# Patient Record
Sex: Female | Born: 1935 | Race: White | Hispanic: No | Marital: Single | Smoking: Never smoker
Health system: Southern US, Community
[De-identification: ages and names within clinical notes are randomized; demographics above are authoritative.]

## PROBLEM LIST (undated history)

## (undated) DIAGNOSIS — Z8489 Family history of other specified conditions: Secondary | ICD-10-CM

## (undated) DIAGNOSIS — F0390 Unspecified dementia without behavioral disturbance: Secondary | ICD-10-CM

## (undated) DIAGNOSIS — M1990 Unspecified osteoarthritis, unspecified site: Secondary | ICD-10-CM

## (undated) DIAGNOSIS — I82409 Acute embolism and thrombosis of unspecified deep veins of unspecified lower extremity: Secondary | ICD-10-CM

## (undated) DIAGNOSIS — I340 Nonrheumatic mitral (valve) insufficiency: Secondary | ICD-10-CM

## (undated) DIAGNOSIS — R112 Nausea with vomiting, unspecified: Secondary | ICD-10-CM

## (undated) DIAGNOSIS — C801 Malignant (primary) neoplasm, unspecified: Secondary | ICD-10-CM

## (undated) DIAGNOSIS — S32010A Wedge compression fracture of first lumbar vertebra, initial encounter for closed fracture: Secondary | ICD-10-CM

## (undated) DIAGNOSIS — K219 Gastro-esophageal reflux disease without esophagitis: Secondary | ICD-10-CM

## (undated) DIAGNOSIS — H3530 Unspecified macular degeneration: Secondary | ICD-10-CM

## (undated) DIAGNOSIS — I350 Nonrheumatic aortic (valve) stenosis: Secondary | ICD-10-CM

## (undated) DIAGNOSIS — S42409A Unspecified fracture of lower end of unspecified humerus, initial encounter for closed fracture: Secondary | ICD-10-CM

## (undated) DIAGNOSIS — Z9889 Other specified postprocedural states: Secondary | ICD-10-CM

## (undated) HISTORY — PX: OTHER SURGICAL HISTORY: SHX169

## (undated) HISTORY — PX: FRACTURE SURGERY: SHX138

## (undated) HISTORY — PX: HIP SURGERY: SHX245

## (undated) HISTORY — PX: COLONOSCOPY W/ BIOPSIES AND POLYPECTOMY: SHX1376

---

## 2001-06-03 ENCOUNTER — Encounter: Payer: Self-pay | Admitting: Family Medicine

## 2001-06-03 ENCOUNTER — Encounter: Admission: RE | Admit: 2001-06-03 | Discharge: 2001-06-03 | Payer: Self-pay | Admitting: Family Medicine

## 2001-06-29 ENCOUNTER — Other Ambulatory Visit: Admission: RE | Admit: 2001-06-29 | Discharge: 2001-06-29 | Payer: Self-pay | Admitting: Family Medicine

## 2007-12-28 ENCOUNTER — Encounter: Admission: RE | Admit: 2007-12-28 | Discharge: 2007-12-28 | Payer: Self-pay | Admitting: Family Medicine

## 2007-12-29 ENCOUNTER — Encounter: Admission: RE | Admit: 2007-12-29 | Discharge: 2007-12-29 | Payer: Self-pay | Admitting: Family Medicine

## 2008-09-14 ENCOUNTER — Emergency Department (HOSPITAL_COMMUNITY): Admission: EM | Admit: 2008-09-14 | Discharge: 2008-09-14 | Payer: Self-pay | Admitting: Emergency Medicine

## 2010-06-01 ENCOUNTER — Emergency Department (HOSPITAL_COMMUNITY)
Admission: EM | Admit: 2010-06-01 | Discharge: 2010-06-01 | Payer: Self-pay | Source: Home / Self Care | Admitting: Family Medicine

## 2010-08-21 LAB — POCT I-STAT, CHEM 8
Creatinine, Ser: 0.9 mg/dL (ref 0.4–1.2)
HCT: 41 % (ref 36.0–46.0)
Hemoglobin: 13.9 g/dL (ref 12.0–15.0)
Potassium: 3.5 mEq/L (ref 3.5–5.1)
Sodium: 139 mEq/L (ref 135–145)
TCO2: 23 mmol/L (ref 0–100)

## 2010-08-21 LAB — DIFFERENTIAL
Eosinophils Relative: 0 % (ref 0–5)
Lymphocytes Relative: 9 % — ABNORMAL LOW (ref 12–46)
Lymphs Abs: 0.9 10*3/uL (ref 0.7–4.0)
Monocytes Absolute: 0.5 10*3/uL (ref 0.1–1.0)

## 2010-08-21 LAB — CBC
MCHC: 34.5 g/dL (ref 30.0–36.0)
MCV: 90.8 fL (ref 78.0–100.0)
MCV: 90.9 fL (ref 78.0–100.0)
Platelets: 224 10*3/uL (ref 150–400)
Platelets: 246 10*3/uL (ref 150–400)
RDW: 12.5 % (ref 11.5–15.5)
WBC: 9.5 10*3/uL (ref 4.0–10.5)

## 2010-08-21 LAB — COMPREHENSIVE METABOLIC PANEL
ALT: 110 U/L — ABNORMAL HIGH (ref 0–35)
AST: 77 U/L — ABNORMAL HIGH (ref 0–37)
Albumin: 4 g/dL (ref 3.5–5.2)
Calcium: 8.8 mg/dL (ref 8.4–10.5)
Chloride: 103 mEq/L (ref 96–112)
Creatinine, Ser: 0.72 mg/dL (ref 0.4–1.2)
GFR calc Af Amer: >60 mL/min (ref >60)
Sodium: 138 mEq/L (ref 135–145)
Total Bilirubin: 0.6 mg/dL (ref 0.3–1.2)

## 2010-08-21 LAB — URINALYSIS, ROUTINE W REFLEX MICROSCOPIC
Hgb urine dipstick: NEGATIVE
Nitrite: NEGATIVE
Protein, ur: NEGATIVE mg/dL
Specific Gravity, Urine: 1.019 (ref 1.005–1.030)
Urobilinogen, UA: 0.2 mg/dL (ref 0.0–1.0)

## 2010-08-21 LAB — POCT CARDIAC MARKERS: Troponin i, poc: <0.05 ng/mL (ref 0.00–0.09)

## 2011-02-20 ENCOUNTER — Ambulatory Visit (INDEPENDENT_AMBULATORY_CARE_PROVIDER_SITE_OTHER): Payer: Self-pay | Admitting: Ophthalmology

## 2011-09-30 ENCOUNTER — Encounter (INDEPENDENT_AMBULATORY_CARE_PROVIDER_SITE_OTHER): Payer: Medicare Other | Admitting: Ophthalmology

## 2011-09-30 DIAGNOSIS — H33309 Unspecified retinal break, unspecified eye: Secondary | ICD-10-CM | POA: Diagnosis not present

## 2011-09-30 DIAGNOSIS — H2510 Age-related nuclear cataract, unspecified eye: Secondary | ICD-10-CM | POA: Diagnosis not present

## 2011-09-30 DIAGNOSIS — H43819 Vitreous degeneration, unspecified eye: Secondary | ICD-10-CM | POA: Diagnosis not present

## 2011-09-30 DIAGNOSIS — H40019 Open angle with borderline findings, low risk, unspecified eye: Secondary | ICD-10-CM

## 2012-01-22 DIAGNOSIS — T148XXA Other injury of unspecified body region, initial encounter: Secondary | ICD-10-CM | POA: Diagnosis not present

## 2012-03-06 DIAGNOSIS — Z1331 Encounter for screening for depression: Secondary | ICD-10-CM | POA: Diagnosis not present

## 2012-03-06 DIAGNOSIS — M940 Chondrocostal junction syndrome [Tietze]: Secondary | ICD-10-CM | POA: Diagnosis not present

## 2012-03-06 DIAGNOSIS — Z23 Encounter for immunization: Secondary | ICD-10-CM | POA: Diagnosis not present

## 2012-04-01 ENCOUNTER — Ambulatory Visit (INDEPENDENT_AMBULATORY_CARE_PROVIDER_SITE_OTHER): Payer: Medicare Other | Admitting: Ophthalmology

## 2012-04-01 DIAGNOSIS — H3530 Unspecified macular degeneration: Secondary | ICD-10-CM

## 2012-04-01 DIAGNOSIS — H33309 Unspecified retinal break, unspecified eye: Secondary | ICD-10-CM

## 2012-04-01 DIAGNOSIS — H40019 Open angle with borderline findings, low risk, unspecified eye: Secondary | ICD-10-CM

## 2012-04-02 DIAGNOSIS — H40019 Open angle with borderline findings, low risk, unspecified eye: Secondary | ICD-10-CM | POA: Diagnosis not present

## 2012-04-02 DIAGNOSIS — H3530 Unspecified macular degeneration: Secondary | ICD-10-CM | POA: Diagnosis not present

## 2012-04-02 DIAGNOSIS — H2510 Age-related nuclear cataract, unspecified eye: Secondary | ICD-10-CM | POA: Diagnosis not present

## 2012-04-02 DIAGNOSIS — H04129 Dry eye syndrome of unspecified lacrimal gland: Secondary | ICD-10-CM | POA: Diagnosis not present

## 2012-04-13 DIAGNOSIS — R071 Chest pain on breathing: Secondary | ICD-10-CM | POA: Diagnosis not present

## 2012-07-02 DIAGNOSIS — H3530 Unspecified macular degeneration: Secondary | ICD-10-CM | POA: Diagnosis not present

## 2012-07-02 DIAGNOSIS — H2510 Age-related nuclear cataract, unspecified eye: Secondary | ICD-10-CM | POA: Diagnosis not present

## 2012-07-02 DIAGNOSIS — H04129 Dry eye syndrome of unspecified lacrimal gland: Secondary | ICD-10-CM | POA: Diagnosis not present

## 2012-07-02 DIAGNOSIS — H40019 Open angle with borderline findings, low risk, unspecified eye: Secondary | ICD-10-CM | POA: Diagnosis not present

## 2012-12-18 ENCOUNTER — Other Ambulatory Visit: Payer: Self-pay | Admitting: Family Medicine

## 2012-12-18 ENCOUNTER — Ambulatory Visit
Admission: RE | Admit: 2012-12-18 | Discharge: 2012-12-18 | Disposition: A | Discharge status: Home / Self Care | Payer: Medicare Other | Source: Ambulatory Visit | Attending: Family Medicine | Admitting: Family Medicine

## 2012-12-18 DIAGNOSIS — D239 Other benign neoplasm of skin, unspecified: Secondary | ICD-10-CM | POA: Diagnosis not present

## 2012-12-18 DIAGNOSIS — R634 Abnormal weight loss: Secondary | ICD-10-CM

## 2012-12-18 DIAGNOSIS — R630 Anorexia: Secondary | ICD-10-CM

## 2012-12-18 DIAGNOSIS — Z136 Encounter for screening for cardiovascular disorders: Secondary | ICD-10-CM | POA: Diagnosis not present

## 2012-12-18 DIAGNOSIS — Z23 Encounter for immunization: Secondary | ICD-10-CM | POA: Diagnosis not present

## 2012-12-18 DIAGNOSIS — J449 Chronic obstructive pulmonary disease, unspecified: Secondary | ICD-10-CM | POA: Diagnosis not present

## 2012-12-29 DIAGNOSIS — H40019 Open angle with borderline findings, low risk, unspecified eye: Secondary | ICD-10-CM | POA: Diagnosis not present

## 2012-12-29 DIAGNOSIS — H353190 Nonexudative age-related macular degeneration, unspecified eye, stage unspecified: Secondary | ICD-10-CM | POA: Diagnosis not present

## 2012-12-29 DIAGNOSIS — H2589 Other age-related cataract: Secondary | ICD-10-CM | POA: Diagnosis not present

## 2013-01-04 DIAGNOSIS — R634 Abnormal weight loss: Secondary | ICD-10-CM | POA: Diagnosis not present

## 2013-02-08 DIAGNOSIS — H269 Unspecified cataract: Secondary | ICD-10-CM | POA: Diagnosis not present

## 2013-02-08 DIAGNOSIS — H2589 Other age-related cataract: Secondary | ICD-10-CM | POA: Diagnosis not present

## 2013-02-08 DIAGNOSIS — H3530 Unspecified macular degeneration: Secondary | ICD-10-CM | POA: Diagnosis not present

## 2013-05-31 DIAGNOSIS — L570 Actinic keratosis: Secondary | ICD-10-CM | POA: Diagnosis not present

## 2013-05-31 DIAGNOSIS — B079 Viral wart, unspecified: Secondary | ICD-10-CM | POA: Diagnosis not present

## 2013-08-06 DIAGNOSIS — M549 Dorsalgia, unspecified: Secondary | ICD-10-CM | POA: Diagnosis not present

## 2013-08-24 DIAGNOSIS — R030 Elevated blood-pressure reading, without diagnosis of hypertension: Secondary | ICD-10-CM | POA: Diagnosis not present

## 2013-08-24 DIAGNOSIS — Z0000 Encounter for general adult medical examination without abnormal findings: Secondary | ICD-10-CM | POA: Diagnosis not present

## 2013-08-24 DIAGNOSIS — Z23 Encounter for immunization: Secondary | ICD-10-CM | POA: Diagnosis not present

## 2013-12-04 ENCOUNTER — Emergency Department (HOSPITAL_COMMUNITY): Payer: Medicare Other

## 2013-12-04 ENCOUNTER — Inpatient Hospital Stay (HOSPITAL_COMMUNITY)
Admission: EM | Admit: 2013-12-04 | Discharge: 2013-12-09 | DRG: 500 | Disposition: A | Discharge status: Rehabilitation Facility | Payer: Medicare Other | Attending: Internal Medicine | Admitting: Internal Medicine

## 2013-12-04 ENCOUNTER — Encounter (HOSPITAL_COMMUNITY): Payer: Self-pay | Admitting: Emergency Medicine

## 2013-12-04 DIAGNOSIS — M25559 Pain in unspecified hip: Secondary | ICD-10-CM | POA: Diagnosis not present

## 2013-12-04 DIAGNOSIS — S72033A Displaced midcervical fracture of unspecified femur, initial encounter for closed fracture: Secondary | ICD-10-CM | POA: Diagnosis not present

## 2013-12-04 DIAGNOSIS — Z79899 Other long term (current) drug therapy: Secondary | ICD-10-CM | POA: Diagnosis not present

## 2013-12-04 DIAGNOSIS — Z7982 Long term (current) use of aspirin: Secondary | ICD-10-CM | POA: Diagnosis not present

## 2013-12-04 DIAGNOSIS — S52201A Unspecified fracture of shaft of right ulna, initial encounter for closed fracture: Secondary | ICD-10-CM

## 2013-12-04 DIAGNOSIS — S52021A Displaced fracture of olecranon process without intraarticular extension of right ulna, initial encounter for closed fracture: Secondary | ICD-10-CM

## 2013-12-04 DIAGNOSIS — M545 Low back pain, unspecified: Secondary | ICD-10-CM | POA: Diagnosis not present

## 2013-12-04 DIAGNOSIS — Z8249 Family history of ischemic heart disease and other diseases of the circulatory system: Secondary | ICD-10-CM | POA: Diagnosis not present

## 2013-12-04 DIAGNOSIS — T07XXXA Unspecified multiple injuries, initial encounter: Secondary | ICD-10-CM

## 2013-12-04 DIAGNOSIS — S0003XA Contusion of scalp, initial encounter: Secondary | ICD-10-CM | POA: Diagnosis not present

## 2013-12-04 DIAGNOSIS — M79609 Pain in unspecified limb: Secondary | ICD-10-CM | POA: Diagnosis not present

## 2013-12-04 DIAGNOSIS — T1490XA Injury, unspecified, initial encounter: Secondary | ICD-10-CM | POA: Diagnosis not present

## 2013-12-04 DIAGNOSIS — R9431 Abnormal electrocardiogram [ECG] [EKG]: Secondary | ICD-10-CM | POA: Diagnosis not present

## 2013-12-04 DIAGNOSIS — S79919A Unspecified injury of unspecified hip, initial encounter: Secondary | ICD-10-CM | POA: Diagnosis not present

## 2013-12-04 DIAGNOSIS — S1093XA Contusion of unspecified part of neck, initial encounter: Secondary | ICD-10-CM | POA: Diagnosis present

## 2013-12-04 DIAGNOSIS — Z95818 Presence of other cardiac implants and grafts: Secondary | ICD-10-CM | POA: Diagnosis not present

## 2013-12-04 DIAGNOSIS — W19XXXA Unspecified fall, initial encounter: Secondary | ICD-10-CM | POA: Diagnosis not present

## 2013-12-04 DIAGNOSIS — R4181 Age-related cognitive decline: Secondary | ICD-10-CM | POA: Diagnosis present

## 2013-12-04 DIAGNOSIS — D649 Anemia, unspecified: Secondary | ICD-10-CM | POA: Diagnosis not present

## 2013-12-04 DIAGNOSIS — S72001A Fracture of unspecified part of neck of right femur, initial encounter for closed fracture: Secondary | ICD-10-CM

## 2013-12-04 DIAGNOSIS — S72109A Unspecified trochanteric fracture of unspecified femur, initial encounter for closed fracture: Secondary | ICD-10-CM | POA: Diagnosis not present

## 2013-12-04 DIAGNOSIS — S0083XA Contusion of other part of head, initial encounter: Secondary | ICD-10-CM | POA: Diagnosis present

## 2013-12-04 DIAGNOSIS — R51 Headache: Secondary | ICD-10-CM | POA: Diagnosis not present

## 2013-12-04 DIAGNOSIS — Z881 Allergy status to other antibiotic agents status: Secondary | ICD-10-CM | POA: Diagnosis not present

## 2013-12-04 DIAGNOSIS — S52209A Unspecified fracture of shaft of unspecified ulna, initial encounter for closed fracture: Secondary | ICD-10-CM | POA: Diagnosis not present

## 2013-12-04 DIAGNOSIS — S52009A Unspecified fracture of upper end of unspecified ulna, initial encounter for closed fracture: Secondary | ICD-10-CM | POA: Diagnosis not present

## 2013-12-04 DIAGNOSIS — R110 Nausea: Secondary | ICD-10-CM | POA: Diagnosis not present

## 2013-12-04 DIAGNOSIS — S72009A Fracture of unspecified part of neck of unspecified femur, initial encounter for closed fracture: Secondary | ICD-10-CM | POA: Diagnosis not present

## 2013-12-04 DIAGNOSIS — S52023A Displaced fracture of olecranon process without intraarticular extension of unspecified ulna, initial encounter for closed fracture: Secondary | ICD-10-CM | POA: Diagnosis not present

## 2013-12-04 DIAGNOSIS — Y92009 Unspecified place in unspecified non-institutional (private) residence as the place of occurrence of the external cause: Secondary | ICD-10-CM

## 2013-12-04 DIAGNOSIS — T148XXA Other injury of unspecified body region, initial encounter: Secondary | ICD-10-CM | POA: Diagnosis not present

## 2013-12-04 DIAGNOSIS — S42409A Unspecified fracture of lower end of unspecified humerus, initial encounter for closed fracture: Secondary | ICD-10-CM | POA: Diagnosis present

## 2013-12-04 DIAGNOSIS — R079 Chest pain, unspecified: Secondary | ICD-10-CM | POA: Diagnosis not present

## 2013-12-04 DIAGNOSIS — S0100XA Unspecified open wound of scalp, initial encounter: Secondary | ICD-10-CM | POA: Diagnosis present

## 2013-12-04 DIAGNOSIS — IMO0002 Reserved for concepts with insufficient information to code with codable children: Secondary | ICD-10-CM | POA: Diagnosis present

## 2013-12-04 DIAGNOSIS — S0990XA Unspecified injury of head, initial encounter: Secondary | ICD-10-CM | POA: Diagnosis not present

## 2013-12-04 DIAGNOSIS — D72829 Elevated white blood cell count, unspecified: Secondary | ICD-10-CM | POA: Diagnosis not present

## 2013-12-04 DIAGNOSIS — S42409B Unspecified fracture of lower end of unspecified humerus, initial encounter for open fracture: Secondary | ICD-10-CM | POA: Diagnosis not present

## 2013-12-04 DIAGNOSIS — W010XXA Fall on same level from slipping, tripping and stumbling without subsequent striking against object, initial encounter: Secondary | ICD-10-CM | POA: Diagnosis present

## 2013-12-04 DIAGNOSIS — M810 Age-related osteoporosis without current pathological fracture: Secondary | ICD-10-CM | POA: Diagnosis present

## 2013-12-04 DIAGNOSIS — Z886 Allergy status to analgesic agent status: Secondary | ICD-10-CM

## 2013-12-04 DIAGNOSIS — G8918 Other acute postprocedural pain: Secondary | ICD-10-CM | POA: Diagnosis not present

## 2013-12-04 DIAGNOSIS — Z5189 Encounter for other specified aftercare: Secondary | ICD-10-CM | POA: Diagnosis not present

## 2013-12-04 DIAGNOSIS — Z8049 Family history of malignant neoplasm of other genital organs: Secondary | ICD-10-CM | POA: Diagnosis not present

## 2013-12-04 HISTORY — DX: Unspecified fracture of lower end of unspecified humerus, initial encounter for closed fracture: S42.409A

## 2013-12-04 HISTORY — DX: Family history of other specified conditions: Z84.89

## 2013-12-04 HISTORY — DX: Unspecified osteoarthritis, unspecified site: M19.90

## 2013-12-04 LAB — URINALYSIS, ROUTINE W REFLEX MICROSCOPIC
Bilirubin Urine: NEGATIVE
GLUCOSE, UA: NEGATIVE mg/dL
HGB URINE DIPSTICK: NEGATIVE
KETONES UR: 15 mg/dL — AB
LEUKOCYTES UA: NEGATIVE
Nitrite: NEGATIVE
PROTEIN: NEGATIVE mg/dL
Specific Gravity, Urine: 1.011 (ref 1.005–1.030)
UROBILINOGEN UA: 1 mg/dL (ref 0.0–1.0)
pH: 8 (ref 5.0–8.0)

## 2013-12-04 LAB — BASIC METABOLIC PANEL
Anion gap: 15 (ref 5–15)
BUN: 9 mg/dL (ref 6–23)
CALCIUM: 9.4 mg/dL (ref 8.4–10.5)
CO2: 27 mEq/L (ref 19–32)
CREATININE: 0.57 mg/dL (ref 0.50–1.10)
Chloride: 94 mEq/L — ABNORMAL LOW (ref 96–112)
GFR, EST NON AFRICAN AMERICAN: 83 mL/min — AB (ref >90)
GLUCOSE: 107 mg/dL — AB (ref 70–99)
POTASSIUM: 3.8 meq/L (ref 3.7–5.3)
Sodium: 136 mEq/L — ABNORMAL LOW (ref 137–147)

## 2013-12-04 LAB — CBC WITH DIFFERENTIAL/PLATELET
BASOS PCT: 0 % (ref 0–1)
Basophils Absolute: 0 10*3/uL (ref 0.0–0.1)
EOS ABS: 0 10*3/uL (ref 0.0–0.7)
EOS PCT: 0 % (ref 0–5)
HCT: 37.9 % (ref 36.0–46.0)
Hemoglobin: 12.8 g/dL (ref 12.0–15.0)
Lymphocytes Relative: 6 % — ABNORMAL LOW (ref 12–46)
Lymphs Abs: 1.1 10*3/uL (ref 0.7–4.0)
MCH: 30.5 pg (ref 26.0–34.0)
MCHC: 33.8 g/dL (ref 30.0–36.0)
MCV: 90.5 fL (ref 78.0–100.0)
Monocytes Absolute: 1.7 10*3/uL — ABNORMAL HIGH (ref 0.1–1.0)
Monocytes Relative: 10 % (ref 3–12)
NEUTROS PCT: 84 % — AB (ref 43–77)
Neutro Abs: 15.2 10*3/uL — ABNORMAL HIGH (ref 1.7–7.7)
PLATELETS: 225 10*3/uL (ref 150–400)
RBC: 4.19 MIL/uL (ref 3.87–5.11)
RDW: 12.7 % (ref 11.5–15.5)
WBC: 18.1 10*3/uL — ABNORMAL HIGH (ref 4.0–10.5)

## 2013-12-04 MED ORDER — ENOXAPARIN SODIUM 30 MG/0.3ML ~~LOC~~ SOLN
30.0000 mg | Freq: Every day | SUBCUTANEOUS | Status: DC
  Administered 2013-12-05: 30 mg via SUBCUTANEOUS
  Filled 2013-12-04: qty 0.3

## 2013-12-04 MED ORDER — ZOLPIDEM TARTRATE 5 MG PO TABS
5.0000 mg | ORAL_TABLET | Freq: Every evening | ORAL | Status: AC | PRN
  Administered 2013-12-08: 5 mg via ORAL
  Filled 2013-12-04: qty 1

## 2013-12-04 MED ORDER — MORPHINE SULFATE 2 MG/ML IJ SOLN: 0.5000 mg | INTRAMUSCULAR | Status: DC | PRN

## 2013-12-04 MED ORDER — OXYCODONE HCL 5 MG PO TABS
5.0000 mg | ORAL_TABLET | ORAL | Status: DC | PRN
  Administered 2013-12-06 – 2013-12-07 (×4): 5 mg via ORAL
  Filled 2013-12-04 (×4): qty 1

## 2013-12-04 MED ORDER — OXYCODONE-ACETAMINOPHEN 5-325 MG PO TABS: 1.0000 | ORAL_TABLET | Freq: Three times a day (TID) | ORAL | Status: DC | PRN

## 2013-12-04 MED ORDER — ACETAMINOPHEN 325 MG PO TABS
650.0000 mg | ORAL_TABLET | Freq: Once | ORAL | Status: AC
  Administered 2013-12-04: 650 mg via ORAL
  Filled 2013-12-04: qty 2

## 2013-12-04 MED ORDER — ACETAMINOPHEN 325 MG PO TABS
650.0000 mg | ORAL_TABLET | Freq: Four times a day (QID) | ORAL | Status: DC | PRN
  Administered 2013-12-05 – 2013-12-09 (×5): 650 mg via ORAL
  Filled 2013-12-04 (×5): qty 2

## 2013-12-04 NOTE — ED Provider Notes (Signed)
Patient's care was taken over from Dr. Christy Gentles. Patient is status post a mechanical fall. She fracture of the proximal ulna.  She has also had ongoing pain to right hip and has been unable to ambulate.  X-rays done of the hip did not show a fracture.  Dr. Christy Gentles consulted PT for evaluation, but we never got a response back from them.  I spoke with the hospitalist regarding admission, it was requested to do a hip CT which was appropriate.  This showed a non-displaced fracture of the greater trochanter.  I have consulted Dr. Tonita Cong with ortho who is in surgery, still awaiting a call back from him.  Dr. Tonita Cong has reviewed the x-ray. This is a non-operative fracture. He advised that patient can be weightbearing with a walker although she likely need physical therapy. We will admit her under the hip fracture protocol is. I consult with Dr. Hal Hope who will admit the patient.  Malvin Johns, MD 12/04/13 2131

## 2013-12-04 NOTE — ED Notes (Signed)
Admitting MD at the bedside.  

## 2013-12-04 NOTE — Progress Notes (Signed)
Orthopedic Tech Progress Note Patient Details:  KALIN AMRHEIN [birth year] [id number]  Ortho Devices Type of Ortho Device: Ace wrap;Long arm splint;Arm sling Ortho Device/Splint Interventions: Application   Cammer, Theodoro Parma 12/04/2013, 1:21 PM

## 2013-12-04 NOTE — ED Notes (Signed)
TRimaine, NT at the bedside.

## 2013-12-04 NOTE — H&P (Signed)
[facility] History and Physical  Vanessa Pace TMA:[id number] DOB: 1926-08-18 DOA: 12/04/2013  Referring physician: ER physician. PCP: No PCP Per Patient   Chief Complaint: Fall.  HPI: Vanessa Pace is a 80 y.o. female with no significant past medical history had a fall of a tripping over her cat in her house. Patient did not lose consciousness but did hit her head. Patient had developed a small laceration on the scalp over the posterior aspect. Patient had pain in her right upper extremity and hip area and x-rays reveal proximal ulnar fracture with right hip fracture. Patient was ambulated but due to pain was unable to do so. On-call orthopedic surgeon Dr. Marlou Sa was consulted by the ED physician and Dr. Marlou Sa felt that this would require nonoperative management. Patient has been admitted for pain management and physical therapy. Patient has had a small laceration on the scalp on the occipital area. Patient states she had a tetanus toxoid recently with the last 5 years. Denies any chest pain or shortness of breath nausea vomiting abdominal pain diarrhea.   Review of Systems: As presented in the history of presenting illness, rest negative.  History reviewed. No pertinent past medical history. Past Surgical History  Procedure Laterality Date  . Fracture surgery      right hip  . Femur surgery     Social History:  reports that she has never smoked. She does not have any smokeless tobacco history on file. She reports that she does not drink alcohol. Her drug history is not on file. Where does patient live home. Can patient participate in ADLs? Yes.  Allergies  Allergen Reactions  . Adhesive [Tape] Other (See Comments)    Tears skin  . Amoxicillin Rash  . Hydrocodone Nausea Only    Family History:  Family History  Problem Relation Age of Onset  . CAD Father   . CAD Son   . Cervical cancer Daughter       Prior to Admission medications   Medication Sig Start Date End Date  Taking? Authorizing Provider  aspirin EC 325 MG tablet Take 162.5 mg by mouth daily.   Yes Historical Provider, MD  DiphenhydrAMINE HCl, Sleep, (UNISOM SLEEPGELS) 50 MG CAPS Take 50 mg by mouth at bedtime.   Yes Historical Provider, MD  oxyCODONE-acetaminophen (PERCOCET/ROXICET) 5-325 MG per tablet Take 1 tablet by mouth every 8 (eight) hours as needed for severe pain. 12/04/13   Sharyon Cable, MD    Physical Exam: Marcelline Mates:   12/04/13 2115 12/04/13 2145 12/04/13 2200 12/04/13 2244  BP: 131/70  133/78   Pulse: 100 102 102   Temp:      TempSrc:      Resp: 24 16 17    Height:    5' (1.524 m)  Weight:    29 kg (63 lb 14.9 oz)  SpO2: 93% 94% 95%      General:  Moderately built and nourished.  Eyes: Anicteric no pallor.  ENT: No discharge from ears eyes nose mouth.  Neck: No mass felt.  Cardiovascular: S1-S2 heard.  Respiratory: No rhonchi crepitations.  Abdomen: Soft nontender bowel sounds present. No guarding rigidity.  Skin: Scalp laceration.  Musculoskeletal: No edema. There is pain on moving her right hip. Patient has sling on the right upper extremity.  Psychiatric: Appears normal.  Neurologic: Alert and oriented to time place and person. Moves all extremities.  Labs on Admission:  Basic Metabolic Panel:  Recent Labs Lab 12/04/13 1416  NA 136*  K 3.8  CL 94*  CO2 27  GLUCOSE 107*  BUN 9  CREATININE 0.57  CALCIUM 9.4   Liver Function Tests: No results found for this basename: AST, ALT, ALKPHOS, BILITOT, PROT, ALBUMIN,  in the last 168 hours No results found for this basename: LIPASE, AMYLASE,  in the last 168 hours No results found for this basename: AMMONIA,  in the last 168 hours CBC:  Recent Labs Lab 12/04/13 1416  WBC 18.1*  NEUTROABS 15.2*  HGB 12.8  HCT 37.9  MCV 90.5  PLT 225   Cardiac Enzymes: No results found for this basename: CKTOTAL, CKMB, CKMBINDEX, TROPONINI,  in the last 168 hours  BNP (last 3 results) No results found  for this basename: PROBNP,  in the last 8760 hours CBG: No results found for this basename: GLUCAP,  in the last 168 hours  Radiological Exams on Admission: Dg Lumbar Spine Complete  12/04/2013   CLINICAL DATA:  Pain post trauma  EXAM: LUMBAR SPINE - COMPLETE 4+ VIEW  COMPARISON:  None.  FINDINGS: Frontal, lateral, spot lumbosacral lateral, and bilateral oblique views were obtained. There are 5 non-rib-bearing lumbar type vertebral bodies. Bones are osteoporotic. There is marked anterior wedging of the T12 vertebral body. There is milder anterior wedging at L1 and L3. There is also anterior wedging at T8, T9, and T10. There is no spondylolisthesis. There is moderately severe disc space narrowing at L5-S1. There is moderate narrowing at all other levels in the lumbar region. There is facet osteoarthritic change at all levels bilaterally. There is atherosclerotic change throughout the aorta.  IMPRESSION: Multiple age uncertain lumbar and lower thoracic spine fractures. Bones diffusely osteoporotic. Extensive multilevel osteoarthritic change. Extensive atherosclerotic change.   Electronically Signed   By: Lowella Grip M.D.   On: 12/04/2013 12:09   Dg Elbow 2 Views Right  12/04/2013   CLINICAL DATA:  Pain post trauma  EXAM: RIGHT ELBOW - 2 VIEW  COMPARISON:  None.  FINDINGS: Frontal and lateral views were obtained. There is a fracture of the proximal ulna with displacement of fracture fragments in this area by 1.3 cm. There is marked surrounding soft tissue swelling with extensive joint effusion. No other fractures. No dislocation. Bones are osteoporotic.  IMPRESSION: Displaced fracture proximal ulna with marked soft tissue swelling and effusion. No dislocation apparent.   Electronically Signed   By: Lowella Grip M.D.   On: 12/04/2013 12:04   Dg Hip Complete Right  12/04/2013   CLINICAL DATA:  Pain post trauma  EXAM: RIGHT HIP - COMPLETE 2+ VIEW  COMPARISON:  None.  FINDINGS: Frontal pelvis as well  as frontal and lateral right hip images were obtained. There are screws transfixing an old fracture in the subcapital femoral region on the right with tips of the screws in the proximal femoral head. No acute fracture dislocation. There is mild symmetric narrowing of both hip joints. Bones are osteoporotic.  IMPRESSION: Prior screw fixation on the right. No acute fracture or dislocation. Mild symmetric narrowing of both hip joints. Bones osteoporotic.   Electronically Signed   By: Lowella Grip M.D.   On: 12/04/2013 15:13   Ct Head Wo Contrast  12/04/2013   CLINICAL DATA:  Pain post trauma  EXAM: CT HEAD WITHOUT CONTRAST  TECHNIQUE: Contiguous axial images were obtained from the base of the skull through the vertex without intravenous contrast.  COMPARISON:  Sep 14, 2008  FINDINGS: There is mild generalized atrophy. There is no mass, hemorrhage, extra-axial fluid  collection, or midline shift. There is small vessel disease throughout the centra semiovale bilaterally. There is evidence of a prior small lacunar infarct in the head of the caudate nucleus on the left, stable. There is no acute appearing infarct. No new gray-white compartment lesion is identified.  Bony calvarium appears intact. The mastoid air cells are clear. There is a right parietal scalp hematoma.  IMPRESSION: Atrophy with small vessel disease, stable. No intracranial mass, hemorrhage, or extra-axial fluid. There is a right parietal scalp hematoma.   Electronically Signed   By: Lowella Grip M.D.   On: 12/04/2013 12:11   Ct Hip Right Wo Contrast  12/04/2013   CLINICAL DATA:  80 year old female right hip pain following fall. History of previous right hip fracture.  EXAM: CT OF THE RIGHT HIP WITHOUT CONTRAST  TECHNIQUE: Multidetector CT imaging was performed according to the standard protocol. Multiplanar CT image reconstructions were also generated.  COMPARISON:  None.  FINDINGS: A nondisplaced horizontal fracture of the greater  trochanteric is noted.  3 surgical screws within the proximal right femur traversed A remote femoral neck fracture.  There is no evidence of subluxation, dislocation or complicating hardware features.  Diffuse osteopenia and degenerative changes within the right hip noted.  Degenerative changes in the lower lumbar spine are also identified.  IMPRESSION: Nondisplaced horizontal fracture of the greater trochanter.   Electronically Signed   By: Hassan Rowan M.D.   On: 12/04/2013 19:13     Assessment/Plan Principal Problem:   Hip fracture Active Problems:   Closed fracture of right ulna   Leucocytosis   1. Right hip fracture and right ulnar fracture status post mechanical fall - patient has been in bed for pain management physical therapy and possible rehabilitation placement. Dr. Marlou Sa to see in consult. Patient prefers Tylenol for pain medications. 2. Leukocytosis - probably reactionary. Urinalysis is pending.    Code Status: Full code.  Family Communication: Patient's daughter at the bedside.  Disposition Plan: Admit to inpatient.    KAKRAKANDY,ARSHAD N. [facility] Pager [phone].  If 7PM-7AM, please contact night-coverage [email or web address] Password [facility] 12/04/2013, 10:47 PM

## 2013-12-04 NOTE — ED Provider Notes (Signed)
CSN: [id number]     Arrival date & time 12/04/13  1010 History   First MD Initiated Contact with Patient 12/04/13 1031     Chief Complaint  Patient presents with  . Fall     Patient is a 80 y.o. female presenting with fall. The history is provided by the patient.  Fall This is a new problem. The current episode started 1 to 2 hours ago. The problem occurs constantly. Associated symptoms include headaches. Pertinent negatives include no chest pain, no abdominal pain and no shortness of breath. Nothing relieves the symptoms. She has tried rest for the symptoms.  Pt reports she fell off of  Porch while trying to feed her cats She denies weakness/dizziness prior to fall No LOC She does reports HA She also has low back pain and right elbow pain No neck pain No cp/sob No abd pain   PMH - none Tetanus is UTD Past Surgical History  Procedure Laterality Date  . Fracture surgery      right hip   No family history on file. History  Substance Use Topics  . Smoking status: Not on file  . Smokeless tobacco: Not on file  . Alcohol Use: Not on file   OB History   Grav Para Term Preterm Abortions TAB SAB Ect Mult Living                 Review of Systems  Respiratory: Negative for shortness of breath.   Cardiovascular: Negative for chest pain.  Gastrointestinal: Negative for abdominal pain.  Neurological: Positive for headaches.  All other systems reviewed and are negative.     Allergies  Adhesive; Amoxicillin; and Hydrocodone  Home Medications   Prior to Admission medications   Medication Sig Start Date End Date Taking? Authorizing Provider  aspirin EC 325 MG tablet Take 162.5 mg by mouth daily.   Yes Historical Provider, MD  DiphenhydrAMINE HCl, Sleep, (UNISOM SLEEPGELS) 50 MG CAPS Take 50 mg by mouth at bedtime.   Yes Historical Provider, MD   BP 154/79  Pulse 93  Temp(Src) 97.7 F (36.5 C) (Oral)  Resp 16  Ht 5' (1.524 m)  Wt 89 lb (40.37 kg)  BMI 17.38 kg/m2   SpO2 92% Physical Exam CONSTITUTIONAL: elderly, frail HEAD: abrasions/hematoma to scalp EYES: EOMI ENMT: No evidence of facial/nasal trauma NECK: supple no meningeal signs SPINE:no cervical/thoracic tenderness.  +lumbar tenderness.  No bruising/crepitance/stepoffs noted to spine CV: S1/S2 noted, no murmurs/rubs/gallops noted LUNGS: Lungs are clear to auscultation bilaterally, no apparent distress Chest - no tenderness ABDOMEN: soft, nontender, no rebound or guarding GU:no cva tenderness NEURO: Pt is awake/alert, moves all extremitiesx4, GCS 15 EXTREMITIES: pulses normal, full ROM.  Tenderness/swelling/abrasions to right elbow.   All other extremities/joints palpated/ranged and nontender SKIN: warm, color normal PSYCH: no abnormalities of mood noted  ED Course  Procedures   SPLINT APPLICATION Date/Time: 9:32 PM Authorized by: Sharyon Cable Consent: Verbal consent obtained. Risks and benefits: risks, benefits and alternatives were discussed Consent given by: patient Splint applied by: orthopedic technician Location details: right arm Splint type: posterior long arm Supplies used: fiberglass Post-procedure: The splinted body part was neurovascularly unchanged following the procedure. Patient tolerance: Patient tolerated the procedure well with no immediate complications.     12:55 PM D/w dr Grandville Silos on call for hand He requests posterior splint and will call her for followup 2:12 PM Pt now reporting pain in right hip after fall which is new and has difficulty walking will obtain  xray and labs 3:35 PM Hip imaging negative Pt unsteady on her feet and fall risk I spoke to [facility] dr Verlon Au He requests PT evaluation prior to admission 4:32 PM Signed out to dr Tamera Punt pending PT evaluation If fails PT may need admission    Imaging Review Dg Lumbar Spine Complete  12/04/2013   CLINICAL DATA:  Pain post trauma  EXAM: LUMBAR SPINE - COMPLETE 4+ VIEW  COMPARISON:  None.   FINDINGS: Frontal, lateral, spot lumbosacral lateral, and bilateral oblique views were obtained. There are 5 non-rib-bearing lumbar type vertebral bodies. Bones are osteoporotic. There is marked anterior wedging of the T12 vertebral body. There is milder anterior wedging at L1 and L3. There is also anterior wedging at T8, T9, and T10. There is no spondylolisthesis. There is moderately severe disc space narrowing at L5-S1. There is moderate narrowing at all other levels in the lumbar region. There is facet osteoarthritic change at all levels bilaterally. There is atherosclerotic change throughout the aorta.  IMPRESSION: Multiple age uncertain lumbar and lower thoracic spine fractures. Bones diffusely osteoporotic. Extensive multilevel osteoarthritic change. Extensive atherosclerotic change.   Electronically Signed   By: Lowella Grip M.D.   On: 12/04/2013 12:09   Dg Elbow 2 Views Right  12/04/2013   CLINICAL DATA:  Pain post trauma  EXAM: RIGHT ELBOW - 2 VIEW  COMPARISON:  None.  FINDINGS: Frontal and lateral views were obtained. There is a fracture of the proximal ulna with displacement of fracture fragments in this area by 1.3 cm. There is marked surrounding soft tissue swelling with extensive joint effusion. No other fractures. No dislocation. Bones are osteoporotic.  IMPRESSION: Displaced fracture proximal ulna with marked soft tissue swelling and effusion. No dislocation apparent.   Electronically Signed   By: Lowella Grip M.D.   On: 12/04/2013 12:04   Ct Head Wo Contrast  12/04/2013   CLINICAL DATA:  Pain post trauma  EXAM: CT HEAD WITHOUT CONTRAST  TECHNIQUE: Contiguous axial images were obtained from the base of the skull through the vertex without intravenous contrast.  COMPARISON:  Sep 14, 2008  FINDINGS: There is mild generalized atrophy. There is no mass, hemorrhage, extra-axial fluid collection, or midline shift. There is small vessel disease throughout the centra semiovale bilaterally.  There is evidence of a prior small lacunar infarct in the head of the caudate nucleus on the left, stable. There is no acute appearing infarct. No new gray-white compartment lesion is identified.  Bony calvarium appears intact. The mastoid air cells are clear. There is a right parietal scalp hematoma.  IMPRESSION: Atrophy with small vessel disease, stable. No intracranial mass, hemorrhage, or extra-axial fluid. There is a right parietal scalp hematoma.   Electronically Signed   By: Lowella Grip M.D.   On: 12/04/2013 12:11     EKG Interpretation   Date/Time:  Saturday December 04 2013 10:19:49 EDT Ventricular Rate:  95 PR Interval:  145 QRS Duration: 75 QT Interval:  321 QTC Calculation: 403 R Axis:   -16 Text Interpretation:  Sinus rhythm LAE, consider biatrial enlargement  Borderline left axis deviation Borderline T wave abnormalities No  significant change since last tracing Confirmed by Christy Gentles  MD, Elenore Rota  [phone]) on 12/04/2013 10:33:06 AM      MDM   Final diagnoses:  Minor head injury without loss of consciousness, initial encounter  Closed fracture of right ulna, initial encounter  Abrasions of multiple sites  Compression fracture    xrays reviewed and considered Nursing notes  including past medical history and social history reviewed and considered in documentation     Sharyon Cable, MD 12/04/13 [phone]

## 2013-12-04 NOTE — ED Notes (Signed)
Patient returned from CT

## 2013-12-04 NOTE — ED Notes (Addendum)
Paged ortho to put posterior right arm splint on pt's right elbow.

## 2013-12-04 NOTE — ED Notes (Signed)
Pt taken out of [facility] by Dr. Christy Gentles. Pt c/o tenderness with palpation to lower mid back. No bruising/swelling seen to back.

## 2013-12-04 NOTE — ED Notes (Signed)
Patient returned from X-ray 

## 2013-12-04 NOTE — ED Notes (Signed)
Patient transported by [facility] from home.  Patient was going out to feed the cats and fell down 2-3 steps.  Patient complaint of hematoma to the right side of head and right elbow pain.  EMS reports blood pressure of 180/100, CBG-119 and SR on monitor.

## 2013-12-04 NOTE — ED Notes (Signed)
Patient away at [facility] upon arrival to the floor.

## 2013-12-04 NOTE — ED Notes (Signed)
Patient placed back in room after Xray.

## 2013-12-04 NOTE — ED Notes (Signed)
Patient complains of left leg cramp. Patient given heating pack and daughters at the bedside.

## 2013-12-05 ENCOUNTER — Encounter (HOSPITAL_COMMUNITY): Payer: Self-pay | Admitting: Anesthesiology

## 2013-12-05 DIAGNOSIS — S0990XA Unspecified injury of head, initial encounter: Secondary | ICD-10-CM

## 2013-12-05 LAB — COMPREHENSIVE METABOLIC PANEL
ALK PHOS: 103 U/L (ref 39–117)
ALT: 17 U/L (ref 0–35)
AST: 21 U/L (ref 0–37)
Albumin: 3.4 g/dL — ABNORMAL LOW (ref 3.5–5.2)
Anion gap: 13 (ref 5–15)
BUN: 14 mg/dL (ref 6–23)
CO2: 23 mEq/L (ref 19–32)
Calcium: 8.9 mg/dL (ref 8.4–10.5)
Chloride: 98 mEq/L (ref 96–112)
Creatinine, Ser: 0.6 mg/dL (ref 0.50–1.10)
GFR calc non Af Amer: 81 mL/min — ABNORMAL LOW (ref >90)
Glucose, Bld: 94 mg/dL (ref 70–99)
Potassium: 4.1 mEq/L (ref 3.7–5.3)
Sodium: 134 mEq/L — ABNORMAL LOW (ref 137–147)
TOTAL PROTEIN: 6.5 g/dL (ref 6.0–8.3)
Total Bilirubin: 0.7 mg/dL (ref 0.3–1.2)

## 2013-12-05 LAB — CBC WITH DIFFERENTIAL/PLATELET
BASOS PCT: 0 % (ref 0–1)
Basophils Absolute: 0 10*3/uL (ref 0.0–0.1)
EOS ABS: 0.1 10*3/uL (ref 0.0–0.7)
EOS PCT: 1 % (ref 0–5)
HCT: 34.4 % — ABNORMAL LOW (ref 36.0–46.0)
Hemoglobin: 11.6 g/dL — ABNORMAL LOW (ref 12.0–15.0)
LYMPHS ABS: 1.1 10*3/uL (ref 0.7–4.0)
Lymphocytes Relative: 10 % — ABNORMAL LOW (ref 12–46)
MCH: 30.4 pg (ref 26.0–34.0)
MCHC: 33.7 g/dL (ref 30.0–36.0)
MCV: 90.1 fL (ref 78.0–100.0)
MONOS PCT: 12 % (ref 3–12)
Monocytes Absolute: 1.4 10*3/uL — ABNORMAL HIGH (ref 0.1–1.0)
NEUTROS PCT: 77 % (ref 43–77)
Neutro Abs: 8.5 10*3/uL — ABNORMAL HIGH (ref 1.7–7.7)
Platelets: 216 10*3/uL (ref 150–400)
RBC: 3.82 MIL/uL — AB (ref 3.87–5.11)
RDW: 13 % (ref 11.5–15.5)
WBC: 11.1 10*3/uL — ABNORMAL HIGH (ref 4.0–10.5)

## 2013-12-05 MED ORDER — DIPHENHYDRAMINE HCL 25 MG PO CAPS: 50.0000 mg | ORAL_CAPSULE | Freq: Every evening | ORAL | Status: DC | PRN

## 2013-12-05 MED ORDER — DIPHENHYDRAMINE HCL 50 MG/ML IJ SOLN
INTRAMUSCULAR | Status: AC
  Filled 2013-12-05: qty 1

## 2013-12-05 MED ORDER — LIDOCAINE HCL (CARDIAC) 20 MG/ML IV SOLN
INTRAVENOUS | Status: AC
  Filled 2013-12-05: qty 5

## 2013-12-05 MED ORDER — FENTANYL CITRATE 0.05 MG/ML IJ SOLN
INTRAMUSCULAR | Status: AC
  Filled 2013-12-05: qty 5

## 2013-12-05 MED ORDER — ROCURONIUM BROMIDE 50 MG/5ML IV SOLN
INTRAVENOUS | Status: AC
Start: 2013-12-05 — End: 2013-12-05
  Filled 2013-12-05: qty 1

## 2013-12-05 MED ORDER — PROPOFOL 10 MG/ML IV BOLUS
INTRAVENOUS | Status: AC
Start: 2013-12-05 — End: 2013-12-05
  Filled 2013-12-05: qty 20

## 2013-12-05 MED ORDER — POLYETHYLENE GLYCOL 3350 17 G PO PACK
17.0000 g | PACK | Freq: Every day | ORAL | Status: DC
  Administered 2013-12-07 – 2013-12-09 (×3): 17 g via ORAL
  Filled 2013-12-05 (×5): qty 1

## 2013-12-05 MED ORDER — MIDAZOLAM HCL 2 MG/2ML IJ SOLN
INTRAMUSCULAR | Status: AC
  Filled 2013-12-05: qty 2

## 2013-12-05 NOTE — Progress Notes (Signed)
Pharmacy states patient age exceeds range for benadryl dosing. Will make family aware

## 2013-12-05 NOTE — Progress Notes (Signed)
[facility] PROGRESS NOTE  Assessment/Plan: Hip fracture/ Closed fracture of right ulna/ Leucocytosis - On-call orthopedic surgeon Dr. Marlou Sa was consulted by the ED physician and Dr. Marlou Sa felt that this would require nonoperative management. - ct hip and x-ray of right elbow as below. - cont tylenol or narcotics for pain, add miralax.    Code Status: Full code.  Family Communication: Patient's daughter at the bedside.  Disposition Plan: Admit to inpatient.     Consultants:  Orthopedic surgeron  Procedures:  X-ray of hip  Ct head  Ct hip  Right elbolw  Antibiotics: none HPI/Subjective: Still in pain  Objective: Filed Vitals:   12/05/13 0000 12/05/13 0400 12/05/13 0643 12/05/13 0800  BP:   160/85   Pulse:   100   Temp:   98.1 F (36.7 C)   TempSrc:   Oral   Resp: 20 20 20 20   Height:      Weight:      SpO2: 97% 97% 93% 93%    Intake/Output Summary (Last 24 hours) at 12/05/13 1038 Last data filed at 12/05/13 0649  Gross per 24 hour  Intake    120 ml  Output      0 ml  Net    120 ml   Filed Weights   12/04/13 1021 12/04/13 2244  Weight: 40.37 kg (89 lb) 40.37 kg (89 lb)    Exam:  General: Alert, awake, oriented x3, in no acute distress.  HEENT: No bruits, no goiter.  Heart: Regular rate and rhythm,  Lungs: Good air movement, clear  Abdomen: Soft, nontender, nondistended, positive bowel sounds.     Data Reviewed: Basic Metabolic Panel:  Recent Labs Lab 12/04/13 1416 12/05/13 0435  NA 136* 134*  K 3.8 4.1  CL 94* 98  CO2 27 23  GLUCOSE 107* 94  BUN 9 14  CREATININE 0.57 0.60  CALCIUM 9.4 8.9   Liver Function Tests:  Recent Labs Lab 12/05/13 0435  AST 21  ALT 17  ALKPHOS 103  BILITOT 0.7  PROT 6.5  ALBUMIN 3.4*   No results found for this basename: LIPASE, AMYLASE,  in the last 168 hours No results found for this basename: AMMONIA,  in the last 168 hours CBC:  Recent Labs Lab 12/04/13 1416 12/05/13 0435  WBC  18.1* 11.1*  NEUTROABS 15.2* 8.5*  HGB 12.8 11.6*  HCT 37.9 34.4*  MCV 90.5 90.1  PLT 225 216   Cardiac Enzymes: No results found for this basename: CKTOTAL, CKMB, CKMBINDEX, TROPONINI,  in the last 168 hours BNP (last 3 results) No results found for this basename: PROBNP,  in the last 8760 hours CBG: No results found for this basename: GLUCAP,  in the last 168 hours  No results found for this or any previous visit (from the past 240 hour(s)).   Studies: Dg Lumbar Spine Complete  12/04/2013   CLINICAL DATA:  Pain post trauma  EXAM: LUMBAR SPINE - COMPLETE 4+ VIEW  COMPARISON:  None.  FINDINGS: Frontal, lateral, spot lumbosacral lateral, and bilateral oblique views were obtained. There are 5 non-rib-bearing lumbar type vertebral bodies. Bones are osteoporotic. There is marked anterior wedging of the T12 vertebral body. There is milder anterior wedging at L1 and L3. There is also anterior wedging at T8, T9, and T10. There is no spondylolisthesis. There is moderately severe disc space narrowing at L5-S1. There is moderate narrowing at all other levels in the lumbar region. There is facet osteoarthritic change at all levels bilaterally.  There is atherosclerotic change throughout the aorta.  IMPRESSION: Multiple age uncertain lumbar and lower thoracic spine fractures. Bones diffusely osteoporotic. Extensive multilevel osteoarthritic change. Extensive atherosclerotic change.   Electronically Signed   By: Lowella Grip M.D.   On: 12/04/2013 12:09   Dg Elbow 2 Views Right  12/04/2013   CLINICAL DATA:  Pain post trauma  EXAM: RIGHT ELBOW - 2 VIEW  COMPARISON:  None.  FINDINGS: Frontal and lateral views were obtained. There is a fracture of the proximal ulna with displacement of fracture fragments in this area by 1.3 cm. There is marked surrounding soft tissue swelling with extensive joint effusion. No other fractures. No dislocation. Bones are osteoporotic.  IMPRESSION: Displaced fracture proximal  ulna with marked soft tissue swelling and effusion. No dislocation apparent.   Electronically Signed   By: Lowella Grip M.D.   On: 12/04/2013 12:04   Dg Hip Complete Right  12/04/2013   CLINICAL DATA:  Pain post trauma  EXAM: RIGHT HIP - COMPLETE 2+ VIEW  COMPARISON:  None.  FINDINGS: Frontal pelvis as well as frontal and lateral right hip images were obtained. There are screws transfixing an old fracture in the subcapital femoral region on the right with tips of the screws in the proximal femoral head. No acute fracture dislocation. There is mild symmetric narrowing of both hip joints. Bones are osteoporotic.  IMPRESSION: Prior screw fixation on the right. No acute fracture or dislocation. Mild symmetric narrowing of both hip joints. Bones osteoporotic.   Electronically Signed   By: Lowella Grip M.D.   On: 12/04/2013 15:13   Ct Head Wo Contrast  12/04/2013   CLINICAL DATA:  Pain post trauma  EXAM: CT HEAD WITHOUT CONTRAST  TECHNIQUE: Contiguous axial images were obtained from the base of the skull through the vertex without intravenous contrast.  COMPARISON:  Sep 14, 2008  FINDINGS: There is mild generalized atrophy. There is no mass, hemorrhage, extra-axial fluid collection, or midline shift. There is small vessel disease throughout the centra semiovale bilaterally. There is evidence of a prior small lacunar infarct in the head of the caudate nucleus on the left, stable. There is no acute appearing infarct. No new gray-white compartment lesion is identified.  Bony calvarium appears intact. The mastoid air cells are clear. There is a right parietal scalp hematoma.  IMPRESSION: Atrophy with small vessel disease, stable. No intracranial mass, hemorrhage, or extra-axial fluid. There is a right parietal scalp hematoma.   Electronically Signed   By: Lowella Grip M.D.   On: 12/04/2013 12:11   Ct Hip Right Wo Contrast  12/04/2013   CLINICAL DATA:  80 year old female right hip pain following fall.  History of previous right hip fracture.  EXAM: CT OF THE RIGHT HIP WITHOUT CONTRAST  TECHNIQUE: Multidetector CT imaging was performed according to the standard protocol. Multiplanar CT image reconstructions were also generated.  COMPARISON:  None.  FINDINGS: A nondisplaced horizontal fracture of the greater trochanteric is noted.  3 surgical screws within the proximal right femur traversed A remote femoral neck fracture.  There is no evidence of subluxation, dislocation or complicating hardware features.  Diffuse osteopenia and degenerative changes within the right hip noted.  Degenerative changes in the lower lumbar spine are also identified.  IMPRESSION: Nondisplaced horizontal fracture of the greater trochanter.   Electronically Signed   By: Hassan Rowan M.D.   On: 12/04/2013 19:13    Scheduled Meds: . diphenhydrAMINE      . enoxaparin (LOVENOX) injection  30 mg Subcutaneous Daily   Continuous Infusions:    Charlynne Cousins  [facility] Pager [phone]. If 8PM-8AM, please contact night-coverage at [email or web address], password [facility] 12/05/2013, 10:38 AM  LOS: 1 day      **Disclaimer: This note may have been dictated with voice recognition software. Similar sounding words can inadvertently be transcribed and this note may contain transcription errors which may not have been corrected upon publication of note.**

## 2013-12-05 NOTE — Evaluation (Signed)
Physical Therapy Evaluation Patient Details Name: Vanessa Pace MRN: [id number] DOB: [birth year] Today's Date: 12/05/2013   History of Present Illness  80 y.o. female s/p mechanical fall resulting in Displaced right olecranon fracture and Nondisplaced small right greater trochanteric femur fracture  Clinical Impression  Patient admitted with displaced right olecranon fracture and nondisplaced fracture of right greater trochanter, resulting in functional limitations due to the deficits listed below (see PT Problem List). Pt unable to safely ambulate and transfer without assistance for balance. Even if pt is cleared for use of a platform walker post op, I feel short term SNF placement will be the safest route for her to improve her level of independence prior to returning home. Patient will benefit from skilled PT to increase their independence and safety with mobility to allow discharge to the venue listed below.      Follow Up Recommendations SNF;Supervision for mobility/OOB    Equipment Recommendations  3in1 (PT) (Ambulatory device to be determined after surgery.)    Recommendations for Other Services OT consult     Precautions / Restrictions Precautions Precautions: Fall Required Braces or Orthoses: Sling Restrictions Weight Bearing Restrictions: Yes RUE Weight Bearing: Non weight bearing RLE Weight Bearing: Weight bearing as tolerated      Mobility  Bed Mobility Overal bed mobility: Needs Assistance Bed Mobility: Supine to Sit     Supine to sit: Min assist;+2 for safety/equipment     General bed mobility comments: Min assist for trunk control from sidelying position to seated position. VC for technique.  Transfers Overall transfer level: Needs assistance Equipment used: Quad cane Transfers: Sit to/from Stand Sit to Stand: Min assist         General transfer comment: Min assist for stability as pt has loss of balance upon standing towards right side. Performed  from lowest bed setting and recliner x2. VC for hand placement.  Ambulation/Gait Ambulation/Gait assistance: Min assist;+2 safety/equipment Ambulation Distance (Feet): 10 Feet (x2) Assistive device: Quad cane Gait Pattern/deviations: Step-to pattern;Decreased step length - left;Decreased stance time - right;Antalgic (3 pt gait with quad cane)   Gait velocity interpretation: Below normal speed for age/gender General Gait Details: Educated on safe use of DME with quad cane. Pt with some loss of balance requiring min assist to correct. No buckling but required quite a bit of effort to bear weight through RLE.  Stairs            Wheelchair Mobility    Modified Rankin (Stroke Patients Only)       Balance Overall balance assessment: Needs assistance;History of Falls Sitting-balance support: No upper extremity supported;Feet supported Sitting balance-Leahy Scale: Good     Standing balance support: Single extremity supported Standing balance-Leahy Scale: Poor                               Pertinent Vitals/Pain Pt reports pain increases with weight bearing on RLE. Nurse aware Patient repositioned in chair for comfort.     Home Living Family/patient expects to be discharged to:: Skilled nursing facility Living Arrangements: Children Available Help at Discharge: Family;Other (Comment) (Cannot confirm 24 hour care at home.) Type of Home: House Home Access: Stairs to enter Entrance Stairs-Rails: None Entrance Stairs-Number of Steps: 3 Home Layout: One level Home Equipment: None      Prior Function Level of Independence: Independent               Hand Dominance  Extremity/Trunk Assessment   Upper Extremity Assessment: Defer to OT evaluation           Lower Extremity Assessment: RLE deficits/detail RLE Deficits / Details: Decreased strength and ROM - limited assessment due to pain       Communication   Communication: HOH  Cognition  Arousal/Alertness: Awake/alert Behavior During Therapy: WFL for tasks assessed/performed Overall Cognitive Status: Within Functional Limits for tasks assessed                      General Comments      Exercises        Assessment/Plan    PT Assessment Patient needs continued PT services  PT Diagnosis Difficulty walking;Abnormality of gait;Acute pain   PT Problem List Decreased strength;Decreased range of motion;Decreased activity tolerance;Decreased balance;Decreased mobility;Decreased knowledge of use of DME;Decreased knowledge of precautions;Pain  PT Treatment Interventions DME instruction;Gait training;Functional mobility training;Therapeutic activities;Therapeutic exercise;Balance training;Neuromuscular re-education;Patient/family education;Modalities   PT Goals (Current goals can be found in the Care Plan section) Acute Rehab PT Goals Patient Stated Goal: Get better PT Goal Formulation: With patient Time For Goal Achievement: 12/12/13 Potential to Achieve Goals: Good    Frequency Min 3X/week   Barriers to discharge Decreased caregiver support Cannot confirm 24 hour care at home    Co-evaluation               End of Session Equipment Utilized During Treatment: Other (comment) (RUE sling) Activity Tolerance: Patient tolerated treatment well Patient left: in chair;with call bell/phone within reach;with nursing/sitter in room Nurse Communication: Mobility status         Time: 1127-1200 PT Time Calculation (min): 33 min   Charges:   PT Evaluation $Initial PT Evaluation Tier I: 1 Procedure PT Treatments $Gait Training: 8-22 mins $Therapeutic Activity: 8-22 mins   PT G Codes:        [employer], [place]   Ellouise Newer 12/05/2013, 2:12 PM

## 2013-12-05 NOTE — Progress Notes (Signed)
Patient's elbow surgery cancelled today due to lovenox administration at 11:30. Will plan to proceed tomorrow late afternoon/ early evening. Will likely be able to allow weight-bearing thru R UE with platform after ORIF.

## 2013-12-05 NOTE — Progress Notes (Signed)
Pt cancelled by Dr Grandville Silos. Lovenox 30mg  was given at 11:30am. Lillia Abed MD

## 2013-12-05 NOTE — Progress Notes (Signed)
Clinical Social Work Department BRIEF PSYCHOSOCIAL ASSESSMENT 12/05/2013  Patient:  Vanessa Pace,Vanessa Pace     Account Number:  [id number]     Admit date:  12/04/2013  Clinical Social Worker:  Rolinda Roan  Date/Time:  12/05/2013 07:03 PM  Referred by:  Physician  Date Referred:  12/04/2013 Referred for  SNF Placement   Other Referral:   Interview type:  Family Other interview type:    PSYCHOSOCIAL DATA Living Status:  WITH ADULT CHILDREN Admitted from facility:   Level of care:   Primary support name:  Vanessa Pace Primary support relationship to patient:  CHILD, ADULT Degree of support available:   Good support at bedside.    CURRENT CONCERNS  Other Concerns:    SOCIAL WORK ASSESSMENT / PLAN Clinical Social Worker (CSW) met with patient and her daughter Vanessa Pace at bedside. Patient and daughter are agreeable to SNF search in [place]. Patient reported that she [place] [address] in [place] with her daughter and son. Daughter reported that she did not want patient at [facility]. CSW gave daughter SNF list and encouraged her to go on medicare.gov to view facility ratings.   Assessment/plan status:  Psychosocial Support/Ongoing Assessment of Needs Other assessment/ plan:   Information/referral to community resources:   CSW gave daughter SNF list.    PATIENT'S/FAMILY'S RESPONSE TO PLAN OF CARE: Patient and daughter thanked CSW for visit and assisting with placement process.

## 2013-12-05 NOTE — Progress Notes (Signed)
Clinical Social Work Department CLINICAL SOCIAL WORK PLACEMENT NOTE 12/05/2013  Patient:  Vanessa Pace,Vanessa Pace  Account Number:  [id number] [place] date:  12/04/2013  Clinical Social Worker:  Blima Rich, [place]  Date/time:  12/05/2013 07:08 PM  Clinical Social Work is seeking post-discharge placement for this patient at the following level of care:   [place]   (*CSW will update this form in Epic as items are completed)   12/05/2013  Patient/family provided with [place] Department of Clinical Social Work's list of facilities offering this level of care within the geographic area requested by the patient (or if unable, by the patient's family).  12/05/2013  Patient/family informed of their freedom to choose among providers that offer the needed level of care, that participate in Medicare, Medicaid or managed care program needed by the patient, have an available bed and are willing to accept the patient.  12/05/2013  Patient/family informed of [facility]' ownership interest in [facility], as well as of the fact that they are under no obligation to receive care at this facility.  PASARR submitted to EDS on 12/05/2013 PASARR number received on 12/05/2013  FL2 transmitted to all facilities in geographic area requested by pt/family on  12/05/2013 FL2 transmitted to all facilities within larger geographic area on   Patient informed that his/her managed care company has contracts with or will negotiate with  certain facilities, including the following:     Patient/family informed of bed offers received:   Patient chooses bed at  Physician recommends and patient chooses bed at    Patient to be transferred to  on   Patient to be transferred to facility by  Patient and family notified of transfer on  Name of family member notified:    The following physician request were entered in Epic:   Additional Comments:

## 2013-12-05 NOTE — Anesthesia Preprocedure Evaluation (Deleted)
Anesthesia Evaluation  Patient identified by MRN, date of birth, ID band Patient awake    Reviewed: Allergy & Precautions, H&P , NPO status , Patient's Chart, lab work & pertinent test results  Airway Mallampati: I TM Distance: >3 FB Neck ROM: Full    Dental  (+) Teeth Intact, Partial Upper   Pulmonary          Cardiovascular     Neuro/Psych    GI/Hepatic   Endo/Other    Renal/GU      Musculoskeletal   Abdominal   Peds  Hematology   Anesthesia Other Findings Bridge upper rt. Pt. Says it does not come out.  Explained to her risk of breakage  Reproductive/Obstetrics                          Anesthesia Physical Anesthesia Plan Anesthesia Quick Evaluation

## 2013-12-05 NOTE — Consult Note (Signed)
ORTHOPAEDIC CONSULTATION HISTORY & PHYSICAL REQUESTING PHYSICIAN: Charlynne Cousins, MD  Chief Complaint: right elbow fracture  HPI: Vanessa Pace is a 80 y.o. female who fell on her port yesterday at home, trying to feed her cat that started between her legs. She lives with family, but is home alone during the day. I was originally contacted yesterday by Dr. Christy Gentles in the emergency department. He indicated that he had called Dr. Tonita Cong, who was on call for orthopedic surgery for the right elbow fracture. Dr. Tonita Cong deferred, and apparently asked Dr. Christy Gentles to call hand surgery.  He and I discussed this patient's care over the phone, and agreed on application of a posterior splint and sling, with followup in the office this week, and delayed skeletal reconstruction on Friday.    Apparently, when attempting to mobilize the patient for discharge from the emergency room, she was complaining of right hip pain which led to additional investigations and ultimately uncovered a nondisplaced small greater trochanteric femur fracture.  She was ultimately admitted to the hospitalist service for nonoperative care of the hip fracture, including therapy and possible placement if her mobility remained sufficiently impaired.  I was unaware that this patient had a hip fracture and was being admitted until contacted by nursing this morning regarding the patient's n.p.o. status.  Past Medical History  Diagnosis Date  . Arthritis    Past Surgical History  Procedure Laterality Date  . Fracture surgery      right hip  . Femur surgery     History   Social History  . Marital Status: Single    Spouse Name: N/A    Number of Children: N/A  . Years of Education: N/A   Social History Main Topics  . Smoking status: Never Smoker   . Smokeless tobacco: Never Used  . Alcohol Use: Yes     Comment: occasionally  . Drug Use: None  . Sexual Activity: None   Other Topics Concern  . None   Social History  Narrative  . None   Family History  Problem Relation Age of Onset  . CAD Father   . CAD Son   . Cervical cancer Daughter    Allergies  Allergen Reactions  . Adhesive [Tape] Other (See Comments)    Tears skin  . Amoxicillin Rash  . Hydrocodone Nausea Only   Prior to Admission medications   Medication Sig Start Date End Date Taking? Authorizing Provider  aspirin EC 325 MG tablet Take 162.5 mg by mouth daily.   Yes Historical Provider, MD  DiphenhydrAMINE HCl, Sleep, (UNISOM SLEEPGELS) 50 MG CAPS Take 50 mg by mouth at bedtime.   Yes Historical Provider, MD  oxyCODONE-acetaminophen (PERCOCET/ROXICET) 5-325 MG per tablet Take 1 tablet by mouth every 8 (eight) hours as needed for severe pain. 12/04/13   Sharyon Cable, MD   Dg Lumbar Spine Complete  12/04/2013   CLINICAL DATA:  Pain post trauma  EXAM: LUMBAR SPINE - COMPLETE 4+ VIEW  COMPARISON:  None.  FINDINGS: Frontal, lateral, spot lumbosacral lateral, and bilateral oblique views were obtained. There are 5 non-rib-bearing lumbar type vertebral bodies. Bones are osteoporotic. There is marked anterior wedging of the T12 vertebral body. There is milder anterior wedging at L1 and L3. There is also anterior wedging at T8, T9, and T10. There is no spondylolisthesis. There is moderately severe disc space narrowing at L5-S1. There is moderate narrowing at all other levels in the lumbar region. There is facet osteoarthritic change at  all levels bilaterally. There is atherosclerotic change throughout the aorta.  IMPRESSION: Multiple age uncertain lumbar and lower thoracic spine fractures. Bones diffusely osteoporotic. Extensive multilevel osteoarthritic change. Extensive atherosclerotic change.   Electronically Signed   By: Lowella Grip M.D.   On: 12/04/2013 12:09   Dg Elbow 2 Views Right  12/04/2013   CLINICAL DATA:  Pain post trauma  EXAM: RIGHT ELBOW - 2 VIEW  COMPARISON:  None.  FINDINGS: Frontal and lateral views were obtained. There is a  fracture of the proximal ulna with displacement of fracture fragments in this area by 1.3 cm. There is marked surrounding soft tissue swelling with extensive joint effusion. No other fractures. No dislocation. Bones are osteoporotic.  IMPRESSION: Displaced fracture proximal ulna with marked soft tissue swelling and effusion. No dislocation apparent.   Electronically Signed   By: Lowella Grip M.D.   On: 12/04/2013 12:04   Dg Hip Complete Right  12/04/2013   CLINICAL DATA:  Pain post trauma  EXAM: RIGHT HIP - COMPLETE 2+ VIEW  COMPARISON:  None.  FINDINGS: Frontal pelvis as well as frontal and lateral right hip images were obtained. There are screws transfixing an old fracture in the subcapital femoral region on the right with tips of the screws in the proximal femoral head. No acute fracture dislocation. There is mild symmetric narrowing of both hip joints. Bones are osteoporotic.  IMPRESSION: Prior screw fixation on the right. No acute fracture or dislocation. Mild symmetric narrowing of both hip joints. Bones osteoporotic.   Electronically Signed   By: Lowella Grip M.D.   On: 12/04/2013 15:13   Ct Head Wo Contrast  12/04/2013   CLINICAL DATA:  Pain post trauma  EXAM: CT HEAD WITHOUT CONTRAST  TECHNIQUE: Contiguous axial images were obtained from the base of the skull through the vertex without intravenous contrast.  COMPARISON:  Sep 14, 2008  FINDINGS: There is mild generalized atrophy. There is no mass, hemorrhage, extra-axial fluid collection, or midline shift. There is small vessel disease throughout the centra semiovale bilaterally. There is evidence of a prior small lacunar infarct in the head of the caudate nucleus on the left, stable. There is no acute appearing infarct. No new gray-white compartment lesion is identified.  Bony calvarium appears intact. The mastoid air cells are clear. There is a right parietal scalp hematoma.  IMPRESSION: Atrophy with small vessel disease, stable. No  intracranial mass, hemorrhage, or extra-axial fluid. There is a right parietal scalp hematoma.   Electronically Signed   By: Lowella Grip M.D.   On: 12/04/2013 12:11   Ct Hip Right Wo Contrast  12/04/2013   CLINICAL DATA:  80 year old female right hip pain following fall. History of previous right hip fracture.  EXAM: CT OF THE RIGHT HIP WITHOUT CONTRAST  TECHNIQUE: Multidetector CT imaging was performed according to the standard protocol. Multiplanar CT image reconstructions were also generated.  COMPARISON:  None.  FINDINGS: A nondisplaced horizontal fracture of the greater trochanteric is noted.  3 surgical screws within the proximal right femur traversed A remote femoral neck fracture.  There is no evidence of subluxation, dislocation or complicating hardware features.  Diffuse osteopenia and degenerative changes within the right hip noted.  Degenerative changes in the lower lumbar spine are also identified.  IMPRESSION: Nondisplaced horizontal fracture of the greater trochanter.   Electronically Signed   By: Hassan Rowan M.D.   On: 12/04/2013 19:13    Positive ROS: All other systems have been reviewed and were otherwise  negative with the exception of those mentioned in the HPI and as above.  Physical Exam: Vitals: Refer to EMR. Constitutional:  WD, WN, NAD HEENT:  Small posterior laceration, EOMI Neuro/Psych:  Alert & oriented to person, place, and time; appropriate mood & affect Lymphatic: No generalized extremity edema or lymphadenopathy Extremities / MSK:  The extremities are normal with respect to appearance, ranges of motion, joint stability, muscle strength/tone, sensation, & perfusion except as otherwise noted:  There is tenderness to palpation with firm palpation over the greater trochanter on the right. She reports mild pain with passive internal and external rotation of the hip, and can actively make 10 a straight leg raise to some degree.  Right upper extremity has a posterior  splint in place, mild swelling in the fingers, intact light touch sensibility in the radial, median, ulnar nerve distributions intact motor to the same.  Assessment: 1. Displaced right olecranon fracture 2. Nondisplaced small right greater trochanteric femur fracture  Plan: I spoke with Dr. Nona Dell PA on the phone today, who agreed with weightbearing as tolerated for the right lower extremity.  Although I had intended for the patient to remain n.p.o. until the plan could be completely sorted out, she had eaten half of her breakfast at 10:00am.  For these reasons, I asked her to finish her breakfast, then we would make her n.p.o. again. Therapy will provide initial evaluation today and together we will forecast whether she will be sufficiently mobile to be safe at home within a day or 2, or will need SNF placement.  If it turns out that her mobility would be sufficiently enhanced by surgical reconstruction of her right elbow done more promptly, we will potentially proceed with ORIF tonight.  If, however, she mobilizes sufficiently to be discharged home, then we will proceed with delayed reconstruction on Friday as previously planned.  Rayvon Char [address], [address] [place], [place]  [address] Office: [phone] Mobile: [phone]

## 2013-12-06 ENCOUNTER — Encounter (HOSPITAL_COMMUNITY): Payer: Self-pay | Admitting: General Practice

## 2013-12-06 ENCOUNTER — Encounter (HOSPITAL_COMMUNITY)
Admission: EM | Disposition: A | Discharge status: Rehabilitation Facility | Payer: Self-pay | Source: Home / Self Care | Attending: Internal Medicine

## 2013-12-06 ENCOUNTER — Encounter (HOSPITAL_COMMUNITY): Payer: Medicare Other | Admitting: Anesthesiology

## 2013-12-06 ENCOUNTER — Inpatient Hospital Stay (HOSPITAL_COMMUNITY): Payer: Medicare Other

## 2013-12-06 ENCOUNTER — Inpatient Hospital Stay (HOSPITAL_COMMUNITY): Payer: Medicare Other | Admitting: Anesthesiology

## 2013-12-06 DIAGNOSIS — T148XXA Other injury of unspecified body region, initial encounter: Secondary | ICD-10-CM

## 2013-12-06 HISTORY — PX: ORIF ELBOW FRACTURE: SHX5031

## 2013-12-06 LAB — SURGICAL PCR SCREEN
MRSA, PCR: NEGATIVE
STAPHYLOCOCCUS AUREUS: NEGATIVE

## 2013-12-06 LAB — VITAMIN D 25 HYDROXY (VIT D DEFICIENCY, FRACTURES): Vit D, 25-Hydroxy: 16 ng/mL — ABNORMAL LOW (ref 30–89)

## 2013-12-06 SURGERY — OPEN REDUCTION INTERNAL FIXATION (ORIF) ELBOW/OLECRANON FRACTURE
Anesthesia: General | Laterality: Right

## 2013-12-06 SURGERY — FIXATION, FRACTURE, INTERTROCHANTERIC, WITH INTRAMEDULLARY ROD
Anesthesia: General | Laterality: Left

## 2013-12-06 MED ORDER — CLINDAMYCIN PHOSPHATE 600 MG/50ML IV SOLN
600.0000 mg | INTRAVENOUS | Status: AC
  Filled 2013-12-06: qty 50

## 2013-12-06 MED ORDER — FENTANYL CITRATE 0.05 MG/ML IJ SOLN
INTRAMUSCULAR | Status: DC | PRN
  Administered 2013-12-06: 50 ug via INTRAVENOUS

## 2013-12-06 MED ORDER — PHENYLEPHRINE HCL 10 MG/ML IJ SOLN
10.0000 mg | INTRAMUSCULAR | Status: DC | PRN
  Administered 2013-12-06: 25 ug/min via INTRAVENOUS

## 2013-12-06 MED ORDER — CALCIUM CARBONATE 1250 (500 CA) MG PO TABS
1250.0000 mg | ORAL_TABLET | Freq: Two times a day (BID) | ORAL | Status: DC
  Administered 2013-12-07 – 2013-12-09 (×5): 1250 mg via ORAL
  Filled 2013-12-06 (×9): qty 1

## 2013-12-06 MED ORDER — ONDANSETRON HCL 4 MG/2ML IJ SOLN: 4.0000 mg | Freq: Once | INTRAMUSCULAR | Status: DC | PRN

## 2013-12-06 MED ORDER — PROPOFOL 10 MG/ML IV BOLUS
INTRAVENOUS | Status: DC | PRN
  Administered 2013-12-06: 140 mg via INTRAVENOUS

## 2013-12-06 MED ORDER — EPHEDRINE SULFATE 50 MG/ML IJ SOLN
INTRAMUSCULAR | Status: AC
  Filled 2013-12-06: qty 1

## 2013-12-06 MED ORDER — FENTANYL CITRATE 0.05 MG/ML IJ SOLN
INTRAMUSCULAR | Status: AC
  Filled 2013-12-06: qty 5

## 2013-12-06 MED ORDER — GLYCOPYRROLATE 0.2 MG/ML IJ SOLN
INTRAMUSCULAR | Status: AC
  Filled 2013-12-06: qty 2

## 2013-12-06 MED ORDER — ROCURONIUM BROMIDE 100 MG/10ML IV SOLN
INTRAVENOUS | Status: DC | PRN
  Administered 2013-12-06: 30 mg via INTRAVENOUS

## 2013-12-06 MED ORDER — SODIUM CHLORIDE 0.9 % IJ SOLN
INTRAMUSCULAR | Status: AC
  Filled 2013-12-06: qty 10

## 2013-12-06 MED ORDER — NEOSTIGMINE METHYLSULFATE 10 MG/10ML IV SOLN
INTRAVENOUS | Status: DC | PRN
  Administered 2013-12-06: 2 mg via INTRAVENOUS

## 2013-12-06 MED ORDER — PHENYLEPHRINE HCL 10 MG/ML IJ SOLN
INTRAMUSCULAR | Status: DC | PRN
  Administered 2013-12-06: 40 ug via INTRAVENOUS
  Administered 2013-12-06: 80 ug via INTRAVENOUS
  Administered 2013-12-06: 120 ug via INTRAVENOUS
  Administered 2013-12-06: 80 ug via INTRAVENOUS

## 2013-12-06 MED ORDER — ONDANSETRON HCL 4 MG/2ML IJ SOLN
INTRAMUSCULAR | Status: DC | PRN
  Administered 2013-12-06: 4 mg via INTRAVENOUS

## 2013-12-06 MED ORDER — GLYCOPYRROLATE 0.2 MG/ML IJ SOLN
INTRAMUSCULAR | Status: DC | PRN
  Administered 2013-12-06: 0.4 mg via INTRAVENOUS

## 2013-12-06 MED ORDER — ETOMIDATE 2 MG/ML IV SOLN
INTRAVENOUS | Status: AC
  Filled 2013-12-06: qty 10

## 2013-12-06 MED ORDER — LACTATED RINGERS IV SOLN
INTRAVENOUS | Status: DC | PRN
  Administered 2013-12-06 (×2): via INTRAVENOUS

## 2013-12-06 MED ORDER — NEOSTIGMINE METHYLSULFATE 10 MG/10ML IV SOLN
INTRAVENOUS | Status: AC
  Filled 2013-12-06: qty 1

## 2013-12-06 MED ORDER — HYDROMORPHONE HCL PF 1 MG/ML IJ SOLN: 0.2500 mg | INTRAMUSCULAR | Status: DC | PRN

## 2013-12-06 MED ORDER — ROCURONIUM BROMIDE 50 MG/5ML IV SOLN
INTRAVENOUS | Status: AC
  Filled 2013-12-06: qty 1

## 2013-12-06 MED ORDER — 0.9 % SODIUM CHLORIDE (POUR BTL) OPTIME
TOPICAL | Status: DC | PRN
  Administered 2013-12-06: 300 mL

## 2013-12-06 MED ORDER — CLINDAMYCIN PHOSPHATE 600 MG/50ML IV SOLN
INTRAVENOUS | Status: DC | PRN
  Administered 2013-12-06: 600 mg via INTRAVENOUS

## 2013-12-06 MED ORDER — LIDOCAINE HCL (CARDIAC) 20 MG/ML IV SOLN
INTRAVENOUS | Status: DC | PRN
  Administered 2013-12-06: 80 mg via INTRAVENOUS

## 2013-12-06 MED ORDER — PROPOFOL 10 MG/ML IV BOLUS
INTRAVENOUS | Status: AC
  Filled 2013-12-06: qty 20

## 2013-12-06 SURGICAL SUPPLY — 85 items
BIT DRILL 2.0 LNG QUCK RELEASE (BIT) ×1 IMPLANT
BIT DRILL 2.8 QUICK RELEASE (BIT) ×1 IMPLANT
BLADE MINI RND TIP GREEN BEAV (BLADE) IMPLANT
BLADE SURG 15 STRL LF DISP TIS (BLADE) ×1 IMPLANT
BLADE SURG 15 STRL SS (BLADE) ×3
BNDG CMPR 9X4 STRL LF SNTH (GAUZE/BANDAGES/DRESSINGS) ×1
BNDG COHESIVE 4X5 TAN STRL (GAUZE/BANDAGES/DRESSINGS) ×3 IMPLANT
BNDG COHESIVE 6X5 TAN STRL LF (GAUZE/BANDAGES/DRESSINGS) IMPLANT
BNDG ESMARK 4X9 LF (GAUZE/BANDAGES/DRESSINGS) ×3 IMPLANT
BNDG GAUZE ELAST 4 BULKY (GAUZE/BANDAGES/DRESSINGS) ×3 IMPLANT
BONE CHIP PRESERV 20CC (Bone Implant) ×3 IMPLANT
BRUSH SCRUB EZ PLAIN DRY (MISCELLANEOUS) ×3 IMPLANT
CANISTER SUCT 1200ML W/VALVE (MISCELLANEOUS) ×3 IMPLANT
CHLORAPREP W/TINT 26ML (MISCELLANEOUS) ×3 IMPLANT
CORDS BIPOLAR (ELECTRODE) IMPLANT
COVER MAYO STAND STRL (DRAPES) IMPLANT
COVER TABLE BACK 60X90 (DRAPES) IMPLANT
CUFF TOURNIQUET SINGLE 18IN (TOURNIQUET CUFF) ×3 IMPLANT
CUFF TOURNIQUET SINGLE 24IN (TOURNIQUET CUFF) IMPLANT
DRAIN PENROSE 1/4X12 LTX STRL (WOUND CARE) IMPLANT
DRAPE C-ARM 42X72 X-RAY (DRAPES) ×3 IMPLANT
DRAPE EXTREMITY T 121X128X90 (DRAPE) IMPLANT
DRAPE U-SHAPE 47X51 STRL (DRAPES) ×3 IMPLANT
DRAPE U-SHAPE 76X120 STRL (DRAPES) IMPLANT
DRILL 2.0 LNG QUICK RELEASE (BIT) ×3
DRILL 2.8 QUICK RELEASE (BIT) ×3
DRSG ADAPTIC 3X8 NADH LF (GAUZE/BANDAGES/DRESSINGS) ×3 IMPLANT
DRSG EMULSION OIL 3X3 NADH (GAUZE/BANDAGES/DRESSINGS) IMPLANT
DURAPREP 26ML APPLICATOR (WOUND CARE) IMPLANT
ELECT REM PT RETURN 9FT ADLT (ELECTROSURGICAL) ×3
ELECTRODE REM PT RTRN 9FT ADLT (ELECTROSURGICAL) ×1 IMPLANT
GLOVE BIO SURGEON STRL SZ7.5 (GLOVE) ×3 IMPLANT
GLOVE BIOGEL PI IND STRL 8 (GLOVE) ×1 IMPLANT
GLOVE BIOGEL PI INDICATOR 8 (GLOVE) ×2
GOWN STRL REUS W/ TWL XL LVL3 (GOWN DISPOSABLE) ×2 IMPLANT
GOWN STRL REUS W/TWL XL LVL3 (GOWN DISPOSABLE) ×4
GUIDEWIRE ORTH 6X062XTROC NS (WIRE) ×2 IMPLANT
K-WIRE .062 (WIRE) ×6
KIT BASIN OR (CUSTOM PROCEDURE TRAY) ×3 IMPLANT
NEEDLE HYPO 25X1 1.5 SAFETY (NEEDLE) IMPLANT
NEEDLE STRAIGHT KEITH (NEEDLE) ×3 IMPLANT
NS IRRIG 1000ML POUR BTL (IV SOLUTION) ×3 IMPLANT
PAD CAST 4YDX4 CTTN HI CHSV (CAST SUPPLIES) ×1 IMPLANT
PADDING CAST ABS 4INX4YD NS (CAST SUPPLIES) ×2
PADDING CAST ABS COTTON 4X4 ST (CAST SUPPLIES) ×1 IMPLANT
PADDING CAST COTTON 4X4 STRL (CAST SUPPLIES) ×3
PENCIL BUTTON HOLSTER BLD 10FT (ELECTRODE) IMPLANT
PLATE OLECRANON RT .3HOLE (Plate) ×3 IMPLANT
RUBBERBAND STERILE (MISCELLANEOUS) IMPLANT
SCREW CORTICAL 3.5X20MM (Screw) ×3 IMPLANT
SCREW HEX LOCK 2.7X16MM (Screw) ×3 IMPLANT
SCREW HEXALOBE LOCK 3.5X22MM (Screw) ×3 IMPLANT
SCREW HEXALOBE LOCKING 3.5X14M (Screw) ×3 IMPLANT
SCREW LOCK 18X2.7X HEXALOBE (Screw) ×2 IMPLANT
SCREW LOCK 24X2.7X HEXALOBE (Screw) ×1 IMPLANT
SCREW LOCKING 2.7X10 (Screw) ×3 IMPLANT
SCREW LOCKING 2.7X18MM (Screw) ×6 IMPLANT
SCREW LOCKING 2.7X24MM (Screw) ×3 IMPLANT
SCREW NON LOCK 3.5X40 (Screw) ×3 IMPLANT
SLING ARM IMMOBILIZER LRG (SOFTGOODS) IMPLANT
SPONGE GAUZE 4X4 12PLY (GAUZE/BANDAGES/DRESSINGS) IMPLANT
SPONGE GAUZE 4X4 12PLY STER LF (GAUZE/BANDAGES/DRESSINGS) ×3 IMPLANT
SPONGE LAP 18X18 X RAY DECT (DISPOSABLE) ×3 IMPLANT
STAPLER SKIN PROX WIDE 3.9 (STAPLE) IMPLANT
STAPLER VISISTAT 35W (STAPLE) IMPLANT
STOCKINETTE 4X48 STRL (DRAPES) IMPLANT
SUCTION FRAZIER TIP 10 FR DISP (SUCTIONS) ×3 IMPLANT
SUT FIBERWIRE #2 38 T-5 BLUE (SUTURE) ×3
SUT VIC AB 0 CT1 27 (SUTURE) ×3
SUT VIC AB 0 CT1 27XBRD ANTBC (SUTURE) ×1 IMPLANT
SUT VIC AB 2-0 CT1 27 (SUTURE) ×3
SUT VIC AB 2-0 CT1 TAPERPNT 27 (SUTURE) ×1 IMPLANT
SUT VIC AB 3-0 PS2 18 (SUTURE) ×3
SUT VIC AB 3-0 PS2 18XBRD (SUTURE) ×1 IMPLANT
SUT VICRYL 4-0 PS2 18IN ABS (SUTURE) IMPLANT
SUT VICRYL RAPIDE 4/0 PS 2 (SUTURE) ×3 IMPLANT
SUTURE FIBERWR #2 38 T-5 BLUE (SUTURE) ×1 IMPLANT
SYR BULB 3OZ (MISCELLANEOUS) IMPLANT
SYRINGE 10CC LL (SYRINGE) IMPLANT
TOWEL OR 17X24 6PK STRL BLUE (TOWEL DISPOSABLE) ×6 IMPLANT
TOWEL OR NON WOVEN STRL DISP B (DISPOSABLE) ×3 IMPLANT
TUBE CONNECTING 20'X1/4 (TUBING) ×1
TUBE CONNECTING 20X1/4 (TUBING) ×2 IMPLANT
UNDERPAD 30X30 INCONTINENT (UNDERPADS AND DIAPERS) ×3 IMPLANT
YANKAUER SUCT BULB TIP NO VENT (SUCTIONS) ×3 IMPLANT

## 2013-12-06 SURGICAL SUPPLY — 24 items
BNDG COHESIVE 4X5 TAN STRL (GAUZE/BANDAGES/DRESSINGS) ×3 IMPLANT
COVER MAYO STAND STRL (DRAPES) ×3 IMPLANT
COVER PERINEAL POST (MISCELLANEOUS) ×3 IMPLANT
DRAPE STERI IOBAN 125X83 (DRAPES) ×3 IMPLANT
DRAPE U-SHAPE 47X51 STRL (DRAPES) ×3 IMPLANT
DRSG MEPILEX BORDER 4X4 (GAUZE/BANDAGES/DRESSINGS) ×3 IMPLANT
DRSG PAD ABDOMINAL 8X10 ST (GAUZE/BANDAGES/DRESSINGS) ×3 IMPLANT
DURAPREP 26ML APPLICATOR (WOUND CARE) ×3 IMPLANT
ELECT REM PT RETURN 9FT ADLT (ELECTROSURGICAL) ×3
ELECTRODE REM PT RTRN 9FT ADLT (ELECTROSURGICAL) ×1 IMPLANT
GLOVE BIO SURGEON STRL SZ7.5 (GLOVE) ×3 IMPLANT
GLOVE BIOGEL PI IND STRL 8 (GLOVE) ×1 IMPLANT
GLOVE BIOGEL PI INDICATOR 8 (GLOVE) ×2
KIT BASIN OR (CUSTOM PROCEDURE TRAY) ×3 IMPLANT
LINER BOOT UNIVERSAL DISP (MISCELLANEOUS) ×3 IMPLANT
PACK GENERAL/GYN (CUSTOM PROCEDURE TRAY) ×3 IMPLANT
PAD CAST 4YDX4 CTTN HI CHSV (CAST SUPPLIES) ×1 IMPLANT
PADDING CAST COTTON 4X4 STRL (CAST SUPPLIES) ×3
SPONGE GAUZE 4X4 12PLY (GAUZE/BANDAGES/DRESSINGS) ×3 IMPLANT
STAPLER VISISTAT 35W (STAPLE) ×3 IMPLANT
SUT VIC AB 2-0 CT1 27 (SUTURE)
SUT VIC AB 2-0 CT1 27XBRD (SUTURE) IMPLANT
SUT VIC AB 2-0 FS1 27 (SUTURE) ×3 IMPLANT
TOWEL OR 17X26 10 PK STRL BLUE (TOWEL DISPOSABLE) ×3 IMPLANT

## 2013-12-06 NOTE — Anesthesia Procedure Notes (Signed)
Anesthesia Regional Block:  Interscalene brachial plexus block  Pre-Anesthetic Checklist: ,, timeout performed, Correct Patient, Correct Site, Correct Laterality, Correct Procedure, Correct Position, site marked, Risks and benefits discussed,  Surgical consent,  Pre-op evaluation,  At surgeon's request and post-op pain management  Laterality: Right  Prep: Maximum Sterile Barrier Precautions used, chloraprep and alcohol swabs       Needles:  Injection technique: Single-shot  Needle Type: Stimulator Needle - 40        Needle insertion depth: 1 cm   Additional Needles:  Procedures: nerve stimulator Interscalene brachial plexus block  Nerve Stimulator or Paresthesia:  Response: 0.5 mA, 0.1 ms, 1 cm  Additional Responses:   Narrative:  Start time: 12/06/2013 5:50 PM End time: 12/06/2013 5:55 PM Injection made incrementally with aspirations every 5 mL.  Performed by: Personally  Anesthesiologist: Sharolyn Douglas MD  Additional Notes: Pt accepts procedure w/ risks. 10cc 0.5% Marcaine w/ epi w/o difficulty. Pt tolerated well.  GES

## 2013-12-06 NOTE — Progress Notes (Signed)
[facility] PROGRESS NOTE  Assessment/Plan: Hip fracture/ Closed fracture of right ulna/ Leucocytosis - On-call orthopedic surgeon Dr. Marlou Sa was consulted by the ED physician and Dr. Marlou Sa felt that this would require nonoperative management. - ct hip and x-ray of right elbow as below. - cont tylenol or narcotics for pain, add miralax.   Osteoporosis: - start vit d and calcium. - she refused bisphosphonate.  Code Status: Full code.  Family Communication: Patient's daughter at the bedside.  Disposition Plan: Admit to inpatient.     Consultants:  Orthopedic surgeron  Procedures:  X-ray of hip  Ct head  Ct hip  Right elbolw  Antibiotics: none HPI/Subjective: Still in pain  Objective: Filed Vitals:   12/05/13 2049 12/06/13 0000 12/06/13 0400 12/06/13 0628  BP: 141/72   156/91  Pulse: 104   93  Temp: 98.2 F (36.8 C)   97.9 F (36.6 C)  TempSrc: Oral   Oral  Resp:  16 16   Height:      Weight:      SpO2: 91% 91% 91% 95%    Intake/Output Summary (Last 24 hours) at 12/06/13 1109 Last data filed at 12/05/13 1700  Gross per 24 hour  Intake    240 ml  Output      0 ml  Net    240 ml   Filed Weights   12/04/13 1021 12/04/13 2244  Weight: 40.37 kg (89 lb) 40.37 kg (89 lb)    Exam:  General: Alert, awake, oriented x3, in no acute distress.  HEENT: No bruits, no goiter.  Heart: Regular rate and rhythm,  Lungs: Good air movement, clear  Abdomen: Soft, nontender, nondistended, positive bowel sounds.     Data Reviewed: Basic Metabolic Panel:  Recent Labs Lab 12/04/13 1416 12/05/13 0435  NA 136* 134*  K 3.8 4.1  CL 94* 98  CO2 27 23  GLUCOSE 107* 94  BUN 9 14  CREATININE 0.57 0.60  CALCIUM 9.4 8.9   Liver Function Tests:  Recent Labs Lab 12/05/13 0435  AST 21  ALT 17  ALKPHOS 103  BILITOT 0.7  PROT 6.5  ALBUMIN 3.4*   No results found for this basename: LIPASE, AMYLASE,  in the last 168 hours No results found for this  basename: AMMONIA,  in the last 168 hours CBC:  Recent Labs Lab 12/04/13 1416 12/05/13 0435  WBC 18.1* 11.1*  NEUTROABS 15.2* 8.5*  HGB 12.8 11.6*  HCT 37.9 34.4*  MCV 90.5 90.1  PLT 225 216   Cardiac Enzymes: No results found for this basename: CKTOTAL, CKMB, CKMBINDEX, TROPONINI,  in the last 168 hours BNP (last 3 results) No results found for this basename: PROBNP,  in the last 8760 hours CBG: No results found for this basename: GLUCAP,  in the last 168 hours  Recent Results (from the past 240 hour(s))  SURGICAL PCR SCREEN     Status: None   Collection Time    12/06/13 12:54 AM      Result Value Ref Range Status   MRSA, PCR NEGATIVE  NEGATIVE Final   Staphylococcus aureus NEGATIVE  NEGATIVE Final   Comment:            The Xpert SA Assay (FDA     approved for NASAL specimens     in patients over 54 years of age),     is one component of     a comprehensive surveillance     program.  Test performance has  been validated by [facility] for patients greater     than or equal to 42 year old.     It is not intended     to diagnose infection nor to     guide or monitor treatment.     Studies: Dg Lumbar Spine Complete  12/04/2013   CLINICAL DATA:  Pain post trauma  EXAM: LUMBAR SPINE - COMPLETE 4+ VIEW  COMPARISON:  None.  FINDINGS: Frontal, lateral, spot lumbosacral lateral, and bilateral oblique views were obtained. There are 5 non-rib-bearing lumbar type vertebral bodies. Bones are osteoporotic. There is marked anterior wedging of the T12 vertebral body. There is milder anterior wedging at L1 and L3. There is also anterior wedging at T8, T9, and T10. There is no spondylolisthesis. There is moderately severe disc space narrowing at L5-S1. There is moderate narrowing at all other levels in the lumbar region. There is facet osteoarthritic change at all levels bilaterally. There is atherosclerotic change throughout the aorta.  IMPRESSION: Multiple age uncertain lumbar  and lower thoracic spine fractures. Bones diffusely osteoporotic. Extensive multilevel osteoarthritic change. Extensive atherosclerotic change.   Electronically Signed   By: Lowella Grip M.D.   On: 12/04/2013 12:09   Dg Elbow 2 Views Right  12/04/2013   CLINICAL DATA:  Pain post trauma  EXAM: RIGHT ELBOW - 2 VIEW  COMPARISON:  None.  FINDINGS: Frontal and lateral views were obtained. There is a fracture of the proximal ulna with displacement of fracture fragments in this area by 1.3 cm. There is marked surrounding soft tissue swelling with extensive joint effusion. No other fractures. No dislocation. Bones are osteoporotic.  IMPRESSION: Displaced fracture proximal ulna with marked soft tissue swelling and effusion. No dislocation apparent.   Electronically Signed   By: Lowella Grip M.D.   On: 12/04/2013 12:04   Dg Hip Complete Right  12/04/2013   CLINICAL DATA:  Pain post trauma  EXAM: RIGHT HIP - COMPLETE 2+ VIEW  COMPARISON:  None.  FINDINGS: Frontal pelvis as well as frontal and lateral right hip images were obtained. There are screws transfixing an old fracture in the subcapital femoral region on the right with tips of the screws in the proximal femoral head. No acute fracture dislocation. There is mild symmetric narrowing of both hip joints. Bones are osteoporotic.  IMPRESSION: Prior screw fixation on the right. No acute fracture or dislocation. Mild symmetric narrowing of both hip joints. Bones osteoporotic.   Electronically Signed   By: Lowella Grip M.D.   On: 12/04/2013 15:13   Ct Head Wo Contrast  12/04/2013   CLINICAL DATA:  Pain post trauma  EXAM: CT HEAD WITHOUT CONTRAST  TECHNIQUE: Contiguous axial images were obtained from the base of the skull through the vertex without intravenous contrast.  COMPARISON:  Sep 14, 2008  FINDINGS: There is mild generalized atrophy. There is no mass, hemorrhage, extra-axial fluid collection, or midline shift. There is small vessel disease  throughout the centra semiovale bilaterally. There is evidence of a prior small lacunar infarct in the head of the caudate nucleus on the left, stable. There is no acute appearing infarct. No new gray-white compartment lesion is identified.  Bony calvarium appears intact. The mastoid air cells are clear. There is a right parietal scalp hematoma.  IMPRESSION: Atrophy with small vessel disease, stable. No intracranial mass, hemorrhage, or extra-axial fluid. There is a right parietal scalp hematoma.   Electronically Signed   By: Lowella Grip M.D.  On: 12/04/2013 12:11   Ct Hip Right Wo Contrast  12/04/2013   CLINICAL DATA:  80 year old female right hip pain following fall. History of previous right hip fracture.  EXAM: CT OF THE RIGHT HIP WITHOUT CONTRAST  TECHNIQUE: Multidetector CT imaging was performed according to the standard protocol. Multiplanar CT image reconstructions were also generated.  COMPARISON:  None.  FINDINGS: A nondisplaced horizontal fracture of the greater trochanteric is noted.  3 surgical screws within the proximal right femur traversed A remote femoral neck fracture.  There is no evidence of subluxation, dislocation or complicating hardware features.  Diffuse osteopenia and degenerative changes within the right hip noted.  Degenerative changes in the lower lumbar spine are also identified.  IMPRESSION: Nondisplaced horizontal fracture of the greater trochanter.   Electronically Signed   By: Hassan Rowan M.D.   On: 12/04/2013 19:13    Scheduled Meds: . polyethylene glycol  17 g Oral Daily   Continuous Infusions:    Charlynne Cousins  [facility] Pager [phone]. If 8PM-8AM, please contact night-coverage at [email or web address], password [facility] 12/06/2013, 11:09 AM  LOS: 2 days      **Disclaimer: This note may have been dictated with voice recognition software. Similar sounding words can inadvertently be transcribed and this note may contain transcription errors which may not  have been corrected upon publication of note.**

## 2013-12-06 NOTE — Progress Notes (Signed)
First bath after midnight finished. Daughter states this was not explained to her by the MD. Hospital protocol explained and she declines to refuse. PCR swab obtained, daughter repeats same stance of MD and not having prior knowledge of event. Protocol again explained and apologies made for the lack of public knowledge regarding hospitals system protocols.

## 2013-12-06 NOTE — Anesthesia Preprocedure Evaluation (Signed)
Anesthesia Evaluation  Patient identified by MRN, date of birth, ID band Patient awake    Reviewed: Allergy & Precautions, H&P , NPO status , Patient's Chart, lab work & pertinent test results  Airway       Dental   Pulmonary          Cardiovascular     Neuro/Psych    GI/Hepatic   Endo/Other    Renal/GU      Musculoskeletal  (+) Arthritis -,   Abdominal   Peds  Hematology   Anesthesia Other Findings   Reproductive/Obstetrics                           Anesthesia Physical Anesthesia Plan  ASA: I  Anesthesia Plan: General   Post-op Pain Management:    Induction: Intravenous  Airway Management Planned: Oral ETT and LMA  Additional Equipment:   Intra-op Plan:   Post-operative Plan: Extubation in OR  Informed Consent: I have reviewed the patients History and Physical, chart, labs and discussed the procedure including the risks, benefits and alternatives for the proposed anesthesia with the patient or authorized representative who has indicated his/her understanding and acceptance.     Plan Discussed with: CRNA, Anesthesiologist and Surgeon  Anesthesia Plan Comments:         Anesthesia Quick Evaluation

## 2013-12-06 NOTE — Op Note (Addendum)
12/04/2013 - 12/06/2013  8:27 PM  PATIENT:  Vanessa Pace    PRE-OPERATIVE DIAGNOSIS:  right elbow fracture  POST-OPERATIVE DIAGNOSIS:  Same  PROCEDURE:  OPEN REDUCTION INTERNAL FIXATION (ORIF) ELBOW/OLECRANON FRACTURE-RIGHT  SURGEON:  Jilian West A., MD  ASSISTANT:   ANESTHESIA:   General  PREOPERATIVE INDICATIONS:  Vanessa Pace is a  80 y.o. female with a diagnosis of right elbow fracture who elected for surgical management.    The risks, benefits, and alternatives were discussed with the patient including but not limited to the risks of nonoperative treatment, versus surgical intervention including infection, bleeding, nerve injury, malunion, nonunion, the need for revision surgery, hardware prominence, hardware failure, the need for hardware removal, blood clots, & cardiopulmonary complications, and the patient verbalized understanding and a desire to proceed.  OPERATIVE IMPLANTS: Acumed standard size olecranon plate  OPERATIVE FINDINGS: 2 part olecranon fracture, with impaction of articular surface of the major fragment  OPERATIVE PROCEDURE: The patient was brought to the operating room following a regional block and placed in the supine position.  General anesthesia was administered, and IV antibiotics were  given.  Time out was performed and the upper extremity was prepped and draped in the usual sterile fashion and tourniquet was utilized at 250 mmHg.    Posterior incision was made and the fracture identified and cleaned.  The ulnar nerve was protected and not exposed.  The fracture was held anatomically with a clamp and provisionally pinned in place.  I then applied the precontoured olecranon plate into the appropriate position. C-arm was used to confirm reduction and plate position.  The plate was secured with distal interlocking screws first in the sliding hole and then the fracture was fixed proximally with unicortical locking screws and the long oblique en bloc screw.   The distal screws were also placed for added fixation.  Fluoroscopic evaluation of the fracture indicated some persisting likely articular impaction, and so the intravenous was let off the lateral aspect of the fracture and this was identified and elevated with a Soil scientist. Cancellous bone graft was placed to support the articular reduction, and then final fluoroscopic images were obtained.  The elbow articular reduction was congruent on c-arm and moved as unit. Because of the proximal nature the fracture, #2 FiberWire was brought through the triceps tendon with a Krakw stitch, and then tied distally, running the ends through parallel drill holes and tied over the top of the construct. This allowed for independent tendon fixation of the fracture as well.  The wounds were copiously irrigated and the periosteum closed over the distal half of the plate with 3-0 Vicryl suture, followed by 3-0 Vicryl deep dermal buried sutures and the skin and a running 4-0 Vicryl Rapide horizontal mattress suture. A bulky dressing was applied and she was awakened and taken to room stable condition, breathing spontaneously.  DISPOSITION: She will be returned to the floor to continue recovery, partial weightbearing on the right upper extremity with the use of a platform device.

## 2013-12-06 NOTE — Anesthesia Postprocedure Evaluation (Signed)
Anesthesia Post Note  Patient: Vanessa Pace  Procedure(s) Performed: Procedure(s) (LRB): OPEN REDUCTION INTERNAL FIXATION (ORIF) ELBOW/OLECRANON FRACTURE (Right)  Anesthesia type: General  Patient location: PACU  Post pain: Pain level controlled and Adequate analgesia  Post assessment: Post-op Vital signs reviewed, Patient's Cardiovascular Status Stable, Respiratory Function Stable, Patent Airway and Pain level controlled  Last Vitals:  Filed Vitals:   12/06/13 2042  BP: 158/91  Pulse: 81  Temp: 36 C  Resp: 19    Post vital signs: Reviewed and stable  Level of consciousness: awake, alert  and oriented  Complications: No apparent anesthesia complications

## 2013-12-06 NOTE — Progress Notes (Signed)
PT Cancellation Note  Patient Details Name: CHAUNTAE HULTS MRN: [id number] DOB: October 19, 1926   Cancelled Treatment:    Reason Eval/Treat Not Completed: Patient at procedure or test/unavailable. Patient scheduled for surgery this afternoon. Will follow up in AM   Robinette, Tonia Brooms 12/06/2013, 2:47 PM

## 2013-12-06 NOTE — Transfer of Care (Signed)
Immediate Anesthesia Transfer of Care Note  Patient: Vanessa Pace  Procedure(s) Performed: Procedure(s): OPEN REDUCTION INTERNAL FIXATION (ORIF) ELBOW/OLECRANON FRACTURE (Right)  Patient Location: PACU  Anesthesia Type:General  Level of Consciousness: awake and patient cooperative  Airway & Oxygen Therapy: Patient Spontanous Breathing and Patient connected to face mask oxygen  Post-op Assessment: Report given to PACU RN and Post -op Vital signs reviewed and stable  Post vital signs: Reviewed and stable  Complications: No apparent anesthesia complications

## 2013-12-07 LAB — VITAMIN D 25 HYDROXY (VIT D DEFICIENCY, FRACTURES): Vit D, 25-Hydroxy: 18 ng/mL — ABNORMAL LOW (ref 30–89)

## 2013-12-07 NOTE — Discharge Instructions (Signed)
Keep right arm bandage on, clean and dry Partial WB on R UE (may use platform device) WBAT R LE

## 2013-12-07 NOTE — Progress Notes (Signed)
[facility] PROGRESS NOTE  Assessment/Plan: Hip fracture/ Closed fracture of right ulna/ Leucocytosis - On-call orthopedic surgeon Dr. Marlou Sa was consulted s/p ORIF of elbow on 5.27.2015. - ct hip and x-ray of right elbow as below. - cont tylenol or narcotics for pain, add miralax. - pt consult pending.   Osteoporosis: - start vit d and calcium. - she refused bisphosphonate.  Code Status: Full code.  Family Communication: Patient's daughter at the bedside.  Disposition Plan: Admit to inpatient.     Consultants:  Orthopedic surgeron  Procedures:  X-ray of hip  Ct head  Ct hip  Right elbolw  Antibiotics: none HPI/Subjective: Still in pain  Objective: Filed Vitals:   12/06/13 2042 12/06/13 2107 12/07/13 0113 12/07/13 0554  BP: 158/91 135/90 160/83 154/81  Pulse: 81 79 96 96  Temp: 96.8 F (36 C) 97.7 F (36.5 C) 97.8 F (36.6 C) 97.4 F (36.3 C)  TempSrc:  Oral Oral Oral  Resp: 19 18 18 18   Height:      Weight:      SpO2: 99% 99% 95% 97%    Intake/Output Summary (Last 24 hours) at 12/07/13 0908 Last data filed at 12/07/13 0630  Gross per 24 hour  Intake   1390 ml  Output     55 ml  Net   1335 ml   Filed Weights   12/04/13 1021 12/04/13 2244  Weight: 40.37 kg (89 lb) 40.37 kg (89 lb)    Exam:  General: Alert, awake, oriented x3, in no acute distress.  HEENT: No bruits, no goiter.  Heart: Regular rate and rhythm,  Lungs: Good air movement, clear  Abdomen: Soft, nontender, nondistended, positive bowel sounds.     Data Reviewed: Basic Metabolic Panel:  Recent Labs Lab 12/04/13 1416 12/05/13 0435  NA 136* 134*  K 3.8 4.1  CL 94* 98  CO2 27 23  GLUCOSE 107* 94  BUN 9 14  CREATININE 0.57 0.60  CALCIUM 9.4 8.9   Liver Function Tests:  Recent Labs Lab 12/05/13 0435  AST 21  ALT 17  ALKPHOS 103  BILITOT 0.7  PROT 6.5  ALBUMIN 3.4*   No results found for this basename: LIPASE, AMYLASE,  in the last 168 hours No  results found for this basename: AMMONIA,  in the last 168 hours CBC:  Recent Labs Lab 12/04/13 1416 12/05/13 0435  WBC 18.1* 11.1*  NEUTROABS 15.2* 8.5*  HGB 12.8 11.6*  HCT 37.9 34.4*  MCV 90.5 90.1  PLT 225 216   Cardiac Enzymes: No results found for this basename: CKTOTAL, CKMB, CKMBINDEX, TROPONINI,  in the last 168 hours BNP (last 3 results) No results found for this basename: PROBNP,  in the last 8760 hours CBG: No results found for this basename: GLUCAP,  in the last 168 hours  Recent Results (from the past 240 hour(s))  SURGICAL PCR SCREEN     Status: None   Collection Time    12/06/13 12:54 AM      Result Value Ref Range Status   MRSA, PCR NEGATIVE  NEGATIVE Final   Staphylococcus aureus NEGATIVE  NEGATIVE Final   Comment:            The Xpert SA Assay (FDA     approved for NASAL specimens     in patients over 33 years of age),     is one component of     a comprehensive surveillance     program.  Test performance has  been validated by [facility] for patients greater     than or equal to 57 year old.     It is not intended     to diagnose infection nor to     guide or monitor treatment.     Studies: Dg Elbow 2 Views Right  2013-12-11   CLINICAL DATA:  ORIF of the right elbow fracture.  EXAM: DG C-ARM 61-120 MIN; RIGHT ELBOW - 2 VIEW  : COMPARISON:  Right upper radiograph 12/04/2013  FINDINGS: Two spot fluoroscopic views elbow were obtained following ORIF of a proximal ulnar fracture. Cortical plate and multiple screws are present in the proximal ulna. Fracture fragments are in near anatomic alignment. The joint is located. No complicating features identified.  IMPRESSION: ORIF of proximal ulnar fracture.   Electronically Signed   By: Curlene Dolphin M.D.   On: 12-11-2013 19:53   Dg C-arm 1-60 Min  December 11, 2013   CLINICAL DATA:  ORIF of the right elbow fracture.  EXAM: DG C-ARM 61-120 MIN; RIGHT ELBOW - 2 VIEW  : COMPARISON:  Right upper radiograph  12/04/2013  FINDINGS: Two spot fluoroscopic views elbow were obtained following ORIF of a proximal ulnar fracture. Cortical plate and multiple screws are present in the proximal ulna. Fracture fragments are in near anatomic alignment. The joint is located. No complicating features identified.  IMPRESSION: ORIF of proximal ulnar fracture.   Electronically Signed   By: Curlene Dolphin M.D.   On: 12-11-13 19:53    Scheduled Meds: . calcium carbonate  1,250 mg Oral BID WC  . clindamycin (CLEOCIN) IV  600 mg Intravenous To OR  . polyethylene glycol  17 g Oral Daily   Continuous Infusions:    Charlynne Cousins  [facility] Pager [phone]. If 8PM-8AM, please contact night-coverage at [email or web address], password [facility] 12/07/2013, 9:08 AM  LOS: 3 days      **Disclaimer: This note may have been dictated with voice recognition software. Similar sounding words can inadvertently be transcribed and this note may contain transcription errors which may not have been corrected upon publication of note.**

## 2013-12-07 NOTE — Progress Notes (Signed)
Subjective: HD 4 closed rx Right femur greater troch fx--WBAT POD 1 ORIF R olecranon fx--PWB for platform Had some nausea today, feels tired Tol PO dinner   Objective: Vital signs in last 24 hours: Temp:  [96.8 F (36 C)-98.6 F (37 C)] 97.2 F (36.2 C) (07/28 1700) Pulse Rate:  [71-96] 93 (07/28 1700) Resp:  [14-20] 18 (07/28 1700) BP: (130-164)/(58-93) 130/58 mmHg (07/28 1700) SpO2:  [94 %-100 %] 94 % (07/28 1700)  Intake/Output from previous day: 07/27 0701 - 07/28 0700 In: 1390 [P.O.:240; I.V.:1150] Out: 55 [Urine:25; Blood:30] Intake/Output this shift: Total I/O In: 360 [P.O.:360] Out: 100 [Urine:100]   Recent Labs  12/05/13 0435  HGB 11.6*    Recent Labs  12/05/13 0435  WBC 11.1*  RBC 3.82*  HCT 34.4*  PLT 216    Recent Labs  12/05/13 0435  NA 134*  K 4.1  CL 98  CO2 23  BUN 14  CREATININE 0.60  GLUCOSE 94  CALCIUM 8.9   No results found for this basename: LABPT, INR,  in the last 72 hours  R UE dressing clean/dry.  NVI R LE NVI  Assessment/Plan: Doing well overall.  Patient remains hospitalized due to impact of these combined injuries on mobility and inability to return immediately safely to home environment.  May go to SNF or possibly CIR.  PWB R LE--Dressing to remain on, clean and dry until f/u  WBAT L LE  RTC week of Aug 10 for re-check of wounds/xrays.   Vanessa Pace A. 12/07/2013, 5:58 PM

## 2013-12-07 NOTE — Progress Notes (Signed)
Rehab Admissions Coordinator Note:  Patient was screened by Camie Hauss Pace for appropriateness for an Inpatient Acute Rehab Consult.  At this time, we are recommending Inpatient Rehab consult.  Vanessa Pace 12/07/2013, 11:13 AM  I can be reached at [phone].

## 2013-12-07 NOTE — Progress Notes (Signed)
Physical Therapy Treatment Patient Details Name: Vanessa Pace MRN: [id number] DOB: 03-Apr-1927 Today's Date: 12/07/2013    History of Present Illness 80 y.o. female s/p mechanical fall resulting in Displaced right olecranon fracture and Nondisplaced small right greater trochanteric femur fracture    PT Comments    Pt demos improved mobility today and very eager to continue working on ambulation.  Feel pt may benefit from CIR consult for further therapies instead of SNF prior to returning to home.  Will continue to follow.    Follow Up Recommendations  CIR     Equipment Recommendations  3in1 (PT) Charity fundraiser)    Recommendations for Other Services Rehab consult;OT consult     Precautions / Restrictions Precautions Precautions: Fall Required Braces or Orthoses: Sling Restrictions Weight Bearing Restrictions: Yes RUE Weight Bearing: Non weight bearing RLE Weight Bearing: Weight bearing as tolerated    Mobility  Bed Mobility Overal bed mobility: Needs Assistance Bed Mobility: Supine to Sit     Supine to sit: Min assist;HOB elevated     General bed mobility comments: A with pad to bring hips to EOB.    Transfers Overall transfer level: Needs assistance Equipment used: Quad cane Transfers: Sit to/from Stand Sit to Stand: Min assist         General transfer comment: cues for UE use and getting closer prior to sitting.  MinA from toilet as well.    Ambulation/Gait Ambulation/Gait assistance: Min assist Ambulation Distance (Feet): 80 Feet Assistive device: Quad cane Gait Pattern/deviations: Step-to pattern;Decreased step length - left;Decreased stance time - right;Trunk flexed     General Gait Details: cues for coordinating gait and QC.  pt with multiple LOB requiring MinA to prevent fall, otherwise MinG during gait.     Stairs            Wheelchair Mobility    Modified Rankin (Stroke Patients Only)       Balance Overall balance assessment: Needs  assistance Sitting-balance support: No upper extremity supported;Feet supported Sitting balance-Leahy Scale: Fair     Standing balance support: Single extremity supported Standing balance-Leahy Scale: Poor                      Cognition Arousal/Alertness: Awake/alert Behavior During Therapy: WFL for tasks assessed/performed Overall Cognitive Status: Within Functional Limits for tasks assessed                      Exercises      General Comments        Pertinent Vitals/Pain "Oh, it's much better."  Did not rate.      Home Living                      Prior Function            PT Goals (current goals can now be found in the care plan section) Acute Rehab PT Goals Patient Stated Goal: Get better PT Goal Formulation: With patient Time For Goal Achievement: 12/12/13 Potential to Achieve Goals: Good Progress towards PT goals: Progressing toward goals    Frequency  Min 3X/week    PT Plan Discharge plan needs to be updated    Co-evaluation             End of Session Equipment Utilized During Treatment: Gait belt (R UE Sling) Activity Tolerance: Patient tolerated treatment well Patient left: in chair;with call bell/phone within reach;with family/visitor present  Time: 6945-0388 PT Time Calculation (min): 36 min  Charges:  $Gait Training: 8-22 mins $Therapeutic Activity: 8-22 mins                    G CodesCatarina Hartshorn, [place] 12/07/2013, 10:37 AM

## 2013-12-08 DIAGNOSIS — S72009A Fracture of unspecified part of neck of unspecified femur, initial encounter for closed fracture: Secondary | ICD-10-CM

## 2013-12-08 DIAGNOSIS — W19XXXA Unspecified fall, initial encounter: Secondary | ICD-10-CM

## 2013-12-08 DIAGNOSIS — S52021A Displaced fracture of olecranon process without intraarticular extension of right ulna, initial encounter for closed fracture: Secondary | ICD-10-CM

## 2013-12-08 DIAGNOSIS — S52023A Displaced fracture of olecranon process without intraarticular extension of unspecified ulna, initial encounter for closed fracture: Secondary | ICD-10-CM

## 2013-12-08 LAB — COMPREHENSIVE METABOLIC PANEL
ALT: 11 U/L (ref 0–35)
ANION GAP: 14 (ref 5–15)
AST: 17 U/L (ref 0–37)
Albumin: 2.8 g/dL — ABNORMAL LOW (ref 3.5–5.2)
Alkaline Phosphatase: 93 U/L (ref 39–117)
BUN: 14 mg/dL (ref 6–23)
CALCIUM: 9 mg/dL (ref 8.4–10.5)
CO2: 26 mEq/L (ref 19–32)
CREATININE: 0.6 mg/dL (ref 0.50–1.10)
Chloride: 95 mEq/L — ABNORMAL LOW (ref 96–112)
GFR calc non Af Amer: 81 mL/min — ABNORMAL LOW (ref >90)
GLUCOSE: 144 mg/dL — AB (ref 70–99)
Potassium: 4.2 mEq/L (ref 3.7–5.3)
Sodium: 135 mEq/L — ABNORMAL LOW (ref 137–147)
TOTAL PROTEIN: 6 g/dL (ref 6.0–8.3)
Total Bilirubin: 0.3 mg/dL (ref 0.3–1.2)

## 2013-12-08 LAB — CBC
HCT: 32.8 % — ABNORMAL LOW (ref 36.0–46.0)
HEMOGLOBIN: 10.9 g/dL — AB (ref 12.0–15.0)
MCH: 30.9 pg (ref 26.0–34.0)
MCHC: 33.2 g/dL (ref 30.0–36.0)
MCV: 92.9 fL (ref 78.0–100.0)
Platelets: 202 10*3/uL (ref 150–400)
RBC: 3.53 MIL/uL — ABNORMAL LOW (ref 3.87–5.11)
RDW: 13.1 % (ref 11.5–15.5)
WBC: 9.8 10*3/uL (ref 4.0–10.5)

## 2013-12-08 MED ORDER — VITAMIN D (ERGOCALCIFEROL) 1.25 MG (50000 UNIT) PO CAPS
50000.0000 [IU] | ORAL_CAPSULE | ORAL | Status: DC
  Administered 2013-12-08: 50000 [IU] via ORAL
  Filled 2013-12-08 (×2): qty 1

## 2013-12-08 NOTE — Care Management Note (Signed)
CARE MANAGEMENT NOTE 12/08/2013  Patient:  Vanessa Pace,Vanessa Pace   Account Number:  [id number]  Date Initiated:  12/08/2013  Documentation initiated by:  Ricki Miller  Subjective/Objective Assessment:   80 yr old female s/p right elbow ORIF     Action/Plan:   Patient for shortterm rehab vs CIR. CM will continue to follow.   Anticipated DC Date:  12/09/2013   Anticipated DC Plan:        DC Planning Services  CM consult      Choice offered to / List presented to:             Status of service:  In process, will continue to follow Medicare Important Message given?  YES (If response is "NO", the following Medicare IM given date fields will be blank) Date Medicare IM given:  12/08/2013 Medicare IM given by:  Ricki Miller Date Additional Medicare IM given:   Additional Medicare IM given by:    Discharge Disposition:    Per UR Regulation:  Reviewed for med. necessity/level of care/duration of stay

## 2013-12-08 NOTE — PMR Pre-admission (Signed)
PMR Admission Coordinator Pre-Admission Assessment  Patient: Vanessa Pace is an 80 y.o., female MRN: [id number] DOB: Mar 06, 1927 Height: 5' (152.4 cm) Weight: 40.37 kg (89 lb)              Insurance Information HMO: No    PPO:       PCP:       IPA:       80/20:       OTHER:   PRIMARY: Medicare A/B      Policy#: [id number]      Subscriber: Vanessa Pace CM Name:        Phone#:       Fax#:   Pre-Cert#:        Employer: Not employed Benefits:  Phone #:       Name: Checked in Vanessa Pace. Date: 04/12/94     Deduct: $1260      Out of Pocket Max: none      Life Max: unlimited CIR: 100%      SNF: 100 days Outpatient: 80%     Co-Pay: 20% Home Health: 100%      Co-Pay: none DME: 80%     Co-Pay: 20% Providers: patient's choice  SECONDARY: [employer]      Policy#: [id number]      Subscriber: Vanessa Pace CM Name:        Phone#:       Fax#:   Pre-Cert#:        Employer: Not employed Benefits:  Phone #: [phone]     Name:   Eff. Date:       Deduct:        Out of Pocket Max:        Life Max:   CIR:        SNF:   Outpatient:       Co-Pay:   Home Health:        Co-Pay:   DME:       Co-Pay:     Emergency Contact Information Contact Information   Name Relation Home Work [place] Daughter [phone]   Vanessa Pace, Vanessa Pace [phone]     [facility] Daughter [phone]  [phone]     Current Medical History  Patient Admitting Diagnosis:  Fall 12/04/2013 resulting in olecranon fracture as well as greater trochanter fracture on the right side.  History of Present Illness:  A 80 y.o. right-handed female with unremarkable past medical history admitted 12/04/2013 after a fall when she tripped over her cat in the house. The day prior to her fall, she was in the garden picking tomatoes. Patient independent prior to admission living with her family. She denied loss of consciousness. Small laceration on the scalp posterior aspect. Cranial CT scan negative  for any intracranial abnormalities. There was a small right parietal scalp hematoma. X-rays and imaging showed a displaced right olecranon fracture as well as nondisplaced small right greater trochanteric femur fracture. Underwent ORIF olecranon fracture 12/06/2013 per Dr. Grandville Silos and nonoperative conservative care with right trochanteric femur fracture. Nonweightbearing right upper extremity with platform walker weightbearing as tolerated right lower extremity. Hospital course pain management. Physical therapy evaluation completed an ongoing with recommendations for physical medicine rehabilitation consult.    Past Medical History  Past Medical History  Diagnosis Date  . Arthritis   . Family history of anesthesia complication     Daughter is difficult to intubate   . Elbow fracture 12/04/2013    rt elbow    Family History  family history includes CAD in her father and son; Cervical cancer in her daughter.  Prior Rehab/Hospitalizations:  Had therapy 10 -15 yrs ago after a right hip fracture.   Current Medications  Current facility-administered medications:acetaminophen (TYLENOL) tablet 650 mg, 650 mg, Oral, Q6H PRN, Rise Patience, MD, 650 mg at 12/09/13 4132;  calcium carbonate (OS-CAL - dosed in mg of elemental calcium) tablet 1,250 mg, 1,250 mg, Oral, BID WC, Charlynne Cousins, MD, 1,250 mg at 12/08/13 1752;  morphine 2 MG/ML injection 0.5 mg, 0.5 mg, Intravenous, Q2H PRN, Rise Patience, MD oxyCODONE (Oxy IR/ROXICODONE) immediate release tablet 5-10 mg, 5-10 mg, Oral, Q4H PRN, Rise Patience, MD, 5 mg at 12/07/13 1651;  polyethylene glycol (MIRALAX / GLYCOLAX) packet 17 g, 17 g, Oral, Daily, Charlynne Cousins, MD, 17 g at 12/08/13 1158;  Vitamin D (Ergocalciferol) (DRISDOL) capsule 50,000 Units, 50,000 Units, Oral, Q7 days, Reyne Dumas, MD, 50,000 Units at 12/08/13 1331  Patients Current Diet: General  Precautions / Restrictions Precautions Precautions:  Fall Restrictions Weight Bearing Restrictions: Yes RUE Weight Bearing: Non weight bearing RLE Weight Bearing: Weight bearing as tolerated   Prior Activity Level Community (5-7x/wk): Went out in yard daily.  Feeds 14 feral cats.  Went out 3-4 X a week.  Is involved in women's group through her church.  Drives short distances to the store in early am.  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[place] Devices/Equipment: Eyeglasses Home Equipment: None  Prior Functional Level Prior Function Level of Independence: Independent  Current Functional Level Cognition  Overall Cognitive Status: Within Functional Limits for tasks assessed Orientation Level: Oriented X4    Extremity Assessment (includes Sensation/Coordination)  Upper Extremity Assessment: Defer to OT evaluation  Lower Extremity Assessment: RLE deficits/detail  RLE Deficits / Details: Decreased strength and ROM - limited assessment due to pain    ADLs       Mobility  Overal bed mobility: Needs Assistance Bed Mobility: Supine to Sit Supine to sit: Min assist;HOB elevated General bed mobility comments: A with pad to bring hips to EOB.      Transfers  Overall transfer level: Needs assistance Equipment used: Quad cane Transfers: Sit to/from Stand Sit to Stand: Min assist General transfer comment: cues for UE use and getting closer prior to sitting.  MinA from toilet as well.      Ambulation / Gait / Stairs / Wheelchair Mobility  Ambulation/Gait Ambulation/Gait assistance: Museum/gallery curator (Feet): 80 Feet Assistive device: Quad cane Gait Pattern/deviations: Step-to pattern;Decreased step length - left;Decreased stance time - right;Trunk flexed Gait velocity interpretation: Below normal speed for age/gender General Gait Details: cues for coordinating gait and QC.  pt with multiple LOB requiring MinA to prevent fall, otherwise MinG during gait.      Posture / Balance Overall balance assessment: Needs  assistance;History of Falls  Sitting-balance support: No upper extremity supported;Feet supported  Sitting balance-Leahy Scale: Good  Standing balance support: Single extremity supported  Standing balance-Leahy Scale: Poor    Special needs/care consideration BiPAP/CPAP No CPM No Continuous Drip IV No  Dialysis No         Life Vest No Oxygen No Special Bed No Trach Size No Wound Vac (area) No      Skin Skin tears easily and bruises easily.  Has arthritis.  Has sling to right arm over right wrist with dressing to wrist area.  Bowel mgmt: Had BM on 12/06/13. Pt has concerns over being constipated. Bladder mgmt: Up on BSC, voiding WDL Diabetic mgmt No    Previous Home Environment Living Arrangements: Children Available Help at Discharge: Family;Other (Comment) (Cannot confirm 24 hour care at home.) Type of Home: [place]: One level Home Access: Stairs to enter Entrance Stairs-Rails: None Entrance Stairs-Number of Steps: 3 Home Care Services: No  Discharge Living Setting Plans for Discharge Living Setting: Patient's home;House;Lives with (comment) (Lives with son, Jonni Sanger and Olena Heckle.) Type of Home at Discharge: House Discharge Home Layout: One level Discharge Home Access: Stairs to enter Entrance Stairs-Number of Steps: 3 steps at back entrance.  Front has 6 brick steps and old wooden slats.  Side entrance with brick steps as well. Does the patient have any problems obtaining your medications?: No  Social/Family/Support Systems Patient Roles: Parent (Has 4 children.) Contact Information: Consuelo Suthers - daughter (h) [phone] Anticipated Caregiver: Dtrs and son Anticipated Caregiver's Contact Information: Fransisco Hertz - daughter (h) [phone] (c) [phone] Ability/Limitations of Caregiver: Son works in [employer], [employer] jobs.  Dtr, Arbie Cookey, works 8 am to about 6 pm.  Dtr [name] currently not working and lives in [place] and  can assist as needed. Per [name], the plan is for [name] to stay with pt during the day and Carol/Andy to help in the evenings. Caregiver Availability: Other (Comment) (Can get close to 24 hr supervision.) Discharge Plan Discussed with Primary Caregiver: Yes Is Caregiver In Agreement with Plan?: Yes Does Caregiver/Family have Issues with Lodging/Transportation while Pt is in Rehab?: No  Goals/Additional Needs Patient/Family Goal for Rehab: PT/OT Supervision Expected length of stay: 7-10 days Cultural Considerations: Currently attending an independent church Dietary Needs: Regular diet, thin liquids Equipment Needs: TBD Pt/Family Agrees to Admission and willing to participate: Yes Program Orientation Provided & Reviewed with Pt/Caregiver Including Roles  & Responsibilities: Yes  Decrease burden of Care through IP rehab admission: N/A  Possible need for SNF placement upon discharge: Not anticipated  Patient Condition: This patient's condition remains as documented in the consult dated 12/08/13, in which the Rehabilitation Physician determined and documented that the patient's condition is appropriate for intensive rehabilitative care in an inpatient rehabilitation facility. Will admit to inpatient rehab today.  Preadmission Screen Completed By:  Nanetta Batty, PT 12/09/2013 9:55 AM ______________________________________________________________________   Discussed status with Dr. Letta Pate on 12-09-13 at [phone] and received telephone approval for admission today.  Admission Coordinator:  Nanetta Batty, PT time 0955/Date 12/09/13

## 2013-12-08 NOTE — Progress Notes (Signed)
[facility] PROGRESS NOTE  Vanessa Pace UPJ:[id number] DOB: 03-28-1927 DOA: 12/04/2013 PCP: No PCP Per Patient  Assessment/Plan: Principal Problem:   Hip fracture Active Problems:   Closed fracture of right ulna   Leucocytosis   Fracture of olecranon process of right ulna   Assessment/Plan:  Hip fracture/ Closed fracture of right ulna/ Leucocytosis  - On-call orthopedic surgeon Dr. Marlou Sa was consulted s/p ORIF of elbow on 5.27.2015.  - ct hip and x-ray of right elbow as below.  - cont tylenol or narcotics for pain, add miralax.  SNF versus CIR consult Followup in orthopedics on August 10  Anemia Hemoglobin stable.   Osteoporosis:  - start vit d and calcium.  - she refused bisphosphonate.   Code Status: Full code.  Family Communication: Patient's daughter at the bedside.  Disposition Plan: Admit to inpatient.  Consultants:  Orthopedic surgeron Procedures:  X-ray of hip  Ct head  Ct hip  Right elbolw Antibiotics:  none   HPI/Subjective: Pleasant, appreciated of Dr.Abraham Aileen Fass, MD States that she has 14 cats at home, she fell while taking care of them  Objective: Filed Vitals:   12/07/13 2000 12/08/13 0000 12/08/13 0400 12/08/13 0532  BP:    132/56  Pulse:    94  Temp:    98.2 F (36.8 C)  TempSrc:    Oral  Resp: 17 18 17 18   Height:      Weight:      SpO2:    94%    Intake/Output Summary (Last 24 hours) at 12/08/13 1022 Last data filed at 12/08/13 0827  Gross per 24 hour  Intake    355 ml  Output    425 ml  Net    -70 ml    Exam:  General: Alert, awake, oriented x3, in no acute distress.  HEENT: No bruits, no goiter.  Heart: Regular rate and rhythm,  Lungs: Good air movement, clear  Abdomen: Soft, nontender, nondistended, positive bowel sounds.       Data Reviewed: Basic Metabolic Panel:  Recent Labs Lab 12/04/13 1416 12/05/13 0435  NA 136* 134*  K 3.8 4.1  CL 94* 98  CO2 27 23  GLUCOSE 107* 94  BUN 9 14   CREATININE 0.57 0.60  CALCIUM 9.4 8.9    Liver Function Tests:  Recent Labs Lab 12/05/13 0435  AST 21  ALT 17  ALKPHOS 103  BILITOT 0.7  PROT 6.5  ALBUMIN 3.4*   No results found for this basename: LIPASE, AMYLASE,  in the last 168 hours No results found for this basename: AMMONIA,  in the last 168 hours  CBC:  Recent Labs Lab 12/04/13 1416 12/05/13 0435 12/08/13 0851  WBC 18.1* 11.1* 9.8  NEUTROABS 15.2* 8.5*  --   HGB 12.8 11.6* 10.9*  HCT 37.9 34.4* 32.8*  MCV 90.5 90.1 92.9  PLT 225 216 202    Cardiac Enzymes: No results found for this basename: CKTOTAL, CKMB, CKMBINDEX, TROPONINI,  in the last 168 hours BNP (last 3 results) No results found for this basename: PROBNP,  in the last 8760 hours   CBG: No results found for this basename: GLUCAP,  in the last 168 hours  Recent Results (from the past 240 hour(s))  SURGICAL PCR SCREEN     Status: None   Collection Time    12/06/13 12:54 AM      Result Value Ref Range Status   MRSA, PCR NEGATIVE  NEGATIVE Final   Staphylococcus aureus NEGATIVE  NEGATIVE Final   Comment:            The Xpert SA Assay (FDA     approved for NASAL specimens     in patients over 75 years of age),     is one component of     a comprehensive surveillance     program.  Test performance has     been validated by [employer] for patients greater     than or equal to 53 year old.     It is not intended     to diagnose infection nor to     guide or monitor treatment.     Studies: Dg Lumbar Spine Complete  12/04/2013   CLINICAL DATA:  Pain post trauma  EXAM: LUMBAR SPINE - COMPLETE 4+ VIEW  COMPARISON:  None.  FINDINGS: Frontal, lateral, spot lumbosacral lateral, and bilateral oblique views were obtained. There are 5 non-rib-bearing lumbar type vertebral bodies. Bones are osteoporotic. There is marked anterior wedging of the T12 vertebral body. There is milder anterior wedging at L1 and L3. There is also anterior wedging at T8,  T9, and T10. There is no spondylolisthesis. There is moderately severe disc space narrowing at L5-S1. There is moderate narrowing at all other levels in the lumbar region. There is facet osteoarthritic change at all levels bilaterally. There is atherosclerotic change throughout the aorta.  IMPRESSION: Multiple age uncertain lumbar and lower thoracic spine fractures. Bones diffusely osteoporotic. Extensive multilevel osteoarthritic change. Extensive atherosclerotic change.   Electronically Signed   By: Lowella Grip M.D.   On: 12/04/2013 12:09   Dg Elbow 2 Views Right  12/06/2013   CLINICAL DATA:  ORIF of the right elbow fracture.  EXAM: DG C-ARM 61-120 MIN; RIGHT ELBOW - 2 VIEW  : COMPARISON:  Right upper radiograph 12/04/2013  FINDINGS: Two spot fluoroscopic views elbow were obtained following ORIF of a proximal ulnar fracture. Cortical plate and multiple screws are present in the proximal ulna. Fracture fragments are in near anatomic alignment. The joint is located. No complicating features identified.  IMPRESSION: ORIF of proximal ulnar fracture.   Electronically Signed   By: Curlene Dolphin M.D.   On: 12/06/2013 19:53   Dg Elbow 2 Views Right  12/04/2013   CLINICAL DATA:  Pain post trauma  EXAM: RIGHT ELBOW - 2 VIEW  COMPARISON:  None.  FINDINGS: Frontal and lateral views were obtained. There is a fracture of the proximal ulna with displacement of fracture fragments in this area by 1.3 cm. There is marked surrounding soft tissue swelling with extensive joint effusion. No other fractures. No dislocation. Bones are osteoporotic.  IMPRESSION: Displaced fracture proximal ulna with marked soft tissue swelling and effusion. No dislocation apparent.   Electronically Signed   By: Lowella Grip M.D.   On: 12/04/2013 12:04   Dg Hip Complete Right  12/04/2013   CLINICAL DATA:  Pain post trauma  EXAM: RIGHT HIP - COMPLETE 2+ VIEW  COMPARISON:  None.  FINDINGS: Frontal pelvis as well as frontal and lateral  right hip images were obtained. There are screws transfixing an old fracture in the subcapital femoral region on the right with tips of the screws in the proximal femoral head. No acute fracture dislocation. There is mild symmetric narrowing of both hip joints. Bones are osteoporotic.  IMPRESSION: Prior screw fixation on the right. No acute fracture or dislocation. Mild symmetric narrowing of both hip joints. Bones osteoporotic.   Electronically Signed  By: Lowella Grip M.D.   On: 12/04/2013 15:13   Ct Head Wo Contrast  12/04/2013   CLINICAL DATA:  Pain post trauma  EXAM: CT HEAD WITHOUT CONTRAST  TECHNIQUE: Contiguous axial images were obtained from the base of the skull through the vertex without intravenous contrast.  COMPARISON:  Sep 14, 2008  FINDINGS: There is mild generalized atrophy. There is no mass, hemorrhage, extra-axial fluid collection, or midline shift. There is small vessel disease throughout the centra semiovale bilaterally. There is evidence of a prior small lacunar infarct in the head of the caudate nucleus on the left, stable. There is no acute appearing infarct. No new gray-white compartment lesion is identified.  Bony calvarium appears intact. The mastoid air cells are clear. There is a right parietal scalp hematoma.  IMPRESSION: Atrophy with small vessel disease, stable. No intracranial mass, hemorrhage, or extra-axial fluid. There is a right parietal scalp hematoma.   Electronically Signed   By: Lowella Grip M.D.   On: 12/04/2013 12:11   Ct Hip Right Wo Contrast  12/04/2013   CLINICAL DATA:  80 year old female right hip pain following fall. History of previous right hip fracture.  EXAM: CT OF THE RIGHT HIP WITHOUT CONTRAST  TECHNIQUE: Multidetector CT imaging was performed according to the standard protocol. Multiplanar CT image reconstructions were also generated.  COMPARISON:  None.  FINDINGS: A nondisplaced horizontal fracture of the greater trochanteric is noted.  3  surgical screws within the proximal right femur traversed A remote femoral neck fracture.  There is no evidence of subluxation, dislocation or complicating hardware features.  Diffuse osteopenia and degenerative changes within the right hip noted.  Degenerative changes in the lower lumbar spine are also identified.  IMPRESSION: Nondisplaced horizontal fracture of the greater trochanter.   Electronically Signed   By: Hassan Rowan M.D.   On: 12/04/2013 19:13   Dg C-arm 1-60 Min  12/06/2013   CLINICAL DATA:  ORIF of the right elbow fracture.  EXAM: DG C-ARM 61-120 MIN; RIGHT ELBOW - 2 VIEW  : COMPARISON:  Right upper radiograph 12/04/2013  FINDINGS: Two spot fluoroscopic views elbow were obtained following ORIF of a proximal ulnar fracture. Cortical plate and multiple screws are present in the proximal ulna. Fracture fragments are in near anatomic alignment. The joint is located. No complicating features identified.  IMPRESSION: ORIF of proximal ulnar fracture.   Electronically Signed   By: Curlene Dolphin M.D.   On: 12/06/2013 19:53    Scheduled Meds: . calcium carbonate  1,250 mg Oral BID WC  . polyethylene glycol  17 g Oral Daily  . Vitamin D (Ergocalciferol)  50,000 Units Oral Q7 days   Continuous Infusions:   Principal Problem:   Hip fracture Active Problems:   Closed fracture of right ulna   Leucocytosis   Fracture of olecranon process of right ulna    Time spent: 40 minutes   [facility] Pager [phone]. If 8PM-8AM, please contact night-coverage at [email or web address], password [email or web address] 12/08/2013, 10:22 AM  LOS: 4 days

## 2013-12-08 NOTE — Progress Notes (Signed)
Rehab admissions - Evaluated for possible admission.  I met with patient and her daughter, Vanessa Pace.  Both are interested in acute inpatient rehab admission.  I spoke with Dr. Allyson Sabal who says patient likely ready tomorrow.  I will have my partner follow up in am for possible admission.  We do have beds available tomorrow.  Call me for questions.  #[phone]

## 2013-12-08 NOTE — Consult Note (Signed)
Physical Medicine and Rehabilitation Consult Reason for Consult: Displaced right olecranon fracture and nondisplaced right greater trochanteric femur fracture after a fall Referring Physician: [facility]   HPI: Vanessa Pace is a 80 y.o. right-handed female with unremarkable past medical history admitted 12/04/2013 after a fall when she tripped over her cat in the house. Patient independent prior to admission living with her family. She denied loss of consciousness. Small laceration on the scalp posterior aspect. Cranial CT scan negative for any intracranial abnormalities. There was a small right parietal scalp hematoma. X-rays and imaging showed a displaced right olecranon fracture as well as nondisplaced small right greater trochanteric femur fracture. Underwent ORIF olecranon fracture 12/06/2013 per Dr. Grandville Silos and nonoperative conservative care with right trochanteric femur fracture. Nonweightbearing right upper extremity with platform walker weightbearing as tolerated right lower extremity. Hospital course pain management. Physical therapy evaluation completed an ongoing with recommendations for physical medicine rehabilitation consult.  The day prior to her fall, she was in the garden picking tomatoes  Did not have any significant chronic pain issues prior to fall. She does complain of her ribs rubbing against her pelvic bones Review of Systems  Musculoskeletal: Positive for joint pain and myalgias.  Psychiatric/Behavioral: The patient has insomnia.   All other systems reviewed and are negative.  Past Medical History  Diagnosis Date  . Arthritis   . Family history of anesthesia complication     Daughter is difficult to intubate   . Elbow fracture 12/04/2013    rt elbow   Past Surgical History  Procedure Laterality Date  . Fracture surgery      right hip  . Femur surgery     Family History  Problem Relation Age of Onset  . CAD Father   . CAD Son   . Cervical cancer  Daughter    Social History:  reports that she has never smoked. She has never used smokeless tobacco. She reports that she drinks alcohol. She reports that she does not use illicit drugs. Allergies:  Allergies  Allergen Reactions  . Adhesive [Tape] Other (See Comments)    Tears skin  . Amoxicillin Rash  . Hydrocodone Nausea Only   Medications Prior to Admission  Medication Sig Dispense Refill  . aspirin EC 325 MG tablet Take 162.5 mg by mouth daily.      . DiphenhydrAMINE HCl, Sleep, (UNISOM SLEEPGELS) 50 MG CAPS Take 50 mg by mouth at bedtime.        Home: Home Living Family/patient expects to be discharged to:: Skilled nursing facility Living Arrangements: Children Available Help at Discharge: Family;Other (Comment) (Cannot confirm 24 hour care at home.) Type of Home: House Home Access: Stairs to enter [employer] of Steps: 3 Entrance Stairs-Rails: None Home Layout: One level Home Equipment: None  Functional History: Prior Function Level of Independence: Independent Functional Status:  Mobility: Bed Mobility Overal bed mobility: Needs Assistance Bed Mobility: Supine to Sit Supine to sit: Min assist;HOB elevated General bed mobility comments: A with pad to bring hips to EOB.   Transfers Overall transfer level: Needs assistance Equipment used: Quad cane Transfers: Sit to/from Stand Sit to Stand: Min assist General transfer comment: cues for UE use and getting closer prior to sitting.  MinA from toilet as well.   Ambulation/Gait Ambulation/Gait assistance: Min assist Ambulation Distance (Feet): 80 Feet Assistive device: Quad cane Gait Pattern/deviations: Step-to pattern;Decreased step length - left;Decreased stance time - right;Trunk flexed Gait velocity interpretation: Below normal speed for age/gender  General Gait Details: cues for coordinating gait and QC.  pt with multiple LOB requiring MinA to prevent fall, otherwise MinG during gait.      ADL:     Cognition: Cognition Overall Cognitive Status: Within Functional Limits for tasks assessed Orientation Level: Oriented X4 Cognition Arousal/Alertness: Awake/alert Behavior During Therapy: WFL for tasks assessed/performed Overall Cognitive Status: Within Functional Limits for tasks assessed  Blood pressure 132/56, pulse 94, temperature 98.2 F (36.8 C), temperature source Oral, resp. rate 18, height 5' (1.524 m), weight 40.37 kg (89 lb), SpO2 94.00%. Physical Exam  Vitals reviewed. Constitutional: She is oriented to person, place, and time. She appears well-developed.  HENT:  Head: Normocephalic.  Eyes: EOM are normal.  Neck: Normal range of motion. Neck supple. No thyromegaly present.  Cardiovascular: Normal rate and regular rhythm.   Respiratory: Effort normal and breath sounds normal. No respiratory distress.  GI: Soft. Bowel sounds are normal. She exhibits no distension.  Neurological: She is alert and oriented to person, place, and time.  Follows full commands  Skin: Skin is warm and dry.  Right upper extremity with soft cast and shoulder sling. Appropriately tender   left upper extremity 5/5 in deltoid, bicep, tricep, grip Right grip is 4 minus she has pain with range of motion of her right fingers Extremities show osteoarthritic changes bilateral fingers and toes Right lower extremity 4 minus hip flexion knee extensor ankle dorsi flexion plantarflexion Left lower extremity 5/5 hip flexion knee extension ankle dorsiflexion plantar flexion  No results found for this or any previous visit (from the past 24 hour(s)). Dg Elbow 2 Views Right  12/06/2013   CLINICAL DATA:  ORIF of the right elbow fracture.  EXAM: DG C-ARM 61-120 MIN; RIGHT ELBOW - 2 VIEW  : COMPARISON:  Right upper radiograph 12/04/2013  FINDINGS: Two spot fluoroscopic views elbow were obtained following ORIF of a proximal ulnar fracture. Cortical plate and multiple screws are present in the proximal ulna. Fracture  fragments are in near anatomic alignment. The joint is located. No complicating features identified.  IMPRESSION: ORIF of proximal ulnar fracture.   Electronically Signed   By: Curlene Dolphin M.D.   On: 12/06/2013 19:53   Dg C-arm 1-60 Min  12/06/2013   CLINICAL DATA:  ORIF of the right elbow fracture.  EXAM: DG C-ARM 61-120 MIN; RIGHT ELBOW - 2 VIEW  : COMPARISON:  Right upper radiograph 12/04/2013  FINDINGS: Two spot fluoroscopic views elbow were obtained following ORIF of a proximal ulnar fracture. Cortical plate and multiple screws are present in the proximal ulna. Fracture fragments are in near anatomic alignment. The joint is located. No complicating features identified.  IMPRESSION: ORIF of proximal ulnar fracture.   Electronically Signed   By: Curlene Dolphin M.D.   On: 12/06/2013 19:53    Assessment/Plan: Diagnosis: Fall 12/04/2013 resulting in olecranon fracture as well as greater trochanter fracture on the right side. 1. Does the need for close, 24 hr/day medical supervision in concert with the patient's rehab needs make it unreasonable for this patient to be served in a less intensive setting? Yes 2. Co-Morbidities requiring supervision/potential complications: Osteoporosis, minor head injury, nonweightbearing status right upper extremity, osteoarthritis 3. Due to bladder management, bowel management, safety, skin/wound care, disease management, medication administration, pain management and patient education, does the patient require 24 hr/day rehab nursing? Yes 4. Does the patient require coordinated care of a physician, rehab nurse, PT (1-2 hrs/day, 5 days/week) and OT (1-2 hrs/day, 5 days/week) to address  physical and functional deficits in the context of the above medical diagnosis(es)? Yes Addressing deficits in the following areas: balance, endurance, locomotion, strength, transferring, bowel/bladder control, bathing, dressing, feeding, grooming and toileting 5. Can the patient actively  participate in an intensive therapy program of at least 3 hrs of therapy per day at least 5 days per week? Yes 6. The potential for patient to make measurable gains while on inpatient rehab is good 7. Anticipated functional outcomes upon discharge from inpatient rehab are supervision  with PT, supervision with OT, n/a with SLP. 8. Estimated rehab length of stay to reach the above functional goals is: 7-10 days 9. Does the patient have adequate social supports to accommodate these discharge functional goals? Yes 10. Anticipated D/C setting: Home 11. Anticipated post D/C treatments: [place] therapy 12. Overall Rehab/Functional Prognosis: good  RECOMMENDATIONS: This patient's condition is appropriate for continued rehabilitative care in the following setting: CIR Patient has agreed to participate in recommended program. Yes Note that insurance prior authorization may be required for reimbursement for recommended care.  Comment: Patient lives with her son    12/08/2013

## 2013-12-09 ENCOUNTER — Inpatient Hospital Stay (HOSPITAL_COMMUNITY)
Admission: RE | Admit: 2013-12-09 | Discharge: 2013-12-21 | DRG: 945 | Disposition: A | Discharge status: Home Health | Payer: Medicare Other | Source: Intra-hospital | Attending: Physical Medicine & Rehabilitation | Admitting: Physical Medicine & Rehabilitation

## 2013-12-09 ENCOUNTER — Inpatient Hospital Stay (HOSPITAL_COMMUNITY): Payer: Medicare Other

## 2013-12-09 DIAGNOSIS — Z7901 Long term (current) use of anticoagulants: Secondary | ICD-10-CM

## 2013-12-09 DIAGNOSIS — W19XXXA Unspecified fall, initial encounter: Secondary | ICD-10-CM

## 2013-12-09 DIAGNOSIS — S52023A Displaced fracture of olecranon process without intraarticular extension of unspecified ulna, initial encounter for closed fracture: Secondary | ICD-10-CM | POA: Diagnosis present

## 2013-12-09 DIAGNOSIS — Y92009 Unspecified place in unspecified non-institutional (private) residence as the place of occurrence of the external cause: Secondary | ICD-10-CM | POA: Diagnosis not present

## 2013-12-09 DIAGNOSIS — Z886 Allergy status to analgesic agent status: Secondary | ICD-10-CM | POA: Diagnosis not present

## 2013-12-09 DIAGNOSIS — Z881 Allergy status to other antibiotic agents status: Secondary | ICD-10-CM

## 2013-12-09 DIAGNOSIS — S0083XA Contusion of other part of head, initial encounter: Secondary | ICD-10-CM | POA: Diagnosis present

## 2013-12-09 DIAGNOSIS — S72033A Displaced midcervical fracture of unspecified femur, initial encounter for closed fracture: Secondary | ICD-10-CM | POA: Diagnosis present

## 2013-12-09 DIAGNOSIS — S0100XA Unspecified open wound of scalp, initial encounter: Secondary | ICD-10-CM | POA: Diagnosis present

## 2013-12-09 DIAGNOSIS — Z8249 Family history of ischemic heart disease and other diseases of the circulatory system: Secondary | ICD-10-CM

## 2013-12-09 DIAGNOSIS — G4700 Insomnia, unspecified: Secondary | ICD-10-CM | POA: Diagnosis present

## 2013-12-09 DIAGNOSIS — Z79899 Other long term (current) drug therapy: Secondary | ICD-10-CM | POA: Diagnosis not present

## 2013-12-09 DIAGNOSIS — S72009A Fracture of unspecified part of neck of unspecified femur, initial encounter for closed fracture: Secondary | ICD-10-CM | POA: Diagnosis not present

## 2013-12-09 DIAGNOSIS — IMO0001 Reserved for inherently not codable concepts without codable children: Secondary | ICD-10-CM | POA: Diagnosis not present

## 2013-12-09 DIAGNOSIS — R4181 Age-related cognitive decline: Secondary | ICD-10-CM | POA: Diagnosis present

## 2013-12-09 DIAGNOSIS — S72009B Fracture of unspecified part of neck of unspecified femur, initial encounter for open fracture type I or II: Secondary | ICD-10-CM | POA: Diagnosis not present

## 2013-12-09 DIAGNOSIS — W010XXA Fall on same level from slipping, tripping and stumbling without subsequent striking against object, initial encounter: Secondary | ICD-10-CM | POA: Diagnosis present

## 2013-12-09 DIAGNOSIS — S0003XA Contusion of scalp, initial encounter: Secondary | ICD-10-CM | POA: Diagnosis present

## 2013-12-09 DIAGNOSIS — S1093XA Contusion of unspecified part of neck, initial encounter: Secondary | ICD-10-CM

## 2013-12-09 DIAGNOSIS — S72109A Unspecified trochanteric fracture of unspecified femur, initial encounter for closed fracture: Secondary | ICD-10-CM | POA: Diagnosis not present

## 2013-12-09 DIAGNOSIS — M19079 Primary osteoarthritis, unspecified ankle and foot: Secondary | ICD-10-CM | POA: Diagnosis present

## 2013-12-09 DIAGNOSIS — Z5189 Encounter for other specified aftercare: Principal | ICD-10-CM

## 2013-12-09 DIAGNOSIS — M79609 Pain in unspecified limb: Secondary | ICD-10-CM | POA: Diagnosis not present

## 2013-12-09 DIAGNOSIS — Z8049 Family history of malignant neoplasm of other genital organs: Secondary | ICD-10-CM

## 2013-12-09 DIAGNOSIS — M19049 Primary osteoarthritis, unspecified hand: Secondary | ICD-10-CM | POA: Diagnosis present

## 2013-12-09 DIAGNOSIS — R112 Nausea with vomiting, unspecified: Secondary | ICD-10-CM | POA: Diagnosis not present

## 2013-12-09 DIAGNOSIS — K5900 Constipation, unspecified: Secondary | ICD-10-CM | POA: Diagnosis present

## 2013-12-09 MED ORDER — OXYCODONE HCL 5 MG PO TABS: 5.0000 mg | ORAL_TABLET | ORAL | Status: DC | PRN

## 2013-12-09 MED ORDER — VITAMIN D (ERGOCALCIFEROL) 1.25 MG (50000 UNIT) PO CAPS: 50000.0000 [IU] | ORAL_CAPSULE | ORAL | Status: DC

## 2013-12-09 MED ORDER — ONDANSETRON HCL 4 MG/2ML IJ SOLN
4.0000 mg | Freq: Four times a day (QID) | INTRAMUSCULAR | Status: DC | PRN
  Administered 2013-12-10: 4 mg via INTRAVENOUS
  Filled 2013-12-09: qty 2

## 2013-12-09 MED ORDER — CALCIUM CARBONATE 1250 (500 CA) MG PO TABS
1250.0000 mg | ORAL_TABLET | Freq: Two times a day (BID) | ORAL | Status: DC
  Administered 2013-12-09 – 2013-12-21 (×24): 1250 mg via ORAL
  Filled 2013-12-09 (×27): qty 1

## 2013-12-09 MED ORDER — SORBITOL 70 % SOLN
30.0000 mL | Freq: Every day | Status: DC | PRN
  Administered 2013-12-20: 30 mL via ORAL
  Filled 2013-12-09: qty 30

## 2013-12-09 MED ORDER — VITAMIN D (ERGOCALCIFEROL) 1.25 MG (50000 UNIT) PO CAPS
50000.0000 [IU] | ORAL_CAPSULE | ORAL | Status: DC
  Administered 2013-12-15: 50000 [IU] via ORAL
  Filled 2013-12-09 (×2): qty 1

## 2013-12-09 MED ORDER — ONDANSETRON HCL 4 MG PO TABS
4.0000 mg | ORAL_TABLET | Freq: Four times a day (QID) | ORAL | Status: DC | PRN
  Administered 2013-12-13 – 2013-12-15 (×3): 4 mg via ORAL
  Filled 2013-12-09 (×3): qty 1

## 2013-12-09 MED ORDER — TRAZODONE HCL 50 MG PO TABS
25.0000 mg | ORAL_TABLET | Freq: Every evening | ORAL | Status: DC | PRN
  Administered 2013-12-09 – 2013-12-20 (×11): 25 mg via ORAL
  Filled 2013-12-09 (×12): qty 1

## 2013-12-09 MED ORDER — POLYETHYLENE GLYCOL 3350 17 G PO PACK: 17.0000 g | PACK | Freq: Every day | ORAL | Status: DC

## 2013-12-09 MED ORDER — OXYCODONE HCL 5 MG PO TABS
5.0000 mg | ORAL_TABLET | ORAL | Status: DC | PRN
  Administered 2013-12-10 – 2013-12-16 (×5): 5 mg via ORAL
  Administered 2013-12-16 – 2013-12-19 (×3): 10 mg via ORAL
  Filled 2013-12-09 (×2): qty 2
  Filled 2013-12-09: qty 1
  Filled 2013-12-09: qty 2
  Filled 2013-12-09 (×5): qty 1
  Filled 2013-12-09 (×2): qty 2

## 2013-12-09 MED ORDER — ACETAMINOPHEN 325 MG PO TABS
650.0000 mg | ORAL_TABLET | Freq: Four times a day (QID) | ORAL | Status: DC | PRN
  Administered 2013-12-09 – 2013-12-20 (×6): 650 mg via ORAL
  Filled 2013-12-09 (×6): qty 2

## 2013-12-09 MED ORDER — POLYETHYLENE GLYCOL 3350 17 G PO PACK
17.0000 g | PACK | Freq: Every day | ORAL | Status: DC
  Administered 2013-12-10 – 2013-12-21 (×12): 17 g via ORAL
  Filled 2013-12-09 (×13): qty 1

## 2013-12-09 NOTE — Progress Notes (Signed)
Patient stated that she wanted a medication to aid in sleeping, called P.A. Reesa Chew and received order for 25mg  trazodone at bedtime and prn.

## 2013-12-09 NOTE — Progress Notes (Signed)
[facility] Admission Coordinator Signed Physical Medicine and Rehabilitation PMR Pre-admission Service date: 12/08/2013 2:11 PM  PMR Admission Coordinator Pre-Admission Assessment   Patient: Vanessa Pace is an 80 y.o., female MRN: [id number] DOB: Apr 10, 1927 Height: 5' (152.4 cm) Weight: 40.37 kg (89 lb)                                                                                                                                                  Insurance Information HMO: No    PPO:       PCP:       IPA:       80/20:       OTHER:    PRIMARY: Medicare A/B      Policy#: [id number]      Subscriber: [name] CM Name:        Phone#:       Fax#:    Pre-Cert#:        Employer: Not employed Benefits:  Phone #:       Name: Checked in [place]. Date: 04/12/94     Deduct: $1260      Out of Pocket Max: none      Life Max: unlimited CIR: 100%      SNF: 100 days Outpatient: 80%     Co-Pay: 20% Home Health: 100%      Co-Pay: none DME: 80%     Co-Pay: 20% Providers: patient's choice  SECONDARY: [employer]      Policy#: [id number]      Subscriber: [name] CM Name:        Phone#:       Fax#:    Pre-Cert#:        Employer: Not employed Benefits:  Phone #: [phone]     Name:    Eff. Date:       Deduct:        Out of Pocket Max:        Life Max:    CIR:        SNF:    Outpatient:       Co-Pay:    Home Health:        Co-Pay:    DME:       Co-Pay:       Emergency Contact Information Contact Information     Name  Relation  Home  Work  [place]  Daughter  [phone]  [phone]  [phone]     [place], [place]  [phone]         [facility]  Daughter  [phone]    [phone]       Current Medical History  Patient Admitting Diagnosis:  Fall 12/04/2013 resulting in olecranon fracture as well as greater trochanter fracture on the right side.   History of Present Illness:  A  80 y.o. right-handed female with unremarkable past medical  history admitted 12/04/2013 after a fall when she tripped over her cat in the house. The day prior to her fall, she was in the garden picking tomatoes. Patient independent prior to admission living with her family. She denied loss of consciousness. Small laceration on the scalp posterior aspect. Cranial CT scan negative for any intracranial abnormalities. There was a small right parietal scalp hematoma. X-rays and imaging showed a displaced right olecranon fracture as well as nondisplaced small right greater trochanteric femur fracture. Underwent ORIF olecranon fracture 12/06/2013 per Dr. Grandville Silos and nonoperative conservative care with right trochanteric femur fracture. Nonweightbearing right upper extremity with platform walker weightbearing as tolerated right lower extremity. Hospital course pain management. Physical therapy evaluation completed an ongoing with recommendations for physical medicine rehabilitation consult.      Past Medical History  Past Medical History   Diagnosis  Date   .  Arthritis     .  Family history of anesthesia complication         Daughter is difficult to intubate    .  Elbow fracture  12/04/2013       rt elbow      Family History  family history includes CAD in her father and son; Cervical cancer in her daughter.   Prior Rehab/Hospitalizations:  Had therapy 10 -15 yrs ago after a right hip fracture.          Current Medications  Current facility-administered medications:acetaminophen (TYLENOL) tablet 650 mg, 650 mg, Oral, Q6H PRN, Rise Patience, MD, 650 mg at 12/09/13 2992;  calcium carbonate (OS-CAL - dosed in mg of elemental calcium) tablet 1,250 mg, 1,250 mg, Oral, BID WC, Charlynne Cousins, MD, 1,250 mg at 12/08/13 1752;  morphine 2 MG/ML injection 0.5 mg, 0.5 mg, Intravenous, Q2H PRN, Rise Patience, MD oxyCODONE (Oxy IR/ROXICODONE) immediate release tablet 5-10 mg, 5-10 mg, Oral, Q4H PRN, Rise Patience, MD, 5 mg at 12/07/13 1651;   polyethylene glycol (MIRALAX / GLYCOLAX) packet 17 g, 17 g, Oral, Daily, Charlynne Cousins, MD, 17 g at 12/08/13 1158;  Vitamin D (Ergocalciferol) (DRISDOL) capsule 50,000 Units, 50,000 Units, Oral, Q7 days, Reyne Dumas, MD, 50,000 Units at 12/08/13 1331   Patients Current Diet: General   Precautions / Restrictions Precautions Precautions: Fall Restrictions Weight Bearing Restrictions: Yes RUE Weight Bearing: Non weight bearing RLE Weight Bearing: Weight bearing as tolerated    Prior Activity Level Community (5-7x/wk): Went out in yard daily.  Feeds 14 feral cats.  Went out 3-4 X a week.  Is involved in women's group through her church.  Drives short distances to the store in early am.   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[place] Devices/Equipment: Eyeglasses Home Equipment: None   Prior Functional Level Prior Function Level of Independence: Independent   Current Functional Level Cognition   Overall Cognitive Status: Within Functional Limits for tasks assessed Orientation Level: Oriented X4     Extremity Assessment (includes Sensation/Coordination)   Upper Extremity Assessment: Defer to OT evaluation  Lower Extremity Assessment: RLE deficits/detail   RLE Deficits / Details: Decreased strength and ROM - limited assessment due to pain      ADLs        Mobility   Overal bed mobility: Needs Assistance Bed Mobility: Supine to Sit Supine to sit: Min assist;HOB elevated General bed mobility comments: A with pad to bring hips to EOB.       Transfers   Overall transfer  level: Needs assistance Equipment used: Quad cane Transfers: Sit to/from Stand Sit to Stand: Min assist General transfer comment: cues for UE use and getting closer prior to sitting.  MinA from toilet as well.       Ambulation / Gait / Stairs / Wheelchair Mobility   Ambulation/Gait Ambulation/Gait assistance: Museum/gallery curator (Feet): 80 Feet Assistive device: Quad cane Gait  Pattern/deviations: Step-to pattern;Decreased step length - left;Decreased stance time - right;Trunk flexed Gait velocity interpretation: Below normal speed for age/gender General Gait Details: cues for coordinating gait and QC.  pt with multiple LOB requiring MinA to prevent fall, otherwise MinG during gait.       Posture / Balance  Overall balance assessment: Needs assistance;History of Falls   Sitting-balance support: No upper extremity supported;Feet supported   Sitting balance-Leahy Scale: Good  Standing balance support: Single extremity supported   Standing balance-Leahy Scale: Poor      Special needs/care consideration  BiPAP/CPAP No CPM No Continuous Drip IV No   Dialysis No          Life Vest No Oxygen No Special Bed No Trach Size No Wound Vac (area) No       Skin Skin tears easily and bruises easily.  Has arthritis.  Has sling to right arm over right wrist with dressing to wrist area.                             Bowel mgmt: Had BM on 12/06/13. Pt has concerns over being constipated. Bladder mgmt: Up on BSC, voiding WDL Diabetic mgmt No       Previous Home Environment Living Arrangements: Children Available Help at Discharge: Family;Other (Comment) (Cannot confirm 24 hour care at home.) Type of Home: [place]: One level Home Access: Stairs to enter Entrance Stairs-Rails: None Entrance Stairs-Number of Steps: 3 Home Care Services: No   Discharge Living Setting Plans for Discharge Living Setting: Patient's home;House;Lives with (comment) (Lives with son, Jonni Sanger and Olena Heckle.) Type of Home at Discharge: House Discharge Home Layout: One level Discharge Home Access: Stairs to enter Entrance Stairs-Number of Steps: 3 steps at back entrance.  Front has 6 brick steps and old wooden slats.  Side entrance with brick steps as well. Does the patient have any problems obtaining your medications?: No   Social/Family/Support Systems Patient Roles: Parent (Has 4  children.) Contact Information: Para Cossey - daughter (h) [phone] ([phone] Anticipated Caregiver: Dtrs and son Anticipated Caregiver's Contact Information: Fransisco Hertz - daughter (h) [phone] (c) [phone] Ability/Limitations of Caregiver: Son works in [employer], [employer] jobs.  Dtr, Arbie Cookey, works 8 am to about 6 pm.  Dtr [name] currently not working and lives in [place] and can assist as needed. Per [name], the plan is for [name] to stay with pt during the day and Carol/Andy to help in the evenings. Caregiver Availability: Other (Comment) (Can get close to 24 hr supervision.) Discharge Plan Discussed with Primary Caregiver: Yes Is Caregiver In Agreement with Plan?: Yes Does Caregiver/Family have Issues with Lodging/Transportation while Pt is in Rehab?: No   Goals/Additional Needs Patient/Family Goal for Rehab: PT/OT Supervision Expected length of stay: 7-10 days Cultural Considerations: Currently attending an independent church Dietary Needs: Regular diet, thin liquids Equipment Needs: TBD Pt/Family Agrees to Admission and willing to participate: Yes Program Orientation Provided & Reviewed with Pt/Caregiver Including Roles  & Responsibilities: Yes   Decrease burden of Care through IP rehab admission: N/A  Possible need for SNF placement upon discharge: Not anticipated   Patient Condition: This patient's condition remains as documented in the consult dated 12/08/13, in which the Rehabilitation Physician determined and documented that the patient's condition is appropriate for intensive rehabilitative care in an inpatient rehabilitation facility. Will admit to inpatient rehab today.   Preadmission Screen Completed By:  Nanetta Batty, PT 12/09/2013 9:55 AM ______________________________________________________________________    Discussed status with Dr. Letta Pate on 12-09-13 at [phone] and received telephone approval for admission today.   Admission Coordinator:  Nanetta Batty, PT time 0955/Date 12/09/13          Cosigned by: Charlett Blake, MD    [12/09/2013 9:59 AM]  Revision History...     Date/Time User Action   12/09/2013 9:59 AM Charlett Blake, MD Cosign   12/09/2013 9:57 AM Sherlyn Hay Carlean Jews Shandra Szymborski Sign   12/08/2013 2:27 PM Retta Diones, RN Share  View Details Report     Charlett Blake, MD Physician Signed Physical Medicine and Rehabilitation Consult Note Service date: 12/08/2013 6:44 AM  Related encounter: Admission (Current) from 12/04/2013 in [facility]           Physical Medicine and Rehabilitation Consult Reason for Consult: Displaced right olecranon fracture and nondisplaced right greater trochanteric femur fracture after a fall Referring Physician: [facility]     HPI: BABE ANTHIS is a 80 y.o. right-handed female with unremarkable past medical history admitted 12/04/2013 after a fall when she tripped over her cat in the house. Patient independent prior to admission living with her family. She denied loss of consciousness. Small laceration on the scalp posterior aspect. Cranial CT scan negative for any intracranial abnormalities. There was a small right parietal scalp hematoma. X-rays and imaging showed a displaced right olecranon fracture as well as nondisplaced small right greater trochanteric femur fracture. Underwent ORIF olecranon fracture 12/06/2013 per Dr. Grandville Silos and nonoperative conservative care with right trochanteric femur fracture. Nonweightbearing right upper extremity with platform walker weightbearing as tolerated right lower extremity. Hospital course pain management. Physical therapy evaluation completed an ongoing with recommendations for physical medicine rehabilitation consult.   The day prior to her fall, she was in the garden picking tomatoes   Did not have any significant chronic pain issues prior to fall. She does complain of her ribs rubbing against her  pelvic bones Review of Systems  Musculoskeletal: Positive for joint pain and myalgias.  Psychiatric/Behavioral: The patient has insomnia.   All other systems reviewed and are negative. Past Medical History   Diagnosis  Date   .  Arthritis     .  Family history of anesthesia complication         Daughter is difficult to intubate    .  Elbow fracture  12/04/2013       rt elbow    Past Surgical History   Procedure  Laterality  Date   .  Fracture surgery           right hip   .  Femur surgery        Family History   Problem  Relation  Age of Onset   .  CAD  Father     .  CAD  Son     .  Cervical cancer  Daughter      Social History: reports that she has never smoked. She has never used smokeless tobacco. She reports that she drinks alcohol. She reports that she does  not use illicit drugs. Allergies:   Allergies   Allergen  Reactions   .  Adhesive [Tape]  Other (See Comments)       Tears skin   .  Amoxicillin  Rash   .  Hydrocodone  Nausea Only    Medications Prior to Admission   Medication  Sig  Dispense  Refill   .  aspirin EC 325 MG tablet  Take 162.5 mg by mouth daily.         .  DiphenhydrAMINE HCl, Sleep, (UNISOM SLEEPGELS) 50 MG CAPS  Take 50 mg by mouth at bedtime.            Home: Home Living Family/patient expects to be discharged to:: Skilled nursing facility Living Arrangements: Children Available Help at Discharge: Family;Other (Comment) (Cannot confirm 24 hour care at home.) Type of Home: House Home Access: Stairs to enter [employer] of Steps: 3 Entrance Stairs-Rails: None Home Layout: One level Home Equipment: None   Functional History: Prior Function Level of Independence: Independent Functional Status:   Mobility: Bed Mobility Overal bed mobility: Needs Assistance Bed Mobility: Supine to Sit Supine to sit: Min assist;HOB elevated General bed mobility comments: A with pad to bring hips to EOB.   Transfers Overall transfer level:  Needs assistance Equipment used: Quad cane Transfers: Sit to/from Stand Sit to Stand: Min assist General transfer comment: cues for UE use and getting closer prior to sitting.  MinA from toilet as well.   Ambulation/Gait Ambulation/Gait assistance: Min assist Ambulation Distance (Feet): 80 Feet Assistive device: Quad cane Gait Pattern/deviations: Step-to pattern;Decreased step length - left;Decreased stance time - right;Trunk flexed Gait velocity interpretation: Below normal speed for age/gender General Gait Details: cues for coordinating gait and QC.  pt with multiple LOB requiring MinA to prevent fall, otherwise MinG during gait.     ADL:   Cognition: Cognition Overall Cognitive Status: Within Functional Limits for tasks assessed Orientation Level: Oriented X4 Cognition Arousal/Alertness: Awake/alert Behavior During Therapy: WFL for tasks assessed/performed Overall Cognitive Status: Within Functional Limits for tasks assessed   Blood pressure 132/56, pulse 94, temperature 98.2 F (36.8 C), temperature source Oral, resp. rate 18, height 5' (1.524 m), weight 40.37 kg (89 lb), SpO2 94.00%. Physical Exam  Vitals reviewed. Constitutional: She is oriented to person, place, and time. She appears well-developed.  HENT:   Head: Normocephalic.  Eyes: EOM are normal.  Neck: Normal range of motion. Neck supple. No thyromegaly present.  Cardiovascular: Normal rate and regular rhythm.   Respiratory: Effort normal and breath sounds normal. No respiratory distress.  GI: Soft. Bowel sounds are normal. She exhibits no distension.  Neurological: She is alert and oriented to person, place, and time.  Follows full commands  Skin: Skin is warm and dry.  Right upper extremity with soft cast and shoulder sling. Appropriately tender  left upper extremity 5/5 in deltoid, bicep, tricep, grip Right grip is 4 minus she has pain with range of motion of her right fingers Extremities show  osteoarthritic changes bilateral fingers and toes Right lower extremity 4 minus hip flexion knee extensor ankle dorsi flexion plantarflexion Left lower extremity 5/5 hip flexion knee extension ankle dorsiflexion plantar flexion   No results found for this or any previous visit (from the past 24 hour(s)). Dg Elbow 2 Views Right   12/06/2013   CLINICAL DATA:  ORIF of the right elbow fracture.  EXAM: DG C-ARM 61-120 MIN; RIGHT ELBOW - 2 VIEW  : COMPARISON:  Right upper radiograph  12/04/2013  FINDINGS: Two spot fluoroscopic views elbow were obtained following ORIF of a proximal ulnar fracture. Cortical plate and multiple screws are present in the proximal ulna. Fracture fragments are in near anatomic alignment. The joint is located. No complicating features identified.  IMPRESSION: ORIF of proximal ulnar fracture.   Electronically Signed   By: Curlene Dolphin M.D.   On: 12/06/2013 19:53    Dg C-arm 1-60 Min   12/06/2013   CLINICAL DATA:  ORIF of the right elbow fracture.  EXAM: DG C-ARM 61-120 MIN; RIGHT ELBOW - 2 VIEW  : COMPARISON:  Right upper radiograph 12/04/2013  FINDINGS: Two spot fluoroscopic views elbow were obtained following ORIF of a proximal ulnar fracture. Cortical plate and multiple screws are present in the proximal ulna. Fracture fragments are in near anatomic alignment. The joint is located. No complicating features identified.  IMPRESSION: ORIF of proximal ulnar fracture.   Electronically Signed   By: Curlene Dolphin M.D.   On: 12/06/2013 19:53     Assessment/Plan: Diagnosis: Fall 12/04/2013 resulting in olecranon fracture as well as greater trochanter fracture on the right side. Does the need for close, 24 hr/day medical supervision in concert with the patient's rehab needs make it unreasonable for this patient to be served in a less intensive setting? Yes Co-Morbidities requiring supervision/potential complications: Osteoporosis, minor head injury, nonweightbearing status right upper  extremity, osteoarthritis Due to bladder management, bowel management, safety, skin/wound care, disease management, medication administration, pain management and patient education, does the patient require 24 hr/day rehab nursing? Yes Does the patient require coordinated care of a physician, rehab nurse, PT (1-2 hrs/day, 5 days/week) and OT (1-2 hrs/day, 5 days/week) to address physical and functional deficits in the context of the above medical diagnosis(es)? Yes Addressing deficits in the following areas: balance, endurance, locomotion, strength, transferring, bowel/bladder control, bathing, dressing, feeding, grooming and toileting Can the patient actively participate in an intensive therapy program of at least 3 hrs of therapy per day at least 5 days per week? Yes The potential for patient to make measurable gains while on inpatient rehab is good Anticipated functional outcomes upon discharge from inpatient rehab are supervision with PT, supervision with OT, n/a with SLP. Estimated rehab length of stay to reach the above functional goals is: 7-10 days Does the patient have adequate social supports to accommodate these discharge functional goals? Yes Anticipated D/C setting: Home Anticipated post D/C treatments: HH therapy Overall Rehab/Functional Prognosis: good   RECOMMENDATIONS: This patient's condition is appropriate for continued rehabilitative care in the following setting: CIR Patient has agreed to participate in recommended program. Yes Note that insurance prior authorization may be required for reimbursement for recommended care.   Comment: Patient lives with her son       12/08/2013    Revision History...     Date/Time User Action   12/08/2013 12:20 PM Charlett Blake, MD Sign   12/08/2013 7:07 AM Cathlyn Parsons, PA-C [place]  View Details Report   Routing History...     Date/Time From To Method   12/08/2013 12:20 PM Charlett Blake, MD Charlett Blake, MD  In Basket   12/08/2013 12:20 PM Charlett Blake, MD No Pcp Per Patient In Basket

## 2013-12-09 NOTE — Progress Notes (Signed)
Charlett Blake, MD Physician Signed Physical Medicine and Rehabilitation Consult Note Service date: 12/08/2013 6:44 AM  Related encounter: Admission (Current) from 12/04/2013 in [place]           Physical Medicine and Rehabilitation Consult Reason for Consult: Displaced right olecranon fracture and nondisplaced right greater trochanteric femur fracture after a fall Referring Physician: [facility]     HPI: Vanessa Pace is a 80 y.o. right-handed female with unremarkable past medical history admitted 12/04/2013 after a fall when she tripped over her cat in the house. Patient independent prior to admission living with her family. She denied loss of consciousness. Small laceration on the scalp posterior aspect. Cranial CT scan negative for any intracranial abnormalities. There was a small right parietal scalp hematoma. X-rays and imaging showed a displaced right olecranon fracture as well as nondisplaced small right greater trochanteric femur fracture. Underwent ORIF olecranon fracture 12/06/2013 per Dr. Grandville Silos and nonoperative conservative care with right trochanteric femur fracture. Nonweightbearing right upper extremity with platform walker weightbearing as tolerated right lower extremity. Hospital course pain management. Physical therapy evaluation completed an ongoing with recommendations for physical medicine rehabilitation consult.   The day prior to her fall, she was in the garden picking tomatoes   Did not have any significant chronic pain issues prior to fall. She does complain of her ribs rubbing against her pelvic bones Review of Systems  Musculoskeletal: Positive for joint pain and myalgias.  Psychiatric/Behavioral: The patient has insomnia.   All other systems reviewed and are negative. Past Medical History   Diagnosis  Date   .  Arthritis     .  Family history of anesthesia complication         Daughter is difficult to intubate     .  Elbow fracture  12/04/2013       rt elbow    Past Surgical History   Procedure  Laterality  Date   .  Fracture surgery           right hip   .  Femur surgery        Family History   Problem  Relation  Age of Onset   .  CAD  Father     .  CAD  Son     .  Cervical cancer  Daughter      Social History: reports that she has never smoked. She has never used smokeless tobacco. She reports that she drinks alcohol. She reports that she does not use illicit drugs. Allergies:   Allergies   Allergen  Reactions   .  Adhesive [Tape]  Other (See Comments)       Tears skin   .  Amoxicillin  Rash   .  Hydrocodone  Nausea Only    Medications Prior to Admission   Medication  Sig  Dispense  Refill   .  aspirin EC 325 MG tablet  Take 162.5 mg by mouth daily.         .  DiphenhydrAMINE HCl, Sleep, (UNISOM SLEEPGELS) 50 MG CAPS  Take 50 mg by mouth at bedtime.            Home: Home Living Family/patient expects to be discharged to:: Skilled nursing facility Living Arrangements: Children Available Help at Discharge: Family;Other (Comment) (Cannot confirm 24 hour care at home.) Type of Home: House Home Access: Stairs to enter [employer] of Steps: 3 Entrance Stairs-Rails: None Home Layout: One level Home Equipment:  None   Functional History: Prior Function Level of Independence: Independent Functional Status:   Mobility: Bed Mobility Overal bed mobility: Needs Assistance Bed Mobility: Supine to Sit Supine to sit: Min assist;HOB elevated General bed mobility comments: A with pad to bring hips to EOB.   Transfers Overall transfer level: Needs assistance Equipment used: Quad cane Transfers: Sit to/from Stand Sit to Stand: Min assist General transfer comment: cues for UE use and getting closer prior to sitting.  MinA from toilet as well.   Ambulation/Gait Ambulation/Gait assistance: Min assist Ambulation Distance (Feet): 80 Feet Assistive device: Quad cane Gait  Pattern/deviations: Step-to pattern;Decreased step length - left;Decreased stance time - right;Trunk flexed Gait velocity interpretation: Below normal speed for age/gender General Gait Details: cues for coordinating gait and QC.  pt with multiple LOB requiring MinA to prevent fall, otherwise MinG during gait.     ADL:   Cognition: Cognition Overall Cognitive Status: Within Functional Limits for tasks assessed Orientation Level: Oriented X4 Cognition Arousal/Alertness: Awake/alert Behavior During Therapy: WFL for tasks assessed/performed Overall Cognitive Status: Within Functional Limits for tasks assessed   Blood pressure 132/56, pulse 94, temperature 98.2 F (36.8 C), temperature source Oral, resp. rate 18, height 5' (1.524 m), weight 40.37 kg (89 lb), SpO2 94.00%. Physical Exam  Vitals reviewed. Constitutional: She is oriented to person, place, and time. She appears well-developed.  HENT:   Head: Normocephalic.  Eyes: EOM are normal.  Neck: Normal range of motion. Neck supple. No thyromegaly present.  Cardiovascular: Normal rate and regular rhythm.   Respiratory: Effort normal and breath sounds normal. No respiratory distress.  GI: Soft. Bowel sounds are normal. She exhibits no distension.  Neurological: She is alert and oriented to person, place, and time.  Follows full commands  Skin: Skin is warm and dry.  Right upper extremity with soft cast and shoulder sling. Appropriately tender  left upper extremity 5/5 in deltoid, bicep, tricep, grip Right grip is 4 minus she has pain with range of motion of her right fingers Extremities show osteoarthritic changes bilateral fingers and toes Right lower extremity 4 minus hip flexion knee extensor ankle dorsi flexion plantarflexion Left lower extremity 5/5 hip flexion knee extension ankle dorsiflexion plantar flexion   No results found for this or any previous visit (from the past 24 hour(s)). Dg Elbow 2 Views Right   12/06/2013    CLINICAL DATA:  ORIF of the right elbow fracture.  EXAM: DG C-ARM 61-120 MIN; RIGHT ELBOW - 2 VIEW  : COMPARISON:  Right upper radiograph 12/04/2013  FINDINGS: Two spot fluoroscopic views elbow were obtained following ORIF of a proximal ulnar fracture. Cortical plate and multiple screws are present in the proximal ulna. Fracture fragments are in near anatomic alignment. The joint is located. No complicating features identified.  IMPRESSION: ORIF of proximal ulnar fracture.   Electronically Signed   By: Curlene Dolphin M.D.   On: 12/06/2013 19:53    Dg C-arm 1-60 Min   12/06/2013   CLINICAL DATA:  ORIF of the right elbow fracture.  EXAM: DG C-ARM 61-120 MIN; RIGHT ELBOW - 2 VIEW  : COMPARISON:  Right upper radiograph 12/04/2013  FINDINGS: Two spot fluoroscopic views elbow were obtained following ORIF of a proximal ulnar fracture. Cortical plate and multiple screws are present in the proximal ulna. Fracture fragments are in near anatomic alignment. The joint is located. No complicating features identified.  IMPRESSION: ORIF of proximal ulnar fracture.   Electronically Signed   By: Audelia Acton.D.  On: 12/06/2013 19:53     Assessment/Plan: Diagnosis: Fall 12/04/2013 resulting in olecranon fracture as well as greater trochanter fracture on the right side. Does the need for close, 24 hr/day medical supervision in concert with the patient's rehab needs make it unreasonable for this patient to be served in a less intensive setting? Yes Co-Morbidities requiring supervision/potential complications: Osteoporosis, minor head injury, nonweightbearing status right upper extremity, osteoarthritis Due to bladder management, bowel management, safety, skin/wound care, disease management, medication administration, pain management and patient education, does the patient require 24 hr/day rehab nursing? Yes Does the patient require coordinated care of a physician, rehab nurse, PT (1-2 hrs/day, 5 days/week) and OT (1-2  hrs/day, 5 days/week) to address physical and functional deficits in the context of the above medical diagnosis(es)? Yes Addressing deficits in the following areas: balance, endurance, locomotion, strength, transferring, bowel/bladder control, bathing, dressing, feeding, grooming and toileting Can the patient actively participate in an intensive therapy program of at least 3 hrs of therapy per day at least 5 days per week? Yes The potential for patient to make measurable gains while on inpatient rehab is good Anticipated functional outcomes upon discharge from inpatient rehab are supervision with PT, supervision with OT, n/a with SLP. Estimated rehab length of stay to reach the above functional goals is: 7-10 days Does the patient have adequate social supports to accommodate these discharge functional goals? Yes Anticipated D/C setting: Home Anticipated post D/C treatments: HH therapy Overall Rehab/Functional Prognosis: good   RECOMMENDATIONS: This patient's condition is appropriate for continued rehabilitative care in the following setting: CIR Patient has agreed to participate in recommended program. Yes Note that insurance prior authorization may be required for reimbursement for recommended care.   Comment: Patient lives with her son       12/08/2013    Revision History...     Date/Time User Action   12/08/2013 12:20 PM Charlett Blake, MD Sign   12/08/2013 7:07 AM Cathlyn Parsons, PA-C [facility]  View Details Report   Routing History...     Date/Time From To Method   12/08/2013 12:20 PM Charlett Blake, MD Charlett Blake, MD In Basket   12/08/2013 12:20 PM Charlett Blake, MD No Pcp Per Patient In Basket

## 2013-12-09 NOTE — Plan of Care (Signed)
Problem: RH BOWEL ELIMINATION Goal: RH STG MANAGE BOWEL WITH ASSISTANCE STG Manage Bowel with min Assistance.  Outcome: Not Progressing Bed pan  Problem: RH BLADDER ELIMINATION Goal: RH STG MANAGE BLADDER WITH ASSISTANCE STG Manage Bladder With min Assistance  Outcome: Not Progressing Bed pan   Problem: RH SAFETY Goal: RH STG ADHERE TO SAFETY PRECAUTIONS W/ASSISTANCE/DEVICE STG Adhere to Safety Precautions With Min Assistance/Device.  Outcome: Progressing Uses call bell

## 2013-12-09 NOTE — H&P (Signed)
Physical Medicine and Rehabilitation Admission H&P  Chief Complaint   Patient presents with   .  Fall   :  HPI: Vanessa Pace is a 80 y.o. right-handed female with unremarkable past medical history admitted 12/04/2013 after a fall when she tripped over her cat in the house. Patient independent prior to admission living with her family. She denied loss of consciousness. Small laceration on the scalp posterior aspect. Cranial CT scan negative for any intracranial abnormalities. There was a small right parietal scalp hematoma. X-rays and imaging showed a displaced right olecranon fracture as well as nondisplaced small right greater trochanteric femur fracture. Underwent ORIF olecranon fracture 12/06/2013 per Dr. Grandville Silos and nonoperative conservative care with right trochanteric femur fracture. Partial weightbearing right upper extremity with platform walker and weightbearing as tolerated right lower extremity. Hospital course pain management. Physical therapy evaluation completed an ongoing with recommendations for physical medicine rehabilitation consult. Patient was admitted for comprehensive rehabilitation program   Recognized me from yesterday, asked whether I wanted tomatoes from her garden ROS Review of Systems  Musculoskeletal: Positive for joint pain and myalgias.  Psychiatric/Behavioral: The patient has insomnia.  All other systems reviewed and are negative  Past Medical History   Diagnosis  Date   .  Arthritis    .  Family history of anesthesia complication      Daughter is difficult to intubate   .  Elbow fracture  12/04/2013     rt elbow    Past Surgical History   Procedure  Laterality  Date   .  Fracture surgery       right hip   .  Femur surgery      Family History   Problem  Relation  Age of Onset   .  CAD  Father    .  CAD  Son    .  Cervical cancer  Daughter     Social History: reports that she has never smoked. She has never used smokeless tobacco. She reports that  she drinks alcohol. She reports that she does not use illicit drugs.  Allergies:  Allergies   Allergen  Reactions   .  Adhesive [Tape]  Other (See Comments)     Tears skin   .  Amoxicillin  Rash   .  Hydrocodone  Nausea Only    Medications Prior to Admission   Medication  Sig  Dispense  Refill   .  aspirin EC 325 MG tablet  Take 162.5 mg by mouth daily.     .  DiphenhydrAMINE HCl, Sleep, (UNISOM SLEEPGELS) 50 MG CAPS  Take 50 mg by mouth at bedtime.      Home:  Home Living  Family/patient expects to be discharged to:: Skilled nursing facility  Living Arrangements: Children  Available Help at Discharge: Family;Other (Comment) (Cannot confirm 24 hour care at home.)  Type of Home: House  Home Access: Stairs to enter  [employer] of Steps: 3  Entrance Stairs-Rails: None  Home Layout: One level  Home Equipment: None  Functional History:  Prior Function  Level of Independence: Independent  Functional Status:  Mobility:  Bed Mobility  Overal bed mobility: Needs Assistance  Bed Mobility: Supine to Sit  Supine to sit: Min assist;HOB elevated  General bed mobility comments: A with pad to bring hips to EOB.  Transfers  Overall transfer level: Needs assistance  Equipment used: Quad cane  Transfers: Sit to/from Stand  Sit to Stand: Min assist  General transfer comment: cues  for UE use and getting closer prior to sitting. MinA from toilet as well.  Ambulation/Gait  Ambulation/Gait assistance: Min assist  Ambulation Distance (Feet): 80 Feet  Assistive device: Quad cane  Gait Pattern/deviations: Step-to pattern;Decreased step length - left;Decreased stance time - right;Trunk flexed  Gait velocity interpretation: Below normal speed for age/gender  General Gait Details: cues for coordinating gait and QC. pt with multiple LOB requiring MinA to prevent fall, otherwise MinG during gait.   ADL:   Cognition:  Cognition  Overall Cognitive Status: Within Functional Limits for  tasks assessed  Orientation Level: Oriented X4  Cognition  Arousal/Alertness: Awake/alert  Behavior During Therapy: WFL for tasks assessed/performed  Overall Cognitive Status: Within Functional Limits for tasks assessed  Physical Exam:  Blood pressure 132/56, pulse 94, temperature 98.2 F (36.8 C), temperature source Oral, resp. rate 18, height 5' (1.524 m), weight 40.37 kg (89 lb), SpO2 94.00%.  Physical Exam  Constitutional: She is oriented to person, place, and time. She appears well-developed.  HENT:  Head: Normocephalic.  Eyes: EOM are normal.  Neck: Normal range of motion. Neck supple. No thyromegaly present.  Cardiovascular: Normal rate and regular rhythm.  Respiratory: Effort normal and breath sounds normal. No respiratory distress.  GI: Soft. Bowel sounds are normal. She exhibits no distension.  Neurological: She is alert and oriented to person, place, and time.  Follows full commands  Skin: Skin is warm and dry.  Right upper extremity with soft cast and shoulder sling. Appropriately tender  left upper extremity 5/5 in deltoid, bicep, tricep, grip  Right grip is 4 minus she has pain with range of motion of her right fingers  Extremities show osteoarthritic changes bilateral fingers and toes  Right lower extremity 4 minus hip flexion knee extensor ankle dorsi flexion plantarflexion  Left lower extremity 5/5 hip flexion knee extension ankle dorsiflexion plantar flexion  Results for orders placed during the hospital encounter of 12/04/13 (from the past 48 hour(s))   COMPREHENSIVE METABOLIC PANEL Status: Abnormal    Collection Time    12/08/13 8:51 AM   Result  Value  Ref Range    Sodium  135 (*)  137 - 147 mEq/L    Potassium  4.2  3.7 - 5.3 mEq/L    Chloride  95 (*)  96 - 112 mEq/L    CO2  26  19 - 32 mEq/L    Glucose, Bld  144 (*)  70 - 99 mg/dL    BUN  14  6 - 23 mg/dL    Creatinine, Ser  0.60  0.50 - 1.10 mg/dL    Calcium  9.0  8.4 - 10.5 mg/dL    Total Protein  6.0   6.0 - 8.3 g/dL    Albumin  2.8 (*)  3.5 - 5.2 g/dL    AST  17  0 - 37 U/L    ALT  11  0 - 35 U/L    Alkaline Phosphatase  93  39 - 117 U/L    Total Bilirubin  0.3  0.3 - 1.2 mg/dL    GFR calc non Af Amer  81 (*)  >90 mL/min    GFR calc Af Amer  >90  >90 mL/min    Comment:  (NOTE)     The eGFR has been calculated using the CKD EPI equation.     This calculation has not been validated in all clinical situations.     eGFR's persistently <90 mL/min signify possible Chronic Kidney  Disease.    Anion gap  14  5 - 15   CBC Status: Abnormal    Collection Time    12/08/13 8:51 AM   Result  Value  Ref Range    WBC  9.8  4.0 - 10.5 K/uL    RBC  3.53 (*)  3.87 - 5.11 MIL/uL    Hemoglobin  10.9 (*)  12.0 - 15.0 g/dL    HCT  32.8 (*)  36.0 - 46.0 %    MCV  92.9  78.0 - 100.0 fL    MCH  30.9  26.0 - 34.0 pg    MCHC  33.2  30.0 - 36.0 g/dL    RDW  13.1  11.5 - 15.5 %    Platelets  202  150 - 400 K/uL    Dg Elbow 2 Views Right  12/06/2013 CLINICAL DATA: ORIF of the right elbow fracture. EXAM: DG C-ARM 61-120 MIN; RIGHT ELBOW - 2 VIEW : COMPARISON: Right upper radiograph 12/04/2013 FINDINGS: Two spot fluoroscopic views elbow were obtained following ORIF of a proximal ulnar fracture. Cortical plate and multiple screws are present in the proximal ulna. Fracture fragments are in near anatomic alignment. The joint is located. No complicating features identified. IMPRESSION: ORIF of proximal ulnar fracture. Electronically Signed By: Curlene Dolphin M.D. On: 12/06/2013 19:53  Dg C-arm 1-60 Min  12/06/2013 CLINICAL DATA: ORIF of the right elbow fracture. EXAM: DG C-ARM 61-120 MIN; RIGHT ELBOW - 2 VIEW : COMPARISON: Right upper radiograph 12/04/2013 FINDINGS: Two spot fluoroscopic views elbow were obtained following ORIF of a proximal ulnar fracture. Cortical plate and multiple screws are present in the proximal ulna. Fracture fragments are in near anatomic alignment. The joint is located. No complicating  features identified. IMPRESSION: ORIF of proximal ulnar fracture. Electronically Signed By: Curlene Dolphin M.D. On: 12/06/2013 19:53   Medical Problem List and Plan:  1. Functional deficits secondary to fall sustaining a displaced right olecranon fracture and nondisplaced small right greater trochanteric femur fracture. Status post ORIF olecranon fracture. Nonweightbearing right upper extremity with platform walker weightbearing as tolerated right lower extremity  2. DVT Prophylaxis/Anticoagulation: SCDs. Monitor for any signs of DVT. Check vascular study  3. Pain Management: Oxycodone as needed. Monitor with increased mobility  4. Constipation. Adjust bowel program. No nausea vomiting  5. Neuropsych: This patient is capable of making decisions on her own behalf.  6. Skin/Wound Care: Routine skin checks  Post Admission Physician Evaluation:  1. Functional deficits secondary to secondary to fall sustaining a displaced right olecranon fracture and nondisplaced small right greater trochanteric femur fracture. . 2. Patient is admitted to receive collaborative, interdisciplinary care between the physiatrist, rehab nursing staff, and therapy team. 3. Patient's level of medical complexity and substantial therapy needs in context of that medical necessity cannot be provided at a lesser intensity of care such as a SNF. 4. Patient has experienced substantial functional loss from his/her baseline which was documented above under the "Functional History" and "Functional Status" headings. Judging by the patient's diagnosis, physical exam, and functional history, the patient has potential for functional progress which will result in measurable gains while on inpatient rehab. These gains will be of substantial and practical use upon discharge in facilitating mobility and self-care at the household level. 5. Physiatrist will provide 24 hour management of medical needs as well as oversight of the therapy plan/treatment  and provide guidance as appropriate regarding the interaction of the two. 6. 24 hour rehab nursing will assist with  bladder management, bowel management, safety, skin/wound care, disease management, medication administration, pain management, patient education and incentive spirometry and help integrate therapy concepts, techniques,education, etc. 7. PT will assess and treat for/with: pre gait, gait training, endurance , safety, equipment, neuromuscular re education, fall prevention. Goals are: Mod I. 8. OT will assess and treat for/with: ADLs, Cognitive perceptual skills, Neuromuscular re education, safety, endurance, equipment, fall prevention. Goals are: Mod I/Sup. 9. SLP will assess and treat for/with: cognition. Goals are: Mod I med management. 10. Case Management and Social Worker will assess and treat for psychological issues and discharge planning. 11. Team conference will be held weekly to assess progress toward goals and to determine barriers to discharge. 12. Patient will receive at least 3 hours of therapy per day at least 5 days per week. 13. ELOS: 7-10d  14. Prognosis: good  Charlett Blake M.D. [facility] FAAPM&R (Sports Med, Neuromuscular Med) Diplomate [place] Board of Electrodiagnostic Med

## 2013-12-09 NOTE — Progress Notes (Signed)
Patient ID: Vanessa Pace, female   DOB: August 02, 1926, 80 y.o.   MRN: [id number] Pt  Is admitted to 4W24 from 5N. Admission vital sign are stable

## 2013-12-09 NOTE — Discharge Summary (Addendum)
Physician Discharge Summary  Vanessa Pace MRN: [id number] DOB/AGE: [birth year] 80 y.o.  PCP: No PCP Per Patient   Admit date: 12/04/2013 Discharge date: 12/09/2013  Discharge Diagnoses:    closed reduction Right femur greater trochanteric fracture OPEN REDUCTION INTERNAL FIXATION (ORIF) ELBOW/OLECRANON FRACTURE    Closed fracture of right ulna   Leucocytosis   Fracture of olecranon process of right ulna  Follow up recommendations Patient being discharged to CIR    Medication List         aspirin EC 325 MG tablet  Take 162.5 mg by mouth daily.     oxyCODONE 5 MG immediate release tablet  Commonly known as:  Oxy IR/ROXICODONE  Take 1-2 tablets (5-10 mg total) by mouth every 4 (four) hours as needed for breakthrough pain ((for MODERATE breakthrough pain)).     oxyCODONE-acetaminophen 5-325 MG per tablet  Commonly known as:  PERCOCET/ROXICET  Take 1 tablet by mouth every 8 (eight) hours as needed for severe pain.     polyethylene glycol packet  Commonly known as:  MIRALAX / GLYCOLAX  Take 17 g by mouth daily.     UNISOM SLEEPGELS 50 MG Caps  Generic drug:  DiphenhydrAMINE HCl (Sleep)  Take 50 mg by mouth at bedtime.     Vitamin D (Ergocalciferol) 50000 UNITS Caps capsule  Commonly known as:  DRISDOL  Take 1 capsule (50,000 Units total) by mouth every 7 (seven) days.        Discharge Condition: Stable  Disposition: CIR   Consults:  Inpatient rehabilitation Orthopedics  Significant Diagnostic Studies: Dg Lumbar Spine Complete  12/04/2013   CLINICAL DATA:  Pain post trauma  EXAM: LUMBAR SPINE - COMPLETE 4+ VIEW  COMPARISON:  None.  FINDINGS: Frontal, lateral, spot lumbosacral lateral, and bilateral oblique views were obtained. There are 5 non-rib-bearing lumbar type vertebral bodies. Bones are osteoporotic. There is marked anterior wedging of the T12 vertebral body. There is milder anterior wedging at L1 and L3. There is also anterior wedging at T8, T9,  and T10. There is no spondylolisthesis. There is moderately severe disc space narrowing at L5-S1. There is moderate narrowing at all other levels in the lumbar region. There is facet osteoarthritic change at all levels bilaterally. There is atherosclerotic change throughout the aorta.  IMPRESSION: Multiple age uncertain lumbar and lower thoracic spine fractures. Bones diffusely osteoporotic. Extensive multilevel osteoarthritic change. Extensive atherosclerotic change.   Electronically Signed   By: Lowella Grip M.D.   On: 12/04/2013 12:09   Dg Elbow 2 Views Right  12/06/2013   CLINICAL DATA:  ORIF of the right elbow fracture.  EXAM: DG C-ARM 61-120 MIN; RIGHT ELBOW - 2 VIEW  : COMPARISON:  Right upper radiograph 12/04/2013  FINDINGS: Two spot fluoroscopic views elbow were obtained following ORIF of a proximal ulnar fracture. Cortical plate and multiple screws are present in the proximal ulna. Fracture fragments are in near anatomic alignment. The joint is located. No complicating features identified.  IMPRESSION: ORIF of proximal ulnar fracture.   Electronically Signed   By: Curlene Dolphin M.D.   On: 12/06/2013 19:53   Dg Elbow 2 Views Right  12/04/2013   CLINICAL DATA:  Pain post trauma  EXAM: RIGHT ELBOW - 2 VIEW  COMPARISON:  None.  FINDINGS: Frontal and lateral views were obtained. There is a fracture of the proximal ulna with displacement of fracture fragments in this area by 1.3 cm. There is marked surrounding soft tissue swelling with extensive joint effusion.  No other fractures. No dislocation. Bones are osteoporotic.  IMPRESSION: Displaced fracture proximal ulna with marked soft tissue swelling and effusion. No dislocation apparent.   Electronically Signed   By: Lowella Grip M.D.   On: 12/04/2013 12:04   Dg Hip Complete Right  12/04/2013   CLINICAL DATA:  Pain post trauma  EXAM: RIGHT HIP - COMPLETE 2+ VIEW  COMPARISON:  None.  FINDINGS: Frontal pelvis as well as frontal and lateral right  hip images were obtained. There are screws transfixing an old fracture in the subcapital femoral region on the right with tips of the screws in the proximal femoral head. No acute fracture dislocation. There is mild symmetric narrowing of both hip joints. Bones are osteoporotic.  IMPRESSION: Prior screw fixation on the right. No acute fracture or dislocation. Mild symmetric narrowing of both hip joints. Bones osteoporotic.   Electronically Signed   By: Lowella Grip M.D.   On: 12/04/2013 15:13   Ct Head Wo Contrast  12/04/2013   CLINICAL DATA:  Pain post trauma  EXAM: CT HEAD WITHOUT CONTRAST  TECHNIQUE: Contiguous axial images were obtained from the base of the skull through the vertex without intravenous contrast.  COMPARISON:  Sep 14, 2008  FINDINGS: There is mild generalized atrophy. There is no mass, hemorrhage, extra-axial fluid collection, or midline shift. There is small vessel disease throughout the centra semiovale bilaterally. There is evidence of a prior small lacunar infarct in the head of the caudate nucleus on the left, stable. There is no acute appearing infarct. No new gray-white compartment lesion is identified.  Bony calvarium appears intact. The mastoid air cells are clear. There is a right parietal scalp hematoma.  IMPRESSION: Atrophy with small vessel disease, stable. No intracranial mass, hemorrhage, or extra-axial fluid. There is a right parietal scalp hematoma.   Electronically Signed   By: Lowella Grip M.D.   On: 12/04/2013 12:11   Ct Hip Right Wo Contrast  12/04/2013   CLINICAL DATA:  80 year old female right hip pain following fall. History of previous right hip fracture.  EXAM: CT OF THE RIGHT HIP WITHOUT CONTRAST  TECHNIQUE: Multidetector CT imaging was performed according to the standard protocol. Multiplanar CT image reconstructions were also generated.  COMPARISON:  None.  FINDINGS: A nondisplaced horizontal fracture of the greater trochanteric is noted.  3 surgical  screws within the proximal right femur traversed A remote femoral neck fracture.  There is no evidence of subluxation, dislocation or complicating hardware features.  Diffuse osteopenia and degenerative changes within the right hip noted.  Degenerative changes in the lower lumbar spine are also identified.  IMPRESSION: Nondisplaced horizontal fracture of the greater trochanter.   Electronically Signed   By: Hassan Rowan M.D.   On: 12/04/2013 19:13   Dg Chest Port 1 View  12/09/2013   CLINICAL DATA:  Fever, right side chest pain  EXAM: PORTABLE CHEST - 1 VIEW  COMPARISON:  12/18/2012  FINDINGS: Cardiomediastinal silhouette is stable. Hyperinflation and chronic interstitial prominence again noted. Atherosclerotic calcifications of thoracic aorta. There is streaky right basilar atelectasis or early infiltrate.  IMPRESSION: Hyperinflation again noted. Chronic interstitial prominence. Streaky right basilar atelectasis or infiltrate. Follow-up to resolution is recommended.   Electronically Signed   By: Lahoma Crocker M.D.   On: 12/09/2013 10:24   Dg C-arm 1-60 Min  12/06/2013   CLINICAL DATA:  ORIF of the right elbow fracture.  EXAM: DG C-ARM 61-120 MIN; RIGHT ELBOW - 2 VIEW  : COMPARISON:  Right  upper radiograph 12/04/2013  FINDINGS: Two spot fluoroscopic views elbow were obtained following ORIF of a proximal ulnar fracture. Cortical plate and multiple screws are present in the proximal ulna. Fracture fragments are in near anatomic alignment. The joint is located. No complicating features identified.  IMPRESSION: ORIF of proximal ulnar fracture.   Electronically Signed   By: Curlene Dolphin M.D.   On: 12/06/2013 19:53      Microbiology: Recent Results (from the past 240 hour(s))  SURGICAL PCR SCREEN     Status: None   Collection Time    12/06/13 12:54 AM      Result Value Ref Range Status   MRSA, PCR NEGATIVE  NEGATIVE Final   Staphylococcus aureus NEGATIVE  NEGATIVE Final   Comment:            The Xpert SA  Assay (FDA     approved for NASAL specimens     in patients over 57 years of age),     is one component of     a comprehensive surveillance     program.  Test performance has     been validated by [employer] for patients greater     than or equal to 66 year old.     It is not intended     to diagnose infection nor to     guide or monitor treatment.     Labs: Results for orders placed during the hospital encounter of 12/04/13 (from the past 48 hour(s))  COMPREHENSIVE METABOLIC PANEL     Status: Abnormal   Collection Time    12/08/13  8:51 AM      Result Value Ref Range   Sodium 135 (*) 137 - 147 mEq/L   Potassium 4.2  3.7 - 5.3 mEq/L   Chloride 95 (*) 96 - 112 mEq/L   CO2 26  19 - 32 mEq/L   Glucose, Bld 144 (*) 70 - 99 mg/dL   BUN 14  6 - 23 mg/dL   Creatinine, Ser 0.60  0.50 - 1.10 mg/dL   Calcium 9.0  8.4 - 10.5 mg/dL   Total Protein 6.0  6.0 - 8.3 g/dL   Albumin 2.8 (*) 3.5 - 5.2 g/dL   AST 17  0 - 37 U/L   ALT 11  0 - 35 U/L   Alkaline Phosphatase 93  39 - 117 U/L   Total Bilirubin 0.3  0.3 - 1.2 mg/dL   GFR calc non Af Amer 81 (*) >90 mL/min   GFR calc Af Amer >90  >90 mL/min   Comment: (NOTE)     The eGFR has been calculated using the CKD EPI equation.     This calculation has not been validated in all clinical situations.     eGFR's persistently <90 mL/min signify possible Chronic Kidney     Disease.   Anion gap 14  5 - 15  CBC     Status: Abnormal   Collection Time    12/08/13  8:51 AM      Result Value Ref Range   WBC 9.8  4.0 - 10.5 K/uL   RBC 3.53 (*) 3.87 - 5.11 MIL/uL   Hemoglobin 10.9 (*) 12.0 - 15.0 g/dL   HCT 32.8 (*) 36.0 - 46.0 %   MCV 92.9  78.0 - 100.0 fL   MCH 30.9  26.0 - 34.0 pg   MCHC 33.2  30.0 - 36.0 g/dL   RDW 13.1  11.5 -  15.5 %   Platelets 202  150 - 400 K/uL     HPI :Vanessa Pace is a 80 y.o. right-handed female with unremarkable past medical history admitted 12/04/2013 after a fall when she tripped over her cat in the  house. Patient independent prior to admission living with her family. She denied loss of consciousness. Small laceration on the scalp posterior aspect. Cranial CT scan negative for any intracranial abnormalities. There was a small right parietal scalp hematoma. X-rays and imaging showed a displaced right olecranon fracture as well as nondisplaced small right greater trochanteric femur fracture. Underwent ORIF olecranon fracture 12/06/2013 per Dr. Grandville Silos and nonoperative conservative care with right trochanteric femur fracture. Partial weightbearing right upper extremity with platform walker and weightbearing as tolerated right lower extremity. Hospital course pain management. Physical therapy evaluation completed an ongoing with recommendations for physical medicine rehabilitation consult.    HOSPITAL COURSE:  Hip fracture/ Closed fracture of right ulna/ Leucocytosis  Jolyn Nap, MD performed s/p ORIF of elbow on 7.27.2015.  - ct hip and x-ray of right elbow as above  - cont tylenol or narcotics for pain, add miralax.  Past orthopedic recommendations, continue PWB R LE--Dressing to remain on, clean and dry until f/u  WBAT L LE CIR admission today Followup in orthopedics on August 10 for recheck of wound, x-rays Aspirin/SCDs for DVT prophylaxis Repeat CBC in one week  Anemia  Hemoglobin stable.   Osteoporosis:  - start vit d and calcium.  - she refused bisphosphonate.   Discharge Exam: * Blood pressure 169/97, pulse 101, temperature 98.3 F (36.8 C), temperature source Oral, resp. rate 18, height 5' (1.524 m), weight 40.37 kg (89 lb), SpO2 98.00%.  General: Alert, awake, oriented x3, in no acute distress.  HEENT: No bruits, no goiter.  Heart: Regular rate and rhythm,  Lungs: Good air movement, clear  Abdomen: Soft, nontender, nondistended, positive bowel sounds        Discharge Instructions   Diet - low sodium heart healthy    Complete by:  As directed      Increase  activity slowly    Complete by:  As directed            Follow-up Information   Schedule an appointment as soon as possible for a visit with Jolyn Nap., MD. (Week of August 10)    Specialty:  Orthopedic Surgery   Contact information:   [place] [id number] [phone]       Signed: Reyne Dumas 12/09/2013, 1:43 PM

## 2013-12-09 NOTE — Progress Notes (Signed)
Rehab admissions - I spoke with Dr. Allyson Sabal who stated that pt is medically cleared for inpatient rehab. Bed is available and will admit to inpatient rehab later today. Both patient and daughter are pleased with this news. I completed admission paperwork with pt's dtr [name] and answered her further questions. I updated Manuela Schwartz, case management and Marshell Levan with social work. I also updated pt's RN as well.  Please call me with any questions. Thanks.  Nanetta Batty, PT Rehabilitation Admissions Coordinator [phone]

## 2013-12-10 ENCOUNTER — Inpatient Hospital Stay (HOSPITAL_COMMUNITY): Payer: Medicare Other | Admitting: Occupational Therapy

## 2013-12-10 ENCOUNTER — Encounter (HOSPITAL_COMMUNITY): Payer: Self-pay | Admitting: Orthopedic Surgery

## 2013-12-10 ENCOUNTER — Inpatient Hospital Stay (HOSPITAL_COMMUNITY): Payer: Medicare Other

## 2013-12-10 DIAGNOSIS — S72009A Fracture of unspecified part of neck of unspecified femur, initial encounter for closed fracture: Secondary | ICD-10-CM

## 2013-12-10 DIAGNOSIS — Z5189 Encounter for other specified aftercare: Secondary | ICD-10-CM

## 2013-12-10 DIAGNOSIS — IMO0001 Reserved for inherently not codable concepts without codable children: Secondary | ICD-10-CM

## 2013-12-10 DIAGNOSIS — M79609 Pain in unspecified limb: Secondary | ICD-10-CM

## 2013-12-10 LAB — CBC WITH DIFFERENTIAL/PLATELET
BASOS ABS: 0 10*3/uL (ref 0.0–0.1)
Basophils Relative: 0 % (ref 0–1)
EOS ABS: 0.2 10*3/uL (ref 0.0–0.7)
Eosinophils Relative: 3 % (ref 0–5)
HCT: 35.8 % — ABNORMAL LOW (ref 36.0–46.0)
Hemoglobin: 11.7 g/dL — ABNORMAL LOW (ref 12.0–15.0)
Lymphocytes Relative: 21 % (ref 12–46)
Lymphs Abs: 1.9 10*3/uL (ref 0.7–4.0)
MCH: 30 pg (ref 26.0–34.0)
MCHC: 32.7 g/dL (ref 30.0–36.0)
MCV: 91.8 fL (ref 78.0–100.0)
Monocytes Absolute: 1.1 10*3/uL — ABNORMAL HIGH (ref 0.1–1.0)
Monocytes Relative: 12 % (ref 3–12)
Neutro Abs: 6 10*3/uL (ref 1.7–7.7)
Neutrophils Relative %: 64 % (ref 43–77)
Platelets: 292 10*3/uL (ref 150–400)
RBC: 3.9 MIL/uL (ref 3.87–5.11)
RDW: 13.2 % (ref 11.5–15.5)
WBC: 9.3 10*3/uL (ref 4.0–10.5)

## 2013-12-10 LAB — COMPREHENSIVE METABOLIC PANEL
ALK PHOS: 105 U/L (ref 39–117)
ALT: 15 U/L (ref 0–35)
AST: 24 U/L (ref 0–37)
Albumin: 3 g/dL — ABNORMAL LOW (ref 3.5–5.2)
Anion gap: 12 (ref 5–15)
BUN: 13 mg/dL (ref 6–23)
CO2: 28 mEq/L (ref 19–32)
Calcium: 9.3 mg/dL (ref 8.4–10.5)
Chloride: 96 mEq/L (ref 96–112)
Creatinine, Ser: 0.63 mg/dL (ref 0.50–1.10)
GFR calc Af Amer: >90 mL/min (ref >90)
GFR calc non Af Amer: 80 mL/min — ABNORMAL LOW (ref >90)
Glucose, Bld: 96 mg/dL (ref 70–99)
Potassium: 4.4 mEq/L (ref 3.7–5.3)
Sodium: 136 mEq/L — ABNORMAL LOW (ref 137–147)
Total Bilirubin: 0.4 mg/dL (ref 0.3–1.2)
Total Protein: 6.4 g/dL (ref 6.0–8.3)

## 2013-12-10 MED ORDER — WARFARIN VIDEO
1.0000 | Freq: Once | Status: AC
Start: 2013-12-11 — End: 2013-12-11
  Administered 2013-12-11: 1

## 2013-12-10 MED ORDER — WARFARIN SODIUM 2.5 MG PO TABS
2.5000 mg | ORAL_TABLET | Freq: Once | ORAL | Status: AC
  Administered 2013-12-10: 2.5 mg via ORAL
  Filled 2013-12-10: qty 1

## 2013-12-10 MED ORDER — RIVAROXABAN 20 MG PO TABS: 20.0000 mg | ORAL_TABLET | Freq: Every day | ORAL | Status: DC

## 2013-12-10 MED ORDER — WARFARIN - PHARMACIST DOSING INPATIENT
Freq: Every day | Status: DC
  Administered 2013-12-10: 18:00:00

## 2013-12-10 MED ORDER — BOOST / RESOURCE BREEZE PO LIQD
1.0000 | Freq: Three times a day (TID) | ORAL | Status: DC
  Administered 2013-12-10 – 2013-12-13 (×4): 1 via ORAL
  Administered 2013-12-14: 09:00:00 via ORAL
  Administered 2013-12-15 – 2013-12-20 (×8): 1 via ORAL

## 2013-12-10 MED ORDER — COUMADIN BOOK
Freq: Once | Status: AC
Start: 2013-12-10 — End: 2013-12-10
  Administered 2013-12-10: 16:00:00
  Filled 2013-12-10 (×2): qty 1

## 2013-12-10 MED ORDER — ENOXAPARIN SODIUM 40 MG/0.4ML ~~LOC~~ SOLN
40.0000 mg | Freq: Two times a day (BID) | SUBCUTANEOUS | Status: DC
  Administered 2013-12-10 – 2013-12-16 (×13): 40 mg via SUBCUTANEOUS
  Filled 2013-12-10 (×19): qty 0.4

## 2013-12-10 MED ORDER — RIVAROXABAN 15 MG PO TABS
15.0000 mg | ORAL_TABLET | Freq: Two times a day (BID) | ORAL | Status: DC
  Filled 2013-12-10 (×2): qty 1

## 2013-12-10 MED ORDER — COUMADIN BOOK
Freq: Once | Status: AC
  Administered 2013-12-10: 17:00:00
  Filled 2013-12-10: qty 1

## 2013-12-10 MED ORDER — WARFARIN VIDEO
Freq: Once | Status: AC
  Administered 2013-12-10: 16:00:00

## 2013-12-10 NOTE — Progress Notes (Addendum)
INITIAL NUTRITION ASSESSMENT  DOCUMENTATION CODES Per approved criteria  -Severe malnutrition in the context of chronic illness   Pt meets criteria for severe MALNUTRITION in the context of chronic illness as evidenced by severe fat and muscle depletion, <75% of estimated energy intake x 1 month.  INTERVENTION: -Resource Breeze po TID, each supplement provides 250 kcal and 9 grams of  -Downgrade diet to dysphagia 2 consistency for ease of intake  NUTRITION DIAGNOSIS: Inadequate oral intake related to decreased appetite, masticatory difficulty as evidenced by PO: 50%, severe fat and muscle depletion.   Goal: Pt will meet >90% of estimated nutritional needs  Monitor:  PO/supplement intake, labs, weight changes, I/O's  Reason for Assessment: low BMI  80 y.o. female  Admitting Dx: <principal problem not specified>  ASSESSMENT: Pt with unremarkable past medical history admitted 12/04/2013 after a fall when she tripped over her cat in the house. Patient independent prior to admission living with her family. She denied loss of consciousness. Small laceration on the scalp posterior aspect. Cranial CT scan negative for any intracranial abnormalities. There was a small right parietal scalp hematoma. X-rays and imaging showed a displaced right olecranon fracture as well as nondisplaced small right greater trochanteric femur fracture. Underwent ORIF olecranon fracture 12/06/2013 per Dr. Grandville Silos and nonoperative conservative care with right trochanteric femur fracture. Partial weightbearing right upper extremity with platform walker and weightbearing as tolerated right lower extremity. Hospital course pain management. Physical therapy evaluation completed an ongoing with recommendations for physical medicine rehabilitation consult. Patient was admitted for comprehensive rehabilitation program  Hx obtained from pt and daughter at bedside. Confirm poor appetite and weight loss. Pt reports UBW of 112#,  which she last weighed more than a year ago. Neither pt or daughter are able to provide any meaningful wt hx.  Pt reports that appetite is starting to improve. PO: 50% of meals. PTA daughter confirmed very poor appetite, with pt eating only small portions of meals. She has tried Ensure supplement at home, but does not like milky consistency. Daughter reports pt would take Slim Fast supplement at home. She is agreeable to try [employer]. Pt also complains of masticatory difficulty, particularly with meats. She reports that she chews meats "so much that I suck the flavor out of them". She is agreeable to diet downgrade for ease of intake. Educated pt on importance of good PO intake to support healing process and improve nutritional status.  Labs reviewed. Na: 136.   Nutrition Focused Physical Exam:  Subcutaneous Fat:  Orbital Region: severe depletion Upper Arm Region: severe depletion Thoracic and Lumbar Region: n/a  Muscle:  Temple Region: severe depletion Clavicle Bone Region: severe depletion Clavicle and Acromion Bone Region: severe depletion Scapular Bone Region: severe depletion Dorsal Hand: severe depletion Patellar Region: severe depletion Anterior Thigh Region: moderate depletion Posterior Calf Region: moderate depletion  Edema: none present  Height: Ht Readings from Last 1 Encounters:  12/04/13 5' (1.524 m)    Weight: Wt Readings from Last 1 Encounters:  12/09/13 87 lb 1.3 oz (39.5 kg)    Ideal Body Weight: 100#  % Ideal Body Weight: 87%  Wt Readings from Last 10 Encounters:  12/09/13 87 lb 1.3 oz (39.5 kg)  12/04/13 89 lb (40.37 kg)  12/04/13 89 lb (40.37 kg)  12/04/13 89 lb (40.37 kg)  12/04/13 89 lb (40.37 kg)   Usual Body Weight: 112#  % Usual Body Weight: 79%  BMI:  Body mass index is 17.01 kg/(m^2). Underweight  Estimated Nutritional  Needs: Kcal: 1200-1400 Protein: 50-60 grams Fluid: 1.2-1.4 L  Skin: rt arm ecchymosis  Diet Order:  General  EDUCATION NEEDS: -Education needs addressed   Intake/Output Summary (Last 24 hours) at 12/10/13 0912 Last data filed at 12/09/13 1700  Gross per 24 hour  Intake    240 ml  Output      0 ml  Net    240 ml    Last BM: 12/09/13  Labs:   Recent Labs Lab 12/05/13 0435 12/08/13 0851 12/10/13 0725  NA 134* 135* 136*  K 4.1 4.2 4.4  CL 98 95* 96  CO2 23 26 28   BUN 14 14 13   CREATININE 0.60 0.60 0.63  CALCIUM 8.9 9.0 9.3  GLUCOSE 94 144* 96    CBG (last 3)  No results found for this basename: GLUCAP,  in the last 72 hours  Scheduled Meds: . calcium carbonate  1,250 mg Oral BID WC  . polyethylene glycol  17 g Oral Daily  . [START ON 12/15/2013] Vitamin D (Ergocalciferol)  50,000 Units Oral Q7 days    Continuous Infusions:   Past Medical History  Diagnosis Date  . Arthritis   . Family history of anesthesia complication     Daughter is difficult to intubate   . Elbow fracture 12/04/2013    rt elbow    Past Surgical History  Procedure Laterality Date  . Fracture surgery      right hip  . Femur surgery      Nyx Keady A. Jimmye Norman, RD, LDN Pager: [phone] After hours Pager: [phone]

## 2013-12-10 NOTE — Progress Notes (Addendum)
Discussed results of dopplers with patient and daughter. Different medications/SE as well as options of IVC filter and no medications. Daughter reports falls due to tripping on vines in the yard or due to being knocked by son's 95 lbs dog. Pharmacy feels that NOAC is not an option due to patient's low weight. Information on coumadin and Lovenox given to patient and daughter who are willing to initiate this but plan on discussing it with family. Patient and family disappointed about lack of therapy due to bed rest aware of reasoning.

## 2013-12-10 NOTE — Progress Notes (Signed)
PHYSICAL MEDICINE & REHABILITATION     PROGRESS NOTE    Subjective/Complaints: No issues overnight. A little sore on right side. Able to sleep. Frustrated about ability to empty bladder. A  review of systems has been performed and if not noted above is otherwise negative.   Objective: Vital Signs: Blood pressure 143/81, pulse 84, temperature 97.5 F (36.4 C), temperature source Oral, resp. rate 18, weight 39.5 kg (87 lb 1.3 oz), SpO2 95.00%. Dg Chest Port 1 View  12/09/2013   CLINICAL DATA:  Fever, right side chest pain  EXAM: PORTABLE CHEST - 1 VIEW  COMPARISON:  12/18/2012  FINDINGS: Cardiomediastinal silhouette is stable. Hyperinflation and chronic interstitial prominence again noted. Atherosclerotic calcifications of thoracic aorta. There is streaky right basilar atelectasis or early infiltrate.  IMPRESSION: Hyperinflation again noted. Chronic interstitial prominence. Streaky right basilar atelectasis or infiltrate. Follow-up to resolution is recommended.   Electronically Signed   By: Lahoma Crocker M.D.   On: 12/09/2013 10:24    Recent Labs  12/08/13 0851 12/10/13 0725  WBC 9.8 9.3  HGB 10.9* 11.7*  HCT 32.8* 35.8*  PLT 202 292    Recent Labs  12/08/13 0851 12/10/13 0725  NA 135* 136*  K 4.2 4.4  CL 95* 96  GLUCOSE 144* 96  BUN 14 13  CREATININE 0.60 0.63  CALCIUM 9.0 9.3   CBG (last 3)  No results found for this basename: GLUCAP,  in the last 72 hours  Wt Readings from Last 3 Encounters:  12/09/13 39.5 kg (87 lb 1.3 oz)  12/04/13 40.37 kg (89 lb)  12/04/13 40.37 kg (89 lb)    Physical Exam:  Constitutional: She is oriented to person, place, and time. She appears well-developed.  HENT: oral mucosa moist Head: Normocephalic.  Eyes: EOM are normal.  Neck: Normal range of motion. Neck supple. No thyromegaly present.  Cardiovascular: Normal rate and regular rhythm. murmur Respiratory: Effort normal and breath sounds normal. No respiratory distress. No  wheezes GI: Soft. Bowel sounds are normal. She exhibits no distension. No pain Neurological: She is alert and oriented to person, place, and time.  Follows full commands. hoh. Skin: Skin is warm and dry. Scattered ecchymoses and bruises Right upper extremity with soft cast and shoulder sling. Appropriately tender  left upper extremity 5/5 in deltoid, bicep, tricep, grip  Right grip is 4 minus she has pain with range of motion of her right fingers  Extremities show osteoarthritic changes bilateral fingers and toes  Right lower extremity 4 minus hip flexion knee extensor ankle dorsi flexion plantarflexion  Left lower extremity 5/5 hip flexion knee extension ankle dorsiflexion plantar flexion  Psych: pleasant and cooperative   Assessment/Plan: 1. Functional deficits secondary to right olecranon/greater troch fx's which require 3+ hours per day of interdisciplinary therapy in a comprehensive inpatient rehab setting. Physiatrist is providing close team supervision and 24 hour management of active medical problems listed below. Physiatrist and rehab team continue to assess barriers to discharge/monitor patient progress toward functional and medical goals. FIM:                   Comprehension Comprehension Mode: Auditory Comprehension: 5-Understands complex 90% of the time/Cues < 10% of the time  Expression Expression: 5-Expresses complex 90% of the time/cues < 10% of the time  Social Interaction Social Interaction: 7-Interacts appropriately with others - No medications needed.  Problem Solving Problem Solving: 5-Solves complex 90% of the time/cues < 10% of the time  Memory Memory:  5-Recognizes or recalls 90% of the time/requires cueing < 10% of the time  Medical Problem List and Plan:  1. Functional deficits secondary to fall sustaining a displaced right olecranon fracture and nondisplaced small right greater trochanteric femur fracture. Status post ORIF olecranon fracture.  Nonweightbearing right upper extremity with platform walker weightbearing as tolerated right lower extremity  2. DVT Prophylaxis/Anticoagulation: SCDs. Monitor for any signs of DVT. Check vascular study today  3. Pain Management: Oxycodone as needed. Monitor with increased mobility  4. Constipation. Adjusted bowel program. No bm yet 5. Neuropsych: This patient is capable of making decisions on her own behalf.  6. Skin/Wound Care: Routine skin checks for now  LOS (Days) 1 A FACE TO FACE EVALUATION WAS PERFORMED  Vanessa Pace T 12/10/2013 8:48 AM

## 2013-12-10 NOTE — Consult Note (Signed)
PHARMACY CONSULT NOTE - Initial Consult  Pharmacy Consult for : Coumadin with Lovenox bridging  Indication: Bilateral LE DVT's  Allergies Allergies  Allergen Reactions  . Adhesive [Tape] Other (See Comments)    Tears skin  . Amoxicillin Rash  . Hydrocodone Nausea Only    Patient Measurements: Dosing weight 39.5 kg  Vital Signs: BP 108/67  Pulse 91  Temp(Src) 98 F (36.7 C) (Oral)  Resp 16  Wt 87 lb 1.3 oz (39.5 kg)  SpO2 95%  Labs:  Recent Labs  12/08/13 0851 12/10/13 0725  HGB 10.9* 11.7*  HCT 32.8* 35.8*  PLT 202 292  CREATININE 0.60 0.63   Estimated CrCl > 50 ml/miin  Medical History: Past Medical History  Diagnosis Date  . Arthritis   . Family history of anesthesia complication     Daughter is difficult to intubate   . Elbow fracture 12/04/2013    rt elbow   Past Surgical History  Procedure Laterality Date  . Fracture surgery      right hip  . Femur surgery    . Orif elbow fracture Right 12/06/2013    Procedure: OPEN REDUCTION INTERNAL FIXATION (ORIF) ELBOW/OLECRANON FRACTURE;  Surgeon: Jolyn Nap, MD;  Location: [place];  Service: Orthopedics;  Laterality: Right;    Current Medication[s] Include: Scheduled:  Scheduled:  . calcium carbonate  1,250 mg Oral BID WC  . feeding supplement (RESOURCE BREEZE)  1 Container Oral TID BM  . polyethylene glycol  17 g Oral Daily  . [START ON 12/15/2013] Vitamin D (Ergocalciferol)  50,000 Units Oral Q7 days  . warfarin   Does not apply Once   Infusion[s]: Infusions:  None Antibiotic[s]: Anti-infectives   None     Assessment:  80 y/o female admitted following a fall and olecranon and femur fracture.  S/p ORIF for olecranon fracture and non-surgical care for femur fracture.  She was transferred to inpatient Rehab.  Venous Duplex today reveals B-LE DVT's.  Lovenox and Coumadin ordered per Pharmacy consult.    Xarelto initially ordered but discontinued prior to receiving any doses.  Goal of  Therapy:  INR goal is 2-3    Lovenox Anti-Xa level 0.6-1 units/ml 4hrs after LMWH dose given  Plan:  1. Lovenox 40 mg sq q 12 hours. 2. Coumadin 2.5 mg po tonight. Daily INR's, Platelet count, CBC.  Monitor for bleeding complications. Begin Coumadin discharge education.  Mintie Witherington, Craig Guess,  Pharm.D  12/10/2013,  4:40 PM

## 2013-12-10 NOTE — Progress Notes (Signed)
Patient information reviewed and entered into eRehab system by Gillis Boardley, RN, CRRN, PPS Coordinator.  Information including medical coding and functional independence measure will be reviewed and updated through discharge.     Per nursing patient was given "Data Collection Information Summary for Patients in Inpatient Rehabilitation Facilities with attached "Privacy Act Statement-Health Care Records" upon admission.  

## 2013-12-10 NOTE — Evaluation (Signed)
Physical Therapy Assessment and Plan  Patient Details  Name: Vanessa Pace MRN: [id number] Date of Birth: Nov 16, 1926  PT Diagnosis: Abnormal posture, Difficulty walking, Edema, Muscle weakness and Pain in joint, Decreased Balance, Decreased functional endurance Rehab Potential: Good ELOS: 12-14days   Today's Date: 12/10/2013 Time: 0900-1000 Time Calculation (min): 60 min  Problem List:  Patient Active Problem List   Diagnosis Date Noted  . Fall 12/09/2013  . Fracture of olecranon process of right ulna 12/08/2013  . Hip fracture 12/04/2013  . Closed fracture of right ulna 12/04/2013  . Minor head injury without loss of consciousness 12/04/2013  . Leucocytosis 12/04/2013    Past Medical History:  Past Medical History  Diagnosis Date  . Arthritis   . Family history of anesthesia complication     Daughter is difficult to intubate   . Elbow fracture 12/04/2013    rt elbow   Past Surgical History:  Past Surgical History  Procedure Laterality Date  . Fracture surgery      right hip  . Femur surgery    . Orif elbow fracture Right 12/06/2013    Procedure: OPEN REDUCTION INTERNAL FIXATION (ORIF) ELBOW/OLECRANON FRACTURE;  Surgeon: Jolyn Nap, MD;  Location: [place];  Service: Orthopedics;  Laterality: Right;    Assessment & Plan Clinical Impression: Vanessa Pace is a 80 y.o. right-handed female with unremarkable past medical history admitted 12/04/2013 after a fall when she tripped over her cat in the house. Patient independent prior to admission living with her family. She denied loss of consciousness. Small laceration on the scalp posterior aspect. Cranial CT scan negative for any intracranial abnormalities. There was a small right parietal scalp hematoma. X-rays and imaging showed a displaced right olecranon fracture as well as nondisplaced small right greater trochanteric femur fracture. Underwent ORIF olecranon fracture 12/06/2013 per Dr. Grandville Silos and nonoperative  conservative care with right trochanteric femur fracture. Partial weightbearing right upper extremity with platform walker and weightbearing as tolerated right lower extremity. Hospital course pain management. Physical therapy evaluation completed an ongoing with recommendations for physical medicine rehabilitation consult. Patient was admitted for comprehensive rehabilitation program. Patient transferred to CIR on 12/09/2013 .   Patient currently requires min-mod A with mobility secondary to muscle weakness, decreased cardiorespiratoy endurance and decreased standing balance, decreased balance strategies and pain.  Prior to hospitalization, patient was independent  with mobility and lived with Family (lives with son and daughter) in a House home.  Home access is 3Stairs to enter.  Patient will benefit from skilled PT intervention to maximize safe functional mobility, minimize fall risk and decrease caregiver burden for planned discharge home with 24 hour supervision.  Anticipate patient will benefit from follow up [place] at discharge.  PT - End of Session Activity Tolerance: Tolerates 10 - 20 min activity with multiple rests Endurance Deficit: Yes PT Assessment Rehab Potential: Good PT Patient demonstrates impairments in the following area(s): Balance;Edema;Endurance;Motor;Pain;Safety;Skin Integrity;Other (comment) (strength) PT Transfers Functional Problem(s): Bed Mobility;Bed to Chair;Car;Furniture PT Locomotion Functional Problem(s): Ambulation;Wheelchair Mobility;Stairs PT Plan PT Intensity: Minimum of 1-2 x/day ,45 to 90 minutes PT Frequency: 5 out of 7 days PT Duration Estimated Length of Stay: 12-14days PT Treatment/Interventions: Ambulation/gait training;Disease management/prevention;Pain management;Stair training;Wheelchair propulsion/positioning;Therapeutic Activities;Patient/family education;DME/adaptive equipment instruction;Balance/vestibular training;Cognitive  remediation/compensation;Functional electrical stimulation;Psychosocial support;Therapeutic Exercise;UE/LE Strength taining/ROM;Skin care/wound management;Functional mobility training;Community reintegration;Discharge planning;Neuromuscular re-education;UE/LE Coordination activities PT Transfers Anticipated Outcome(s): Supervision-min A PT Locomotion Anticipated Outcome(s): Supervision-min A PT Recommendation Follow Up Recommendations: Home health PT Patient destination: Home Equipment Recommended: To  be determined  Skilled Therapeutic Intervention 1:1. Pt received sitting in w/c with daughter in room, ready for therapy. PT evaluation performed, see detailed objective information below. Pt and daughter educated on rehab environment, role of therapies, goals for physical therapy and general safety plan, both verbalized understanding. Tx initiated with emphasis on functional transfers and activity tolerance. Pt limited by pain leading to bout of emesis, RN immediately made aware and present to address. Pt able to complete session once medication received. Overall, pt req mod multimodal cues with increased success of tactile>verbal as pt is very HOH. Pt's daughter very anxious throughout session, education provided to address pt's mobility/pain related concerns. Pt left sitting in w/c w/ all needs in reach, quick release belt in place and daughter in room.   PT Evaluation Precautions/Restrictions Precautions Precautions: Fall Precaution Comments: HOH Required Braces or Orthoses: Sling Restrictions Weight Bearing Restrictions: Yes RUE Weight Bearing: Partial weight bearing RLE Weight Bearing: Weight bearing as tolerated General Chart Reviewed: Yes Family/Caregiver Present: Yes Vital SignsTherapy Vitals Temp: 98.4 F (36.9 C) Temp src: Oral Pulse Rate: 101 Resp: 17 BP: 109/68 mmHg Oxygen Therapy SpO2: 95 % O2 Device: None (Room air) Home Living/Prior Functioning Home Living Available  Help at Discharge: Available 24 hours/day (lives with son and daughter who work outside of the home. 2nd daughter reports she will provide daytime assist/S while they are at work.) Type of Home: House Home Access: Stairs to enter [employer] of Steps: 3 Entrance Stairs-Rails: L rail Home Layout: One level Additional Comments: bathroom accessible with cane. Patients daughter reports it is very small.   Lives With: Family (lives with son and daughter) Prior Function Level of Independence: Independent with basic ADLs;Independent with gait;Independent with transfers;Independent with homemaking with ambulation  Able to Take Stairs?: Yes Driving: Yes (But family with concerns regarding safety) Vocation: Retired Leisure: Hobbies-yes (Comment) Comments: gardening, being active Vision/Perception    See OT eval Cognition Overall Cognitive Status: Within Functional Limits for tasks assessed Arousal/Alertness: Awake/alert Orientation Level: Oriented X4 Attention: Sustained;Selective Sustained Attention: Appears intact Selective Attention: Appears intact Memory: Appears intact Awareness: Impaired Awareness Impairment: Emergent impairment Problem Solving: Impaired Problem Solving Impairment: Functional basic Safety/Judgment: Impaired Sensation Coordination Fine Motor Movements are Fluid and Coordinated: No (Impaired at baseline due to arthritis) Motor  Motor Motor: Within Functional Limits Motor - Skilled Clinical Observations: generalized weakness  Mobility Bed Mobility Bed Mobility: Supine to Sit;Sit to Supine Supine to Sit: 4: Min assist Supine to Sit Details: Verbal cues for sequencing;Verbal cues for precautions/safety Sit to Supine: 4: Min assist Sit to Supine - Details: Verbal cues for precautions/safety;Verbal cues for sequencing Sit to Supine - Details (indicate cue type and reason): Pt reports pain with transitional movements- upon sitting up, pt experienced bout of  emesis due to pain Transfers Transfers: Yes Sit to Stand: 4: Min assist Sit to Stand Details: Verbal cues for precautions/safety;Verbal cues for sequencing;Tactile cues for sequencing Stand to Sit: 4: Min assist Stand to Sit Details (indicate cue type and reason): Tactile cues for sequencing;Verbal cues for sequencing;Verbal cues for precautions/safety Stand Pivot Transfers: 4: Min assist Stand Pivot Transfer Details: Verbal cues for precautions/safety;Verbal cues for sequencing;Verbal cues for safe use of DME/AE;Tactile cues for sequencing Locomotion  Ambulation Ambulation: Yes Ambulation/Gait Assistance: 3: Mod assist;4: Min assist Ambulation Distance (Feet): 100 Feet Assistive device: Small based quad cane Ambulation/Gait Assistance Details: Verbal cues for safe use of DME/AE;Verbal cues for precautions/safety;Manual facilitation for weight shifting;Verbal cues for gait pattern Ambulation/Gait Assistance  Details: Pt initally demonstrating non-fluid 3-point gait pattern w/ dysmetric step lengths, but progressing tomore fluid  2-point gait pattern with cues. Manual facilitation/tactile cues more effective than verbal as pt is HOH. Gait Gait: Yes Gait Pattern: Impaired Gait Pattern: Step-to pattern;Step-through pattern;Decreased stride length;Decreased hip/knee flexion - right;Decreased hip/knee flexion - left;Narrow base of support;Trunk flexed Stairs / Additional Locomotion Stairs: No (Limited by time) Architect: Yes Wheelchair Assistance: 4: Advertising account executive Details: Verbal cues for Marketing executive: Both lower extermities Wheelchair Parts Management: Needs assistance Distance: 150'  Trunk/Postural Assessment  Cervical Assessment Cervical Assessment: Exceptions to [facility] (forward head) Thoracic Assessment Thoracic Assessment: Exceptions to [facility] (kyphotic) Lumbar Assessment Lumbar Assessment: Exceptions to [facility] (pt reports  LBP with transitional movements) Postural Control Postural Control: Within Functional Limits  Balance Static Sitting Balance Static Sitting - Balance Support: Feet supported;No upper extremity supported Static Sitting - Level of Assistance: 5: Stand by assistance Dynamic Sitting Balance Dynamic Sitting - Balance Support: Feet supported;Right upper extremity supported;No upper extremity supported Dynamic Sitting - Level of Assistance: 4: Min assist Dynamic Sitting - Balance Activities: Lateral lean/weight shifting;Forward lean/weight shifting Static Standing Balance Static Standing - Balance Support: Left upper extremity supported Static Standing - Level of Assistance: 4: Min assist Dynamic Standing Balance Dynamic Standing - Balance Support: Left upper extremity supported Dynamic Standing - Level of Assistance: 4: Min assist;3: Mod assist Dynamic Standing - Balance Activities: Forward lean/weight shifting;Lateral lean/weight shifting Extremity Assessment  RUE Assessment RUE Assessment: Not tested (due to cast) LUE Assessment LUE Assessment: Within Functional Limits (hx of arthritis) RLE Assessment RLE Assessment: Exceptions to [facility] RLE Strength RLE Overall Strength Comments: Grossly 3+/5 LLE Assessment LLE Assessment: Exceptions to [facility] LLE Strength LLE Overall Strength Comments: Grossly 3+/5  FIM:  FIM - Control and instrumentation engineer Devices: Walker;Arm rests Bed/Chair Transfer: 4: Supine > Sit: Min A (steadying Pt. > 75%/lift 1 leg);4: Sit > Supine: Min A (steadying pt. > 75%/lift 1 leg);4: Bed > Chair or W/C: Min A (steadying Pt. > 75%);4: Chair or W/C > Bed: Min A (steadying Pt. > 75%) FIM - Locomotion: Wheelchair Distance: 150' Locomotion: Wheelchair: 4: Travels 150 ft or more: maneuvers on rugs and over door sillls with minimal assistance (Pt.>75%) FIM - Locomotion: Ambulation Locomotion: Ambulation Assistive Devices: Nurse, adult Ambulation/Gait  Assistance: 3: Mod assist;4: Min assist Locomotion: Ambulation: 2: Travels 50 - 149 ft with moderate assistance (Pt: 50 - 74%) FIM - Locomotion: Stairs Locomotion: Stairs: 0: Activity did not occur   Refer to Care Plan for Long Term Goals  Recommendations for other services: None  Discharge Criteria: Patient will be discharged from PT if patient refuses treatment 3 consecutive times without medical reason, if treatment goals not met, if there is a change in medical status, if patient makes no progress towards goals or if patient is discharged from hospital.  The above assessment, treatment plan, treatment alternatives and goals were discussed and mutually agreed upon: by patient and by family  Gilmore Laroche 12/10/2013, 8:40 PM

## 2013-12-10 NOTE — Progress Notes (Signed)
Physical Therapy Session Note  Patient Details  Name: GLENDON FISER MRN: [id number] Date of Birth: [birth year]  Today's Date: 12/10/2013  Pt. Missed 60 minutes of physical therapy secondary to diagnosed RLE DVT and being placed on bedrest per RN.  Juluis Mire 12/10/2013, 8:24 AM

## 2013-12-10 NOTE — Progress Notes (Signed)
VASCULAR LAB PRELIMINARY  PRELIMINARY  PRELIMINARY  PRELIMINARY  Bilateral lower extremity venous duplex completed.    Preliminary report:  Right - Positive for an acute deep vein thrombus noted in the mid to proximal common femoral vein coursing to the level of the saphenofemoral junction. Rouleau flow noted in the popliteal vein. No evidence of superficial thrombosis or Baker's cyst. Left - No evidence of a deep vein or superficial vein thrombosis. No evidence of a Baker's cyst.  Duff Pozzi, RVS 12/10/2013, 1:49 PM

## 2013-12-10 NOTE — Evaluation (Addendum)
Occupational Therapy Assessment and Plan  Patient Details  Name: Vanessa Pace MRN: [id number] Date of Birth: [birth year]  OT Diagnosis: muscle weakness and pain in joint Rehab Potential: Rehab Potential: Good (for stated goals) ELOS: 12-14 days   Today's Date: 12/10/2013 Time: 1005-1105 Time Calculation (min): 60 min  Problem List:  Patient Active Problem List   Diagnosis Date Noted  . Fall 12/09/2013  . Fracture of olecranon process of right ulna 12/08/2013  . Hip fracture 12/04/2013  . Closed fracture of right ulna 12/04/2013  . Minor head injury without loss of consciousness 12/04/2013  . Leucocytosis 12/04/2013    Past Medical History:  Past Medical History  Diagnosis Date  . Arthritis   . Family history of anesthesia complication     Daughter is difficult to intubate   . Elbow fracture 12/04/2013    rt elbow   Past Surgical History:  Past Surgical History  Procedure Laterality Date  . Fracture surgery      right hip  . Femur surgery    . Orif elbow fracture Right 12/06/2013    Procedure: OPEN REDUCTION INTERNAL FIXATION (ORIF) ELBOW/OLECRANON FRACTURE;  Surgeon: Jolyn Nap, MD;  Location: [place];  Service: Orthopedics;  Laterality: Right;    Assessment & Plan Clinical Impression: Patient is a 80 y.o. female with unremarkable past medical history admitted 12/04/2013 after a fall when she tripped over her cat in the house. Patient independent prior to admission living with her family. She denied loss of consciousness. Small laceration on the scalp posterior aspect.  There was a small right parietal scalp hematoma. X-rays and imaging showed a displaced right olecranon fracture as well as nondisplaced small right greater trochanteric femur fracture. Underwent ORIF olecranon fracture 12/06/2013 per Dr. Grandville Silos and nonoperative conservative care with right trochanteric femur fracture. Partial weightbearing right upper extremity with platform walker and weightbearing as  tolerated right lower extremity.Patient transferred to CIR on 12/09/2013 .    Patient currently requires max with basic self-care skills secondary to muscle weakness, decreased cardiorespiratoy endurance, R UE limitations and decreased standing balance and decreased balance strategies.  Prior to hospitalization, patient could complete ADLs with independent .  Patient will benefit from skilled intervention to decrease level of assist with basic self-care skills prior to discharge home with care partner.  Anticipate patient will require 24 hour supervision and follow up home health.  OT - End of Session Activity Tolerance: Tolerates 10 - 20 min activity with multiple rests Endurance Deficit: Yes OT Assessment Rehab Potential: Good (for stated goals) Barriers to Discharge:  (none noted) OT Patient demonstrates impairments in the following area(s): Balance;Edema;Endurance;Motor;Safety;Other (Comment) (self care) OT Basic ADL's Functional Problem(s): Grooming;Bathing;Dressing;Toileting OT Transfers Functional Problem(s): Toilet;Tub/Shower OT Additional Impairment(s):  (partial wt. bearing on R UE, WBAT on R LE) OT Plan OT Intensity: Minimum of 1-2 x/day, 45 to 90 minutes OT Frequency: 5 out of 7 days OT Duration/Estimated Length of Stay: 12-14 days OT Treatment/Interventions: Balance/vestibular training;Discharge planning;Functional mobility training;Pain management;Psychosocial support;Therapeutic Activities;Therapeutic Exercise;Self Care/advanced ADL retraining;Patient/family education OT Basic Self-Care Anticipated Outcome(s): supervision OT Toileting Anticipated Outcome(s): supervision OT Bathroom Transfers Anticipated Outcome(s): supervision OT Recommendation Patient destination: Home Follow Up Recommendations: Home health OT;24 hour supervision/assistance Equipment Recommended: To be determined Equipment Details: has hand held shower head only   Skilled Therapeutic Intervention OT  evaluation initiated and completed. Her daughter present during evaluation. Patient and caregiver accept POC. Educated on weight bearing precautions, ADL retraining, POC, OT purpose, and goals. ADL session  performed seated EOB for bathing and dressing. Sink side for grooming tasks. Pt with increased fatigue during session and required rest breaks along with additional time to complete tasks. OT educated pt on exercises and techniques to decrease edema in R hand and digits.   OT Evaluation Precautions/Restrictions   R UE - partial weight bearing and R LE WBAT Pain  3/10 pain in mid back reported and described as dull ache. RN gave medication. Rest and repositioning after session. Home Living/Prior Functioning Home Living Family/patient expects to be discharged to:: Private residence Living Arrangements: Children Available Help at Discharge: Available 24 hours/day (lives with son and daughter who work outside of the home. 2nd daughter reports she will provide daytime assist/S while they are at work.) Type of Home: House Home Access: Stairs to enter [employer] of Steps: 3 Entrance Stairs-Rails: Left Home Layout: One level Additional Comments: bathroom accessible with cane. Patients daughter reports it is very small.   Lives With: Family (lives with son and daughter) Prior Function Level of Independence: Independent with basic ADLs;Independent with gait;Independent with transfers;Independent with homemaking with ambulation  Able to Take Stairs?: Yes Driving: Yes (But family with concerns regarding safety) Vocation: Retired Leisure: Hobbies-yes (Comment) Comments: gardening, being active Vision/Perception    WFLs - wears glasses Cognition  WFL - baseline Sensation Sensation Light Touch: Appears Intact Proprioception: Appears Intact Coordination Gross Motor Movements are Fluid and Coordinated: Yes Fine Motor Movements are Fluid and Coordinated: No (Baseline. Pt. has very B  arthritic hands. R hand and digits appear to have pitting edema secondary to swelling. ) Motor  Motor Motor: Within Functional Limits Motor - Skilled Clinical Observations: generalized weakness Mobility  Transfers Transfers: Sit to Stand Sit to Stand: 4: Min assist  Trunk/Postural Assessment  Cervical Assessment Cervical Assessment: Exceptions to [facility] (forward head) Thoracic Assessment Thoracic Assessment: Exceptions to [facility] (rounded shoulder, kyphosis) Lumbar Assessment Lumbar Assessment: Exceptions to [facility]  Balance Balance Balance Assessed: Yes Static Sitting Balance Static Sitting - Balance Support: Feet supported;No upper extremity supported Static Sitting - Level of Assistance: 5: Stand by assistance Dynamic Sitting Balance Dynamic Sitting - Balance Support: Feet supported;Right upper extremity supported;No upper extremity supported Dynamic Sitting - Level of Assistance: 4: Min assist Extremity/Trunk Assessment RUE Assessment RUE Assessment: Not tested (secondary to surgical site. Cast covering much of R UE for protection. ) LUE Assessment LUE Assessment: Within Functional Limits (arthritic hands.)  FIM:   see FIM results  Refer to Care Plan for Long Term Goals  Pt will be seen 5-7 x/wk for 60-90 minutes of skilled occupational therapy services to address current deficits. Estimated LOS - 12-14 days  Recommendations for other services: None  Discharge Criteria: Patient will be discharged from OT if patient refuses treatment 3 consecutive times without medical reason, if treatment goals not met, if there is a change in medical status, if patient makes no progress towards goals or if patient is discharged from hospital.  The above assessment, treatment plan, treatment alternatives and goals were discussed and mutually agreed upon: by patient and by family  Phineas Semen 12/10/2013, 3:16 PM

## 2013-12-10 NOTE — Progress Notes (Signed)
RUE in splint/sling. NVI, digits with good ROM  Xrays of right hip reveal no displacement of greater troch fx.  Continue WBAT RLE Continue PWB RUE with platform  F/u in my office approx. 2-3 weeks from surgery on RUE (12-06-13)  I will be away next week, but call with any pertinent questions: [facility] [phone]

## 2013-12-11 ENCOUNTER — Inpatient Hospital Stay (HOSPITAL_COMMUNITY): Payer: Medicare Other

## 2013-12-11 ENCOUNTER — Encounter (HOSPITAL_COMMUNITY): Payer: Medicare Other

## 2013-12-11 ENCOUNTER — Inpatient Hospital Stay (HOSPITAL_COMMUNITY): Payer: Medicare Other | Admitting: *Deleted

## 2013-12-11 LAB — VITAMIN D 1,25 DIHYDROXY
VITAMIN D 1, 25 (OH) TOTAL: 53 pg/mL (ref 18–72)
Vitamin D2 1, 25 (OH)2: <8 pg/mL
Vitamin D3 1, 25 (OH)2: 53 pg/mL

## 2013-12-11 LAB — PROTIME-INR
INR: 1.07 (ref 0.00–1.49)
Prothrombin Time: 13.9 seconds (ref 11.6–15.2)

## 2013-12-11 MED ORDER — WARFARIN SODIUM 2.5 MG PO TABS
2.5000 mg | ORAL_TABLET | Freq: Once | ORAL | Status: AC
  Administered 2013-12-11: 2.5 mg via ORAL
  Filled 2013-12-11: qty 1

## 2013-12-11 NOTE — Care Management Note (Signed)
Inpatient [facility] Individual Statement of Services  Patient Name:  Vanessa Pace  Date:  12/11/2013  Welcome to the [facility].  Our goal is to provide you with an individualized program based on your diagnosis and situation, designed to meet your specific needs.  With this comprehensive rehabilitation program, you will be expected to participate in at least 3 hours of rehabilitation therapies Monday-Friday, with modified therapy programming on the weekends.  Your rehabilitation program will include the following services:  Physical Therapy (PT), Occupational Therapy (OT), 24 hour per day rehabilitation nursing, Therapeutic Recreaction (TR), Neuropsychology, Case Management (Social Worker), Rehabilitation Medicine, Nutrition Services and Pharmacy Services  Weekly team conferences will be held on Tuesdays to discuss your progress.  Your Social Worker will talk with you frequently to get your input and to update you on team discussions.  Team conferences with you and your family in attendance may also be held.  Expected length of stay: 12-14 days  Overall anticipated outcome: supervision  Depending on your progress and recovery, your program may change. Your Social Worker will coordinate services and will keep you informed of any changes. Your Social Worker's name and contact numbers are listed  below.  The following services may also be recommended but are not provided by the [facility] will be made to provide these services after discharge if needed.  Arrangements include referral to agencies that provide these services.  Your insurance has been verified to be:  Medicare and [place] Your primary doctor is:  Dr. Kelton Pillar  Pertinent information will be shared with your doctor and your insurance company.  Social Worker:   [place], [place] or ([phone]   Information discussed with and copy given to patient by: Lennart Pall, 12/11/2013, 4:33 PM

## 2013-12-11 NOTE — Progress Notes (Signed)
Physical Therapy Session Note  Patient Details  Name: Vanessa Pace MRN: [id number] Date of Birth: 19-May-1926  Today's Date: 12/11/2013 Patient missing 30 min skilled physical therapy secondary to patient being on bedrest.   Juluis Mire 12/11/2013, 4:05 PM

## 2013-12-11 NOTE — Progress Notes (Addendum)
Physical Therapy Session Note  Patient Details  Name: Vanessa Pace MRN: [id number] Date of Birth: [birth year]  Today's Date: 12/11/2013 Pt. Missed 60 minutes of physical therapy due to being on bedrest.   Juluis Mire 12/11/2013, 9:45 AM

## 2013-12-11 NOTE — Progress Notes (Signed)
Occupational Therapy Note  Patient Details  Name: Vanessa Pace MRN: [id number] Date of Birth: December 27, 1926 Today's Date: 12/11/2013  Patient missing 45 min skilled occupational therapy secondary to patient being on bedrest for entire day.  Lillie Portner N 12/11/2013, 12:03 PM

## 2013-12-11 NOTE — Progress Notes (Signed)
ANTICOAGULATION CONSULT NOTE - Follow-up Consult  Pharmacy Consult for Coumadin and Lovenox Indication: RLE DVT  Allergies  Allergen Reactions  . Adhesive [Tape] Other (See Comments)    Tears skin  . Amoxicillin Rash  . Hydrocodone Nausea Only    Patient Measurements: Height: 5' (152.4 cm) Weight: 87 lb 1.3 oz (39.5 kg) IBW/kg (Calculated) : 45.5  Vital Signs: Temp: 98 F (36.7 C) (08/01 0504) Temp src: Oral (08/01 0504) BP: 104/63 mmHg (08/01 0504) Pulse Rate: 93 (08/01 0504)  Labs:  Recent Labs  12/10/13 0725 12/11/13 0950  HGB 11.7*  --   HCT 35.8*  --   PLT 292  --   LABPROT  --  13.9  INR  --  1.07  CREATININE 0.63  --     Estimated Creatinine Clearance: 32.6 ml/min (by C-G formula based on Cr of 0.63).  Assessment: 80 y.o. female admitted 7/25 s/p fall. Pt underwent ORIF on elbow 7/27. Transferred to rehab 7/30. Found to have RLE DVT on venous duplex 7/31. Started coumadin with lovenox bridge yesterday (today is Day #2 of minimum 5-day overlap). No baseline INR drawn. INR today is 1.07 past one dose of coumadin. CBC stable 7/31. No bleeding noted.  Goal of Therapy:  Anti-Xa level 0.6-1 units/ml 4hrs after LMWH dose given; INR 2-3 Monitor platelets by anticoagulation protocol: Yes   Plan:  1. Lovenox 40mg  SQ q12h. 2. CBC q72h while on Lovenox 3. Daily INR 4. Coumadin 2.5mg  again today  Sherlon Handing, PharmD, BCPS Clinical pharmacist, pager [phone] 12/11/2013,12:20 PM

## 2013-12-11 NOTE — Progress Notes (Signed)
[name] PHYSICAL MEDICINE & REHABILITATION     PROGRESS NOTE    Subjective/Complaints: No problems overnight. Disappointed that she's unable to get out of bed.  A  review of systems has been performed and if not noted above is otherwise negative.   Objective: Vital Signs: Blood pressure 104/63, pulse 93, temperature 98 F (36.7 C), temperature source Oral, resp. rate 16, height 5' (1.524 m), weight 39.5 kg (87 lb 1.3 oz), SpO2 93.00%. Dg Hip Complete Right  12/10/2013   CLINICAL DATA:  Right hip pain. Follow-up greater trochanteric fracture.  EXAM: RIGHT HIP - COMPLETE 2+ VIEW  COMPARISON:  Radiographs and CT 12/04/2013.  FINDINGS: The bones are demineralized. There is no hardware displacement status post screw fixation of a subcapital fracture. The posttraumatic deformity of the femoral neck is stable. The nondisplaced fracture of the greater trochanter demonstrated on CT is not well visualized, but appears stable. No new fractures are identified.  IMPRESSION: Stable nondisplaced fracture of the right femoral greater trochanter.   Electronically Signed   By: Camie Patience M.D.   On: 12/10/2013 08:13    Recent Labs  12/10/13 0725  WBC 9.3  HGB 11.7*  HCT 35.8*  PLT 292    Recent Labs  12/10/13 0725  NA 136*  K 4.4  CL 96  GLUCOSE 96  BUN 13  CREATININE 0.63  CALCIUM 9.3    CBG (last 3)  No results found for this basename: GLUCAP,  in the last 72 hours  Wt Readings from Last 3 Encounters:  12/09/13 39.5 kg (87 lb 1.3 oz)  12/04/13 40.37 kg (89 lb)  12/04/13 40.37 kg (89 lb)    Physical Exam:  Constitutional: She is oriented to person, place, and time. She appears well-developed.  HENT: oral mucosa moist Head: Normocephalic.  Eyes: EOM are normal.  Neck: Normal range of motion. Neck supple. No thyromegaly present.  Cardiovascular: Normal rate and regular rhythm. murmur Respiratory: Effort normal and breath sounds normal. No respiratory distress. No wheezes GI:  Soft. Bowel sounds are normal. She exhibits no distension. No pain Neurological: She is alert and oriented to person, place, and time.  Follows full commands. hoh. Skin: Skin is warm and dry. Scattered ecchymoses and bruises Right upper extremity with soft cast and shoulder sling. Appropriately tender  left upper extremity 5/5 in deltoid, bicep, tricep, grip  Right grip is 4 minus she has pain with range of motion of her right fingers  Extremities show osteoarthritic changes bilateral fingers and toes  Right lower extremity 4 minus hip flexion knee extensor ankle dorsi flexion plantarflexion  Left lower extremity 5/5 hip flexion knee extension ankle dorsiflexion plantar flexion  Psych: pleasant and cooperative   Assessment/Plan: 1. Functional deficits secondary to right olecranon/greater troch fx's which require 3+ hours per day of interdisciplinary therapy in a comprehensive inpatient rehab setting. Physiatrist is providing close team supervision and 24 hour management of active medical problems listed below. Physiatrist and rehab team continue to assess barriers to discharge/monitor patient progress toward functional and medical goals.  FIM: FIM - Bathing Bathing Steps Patient Completed: Chest;Right Arm;Left Arm;Abdomen;Left upper leg;Right upper leg;Buttocks;Front perineal area Bathing: 4: Min-Patient completes 8-9 28f 10 parts or 75+ percent  FIM - Upper Body Dressing/Undressing Upper body dressing/undressing steps patient completed: Thread/unthread left bra strap;Thread/unthread left sleeve of pullover shirt/dress;Pull shirt over trunk;Put head through opening of pull over shirt/dress Upper body dressing/undressing: 3: Mod-Patient completed 50-74% of tasks FIM - Lower Body Dressing/Undressing Lower body  dressing/undressing steps patient completed: Thread/unthread left underwear leg;Pull underwear up/down;Thread/unthread left pants leg;Pull pants up/down Lower body dressing/undressing:  2: Max-Patient completed 25-49% of tasks        FIM - Control and instrumentation engineer Devices: Walker;Arm rests Bed/Chair Transfer: 4: Supine > Sit: Min A (steadying Pt. > 75%/lift 1 leg);4: Sit > Supine: Min A (steadying pt. > 75%/lift 1 leg);4: Bed > Chair or W/C: Min A (steadying Pt. > 75%);4: Chair or W/C > Bed: Min A (steadying Pt. > 75%)  FIM - Locomotion: Wheelchair Distance: 150' Locomotion: Wheelchair: 4: Travels 150 ft or more: maneuvers on rugs and over door sillls with minimal assistance (Pt.>75%) FIM - Locomotion: Ambulation Locomotion: Ambulation Assistive Devices: Nurse, adult Ambulation/Gait Assistance: 3: Mod assist;4: Min assist Locomotion: Ambulation: 2: Travels 50 - 149 ft with moderate assistance (Pt: 50 - 74%)  Comprehension Comprehension Mode: Auditory Comprehension: 5-Understands basic 90% of the time/requires cueing < 10% of the time  Expression Expression Mode: Verbal Expression: 5-Expresses basic 90% of the time/requires cueing < 10% of the time.  Social Interaction Social Interaction: 6-Interacts appropriately with others with medication or extra time (anti-anxiety, antidepressant).  Problem Solving Problem Solving: 5-Solves basic 90% of the time/requires cueing < 10% of the time  Memory Memory: 5-Recognizes or recalls 90% of the time/requires cueing < 10% of the time  Medical Problem List and Plan:  1. Functional deficits secondary to fall sustaining a displaced right olecranon fracture and nondisplaced small right greater trochanteric femur fracture. Status post ORIF olecranon fracture. Nonweightbearing right upper extremity with platform walker weightbearing as tolerated right lower extremity  2. DVT: CFV dvt. Bedrest today. Resume activity tomorrow  -coumadin/lovenox intiated after discussion with family   -spoke with pt/family as well today 3. Pain Management: Oxycodone as needed. Monitor with increased mobility  4.  Constipation. Adjusted bowel program. No bm yet 5. Neuropsych: This patient is capable of making decisions on her own behalf.  6. Skin/Wound Care: Routine skin checks ongoing  LOS (Days) 2 A FACE TO FACE EVALUATION WAS PERFORMED  [name] T 12/11/2013 9:58 AM

## 2013-12-11 NOTE — Discharge Instructions (Addendum)
Information on my medicine - Coumadin   (Warfarin)  This medication education was reviewed with me or my healthcare representative as part of my discharge preparation.  The pharmacist that spoke with me during my hospital stay was:  Sherlon Handing, PharmD  Why was Coumadin prescribed for you? Coumadin was prescribed for you because you have a blood clot or a medical condition that can cause an increased risk of forming blood clots. Blood clots can cause serious health problems by blocking the flow of blood to the heart, lung, or brain. Coumadin can prevent harmful blood clots from forming. As a reminder your indication for Coumadin is:   Deep Vein Thrombosis Treatment  What test will check on my response to Coumadin? While on Coumadin (warfarin) you will need to have an INR test regularly to ensure that your dose is keeping you in the desired range. The INR (international normalized ratio) number is calculated from the result of the laboratory test called prothrombin time (PT).  If an INR APPOINTMENT HAS NOT ALREADY BEEN MADE FOR YOU please schedule an appointment to have this lab work done by your health care provider within 7 days. Your INR goal is usually a number between:  2 to 3 or your provider may give you a more narrow range like 2-2.5.  Ask your health care provider during an office visit what your goal INR is.  What  do you need to  know  About  COUMADIN? Take Coumadin (warfarin) exactly as prescribed by your healthcare provider about the same time each day.  DO NOT stop taking without talking to the doctor who prescribed the medication.  Stopping without other blood clot prevention medication to take the place of Coumadin may increase your risk of developing a new clot or stroke.  Get refills before you run out.  What do you do if you miss a dose? If you miss a dose, take it as soon as you remember on the same day then continue your regularly scheduled regimen the next day.  Do not take  two doses of Coumadin at the same time.  Important Safety Information A possible side effect of Coumadin (Warfarin) is an increased risk of bleeding. You should call your healthcare provider right away if you experience any of the following:   Bleeding from an injury or your nose that does not stop.   Unusual colored urine (red or dark brown) or unusual colored stools (red or black).   Unusual bruising for unknown reasons.   A serious fall or if you hit your head (even if there is no bleeding).  Some foods or medicines interact with Coumadin (warfarin) and might alter your response to warfarin. To help avoid this:   Eat a balanced diet, maintaining a consistent amount of Vitamin K.   Notify your provider about major diet changes you plan to make.   Avoid alcohol or limit your intake to 1 drink for women and 2 drinks for men per day. (1 drink is 5 oz. wine, 12 oz. beer, or 1.5 oz. liquor.)  Make sure that ANY health care provider who prescribes medication for you knows that you are taking Coumadin (warfarin).  Also make sure the healthcare provider who is monitoring your Coumadin knows when you have started a new medication including herbals and non-prescription products.  Coumadin (Warfarin)  Major Drug Interactions  Increased Warfarin Effect Decreased Warfarin Effect  Alcohol (large quantities) Antibiotics (esp. Septra/Bactrim, Flagyl, Cipro) Amiodarone (Cordarone) Aspirin (ASA) Cimetidine (Tagamet)  Megestrol (Megace) NSAIDs (ibuprofen, naproxen, etc.) Piroxicam (Feldene) Propafenone (Rythmol SR) Propranolol (Inderal) Isoniazid (INH) Posaconazole (Noxafil) Barbiturates (Phenobarbital) Carbamazepine (Tegretol) Chlordiazepoxide (Librium) Cholestyramine (Questran) Griseofulvin Oral Contraceptives Rifampin Sucralfate (Carafate) Vitamin K   Coumadin (Warfarin) Major Herbal Interactions  Increased Warfarin Effect Decreased Warfarin Effect  Garlic Ginseng Ginkgo biloba  Coenzyme Q10 Green tea St. Johns wort    Coumadin (Warfarin) FOOD Interactions  Eat a consistent number of servings per week of foods HIGH in Vitamin K (1 serving =  cup)  Collards (cooked, or boiled & drained) Kale (cooked, or boiled & drained) Mustard greens (cooked, or boiled & drained) Parsley *serving size only =  cup Spinach (cooked, or boiled & drained) Swiss chard (cooked, or boiled & drained) Turnip greens (cooked, or boiled & drained)  Eat a consistent number of servings per week of foods MEDIUM-HIGH in Vitamin K (1 serving = 1 cup)  Asparagus (cooked, or boiled & drained) Broccoli (cooked, boiled & drained, or raw & chopped) Brussel sprouts (cooked, or boiled & drained) *serving size only =  cup Lettuce, raw (green leaf, endive, romaine) Spinach, raw Turnip greens, raw & chopped   These websites have more information on Coumadin (warfarin):  FailFactory.se; VeganReport.com.au;  Inpatient Rehab Discharge Instructions  Vanessa Pace Discharge date and time: No discharge date for patient encounter.   Activities/Precautions/ Functional Status: Activity: Partial weight-bearing right upper extremity with platform rolling walker. Weightbearing as tolerated right lower extremity Diet: regular diet Wound Care: none needed Functional status:  ___ No restrictions     ___ Walk up steps independently ___ 24/7 supervision/assistance   ___ Walk up steps with assistance ___ Intermittent supervision/assistance  ___ Bathe/dress independently ___ Walk with walker     ___ Bathe/dress with assistance ___ Walk Independently    ___ Shower independently _x__ Walk with assistance    ___ Shower with assistance ___ No alcohol     ___ Return to work/school ________   COMMUNITY REFERRALS UPON DISCHARGE:    Home Health:   PT      RN                     Agency: [facility] Phone: [phone]   Medical Equipment/Items Ordered: transport wheelchair, cane and  tub transfer bench                                                     Agency/Supplier: [place] [facility] @ [phone]    Special Instructions: Home health nurse to check INR on Friday, 12/24/2013 results To Dr. Kelton Pillar [phone] fax number [phone]   My questions have been answered and I understand these instructions. I will adhere to these goals and the provided educational materials after my discharge from the hospital.  Patient/Caregiver Signature _______________________________ Date __________  Clinician Signature _______________________________________ Date __________  Please bring this form and your medication list with you to all your follow-up doctor's appointments.

## 2013-12-11 NOTE — Progress Notes (Signed)
Occupational Therapy Session Note  Patient Details  Name: Vanessa Pace MRN: [id number] Date of Birth: 04-04-1927  Today's Date: 12/11/2013   -   Skilled Therapeutic Interventions/Progress Updates:    Patient missing 45 min skilled occupational therapy secondary to patient being on bedrest for entire day.   Therapy Documentation Precautions:  Precautions Precautions: Fall Precaution Comments: HOH Required Braces or Orthoses: Sling Restrictions Weight Bearing Restrictions: Yes RUE Weight Bearing: Partial weight bearing RLE Weight Bearing: Weight bearing as tolerated General: General Amount of Missed OT Time (min): 45 Minutes   :         Therapy/Group: Individual Therapy  Lisa Roca 12/11/2013, 3:56 PM

## 2013-12-11 NOTE — Progress Notes (Signed)
Social Work  Social Work Assessment and Plan  Patient Details  Name: Vanessa Pace MRN: [id number] Date of Birth: [birth year]  Today's Date: 12/11/2013  Problem List:  Patient Active Problem List   Diagnosis Date Noted  . Fall 12/09/2013  . Fracture of olecranon process of right ulna 12/08/2013  . Hip fracture 12/04/2013  . Closed fracture of right ulna 12/04/2013  . Minor head injury without loss of consciousness 12/04/2013  . Leucocytosis 12/04/2013   Past Medical History:  Past Medical History  Diagnosis Date  . Arthritis   . Family history of anesthesia complication     Daughter is difficult to intubate   . Elbow fracture 12/04/2013    rt elbow   Past Surgical History:  Past Surgical History  Procedure Laterality Date  . Fracture surgery      right hip  . Femur surgery    . Orif elbow fracture Right 12/06/2013    Procedure: OPEN REDUCTION INTERNAL FIXATION (ORIF) ELBOW/OLECRANON FRACTURE;  Surgeon: Jolyn Nap, MD;  Location: [facility];  Service: Orthopedics;  Laterality: Right;   Social History:  reports that she has never smoked. She has never used smokeless tobacco. She reports that she drinks alcohol. She reports that she does not use illicit drugs.  Family / Support Systems Marital Status: Divorced How Long?: "many years ago" - estimates 30+ Patient Roles: Parent Children: daughter, Braulio Bosch @ (C) [phone] or [phone]) [phone];  son, Maleya Leever @ (C) [phone];  daughter, Fransisco Hertz @ [phone] or [phone]; daughter, Orvan Seen.  Arbie Cookey and [place] live WITH patient in [place] and daughter, [name], lives in [place].   Anticipated Caregiver: Melvern Sample and [name] to share Ability/Limitations of Caregiver: Son works in Architect, [employer] jobs.  Dtr, Arbie Cookey, works 8 am to about 6 pm.  Dtr [name] currently not working and lives in [place] and can assist as needed.  Daughter, Ivin Booty, is not able to assist per pt.) Caregiver Availability: 24/7 Family  Dynamics: Per report of pt and daughter, [name], it appears that all the children try to provide any support they are able and are very close with their mother.  [name] offers her personal opinions about her siblings "and their problems" as well as speaking of her own depression and psychosocial issues.  [name] does not report any significant conflict among them and notes all agreed to share assist for pt.  Social History Preferred language: English Religion: Christian Cultural Background: NA Education: college Read: Yes Write: Yes Employment Status: Retired Date Retired/Disabled/Unemployed: "years agoSecretary/administrator Issues: None Guardian/Conservator: None - per MD, pt capable of making decisions on her own behalf   Abuse/Neglect Physical Abuse: Denies Verbal Abuse: Denies Sexual Abuse: Denies Exploitation of patient/patient's resources: Denies Self-Neglect: Denies  Emotional Status Pt's affect, behavior adn adjustment status: Pt very pleasant, polite and soft-spoken.  She and daughter speak easily with me and even share more personal information on initial interview than I usually get.  Daughter seemed to stay focused on many family issues that happened many years ago prior to her parent's divorce, after their divorce and then in her own life.  Occasionally sharing personal stories of her siblings lives as welll.  Allowed her to share stories, howeve, also attempted to guide her to more pertinent, current information that I needed.  Pt appeared very happy to join daughter in these family stories of long ago and I felt that they are still , very much, holding on  to very old "hurts" in their lives.  IN regards to current situation, pt is hopeful she will make good gains with CIR, however, admits she is "a little scared" she want fully regain her independence.  Denies any s/s of depression or anxiety, however, will definitely monitor throughout her stay.  Will also monitor how family  education and training progresses with children. Recent Psychosocial Issues: Only truly recent psychosocial issue/ stressor that pt or daughter reported was the dealth of other daughter's husband last year. Pyschiatric History: Pt denies any h/o depression or anxiety.  Reports she "just worked through it (divorce)".  Daughter quickly reports that she has suffered from depression "for years". Substance Abuse History: None  Patient / Family Perceptions, Expectations & Goals Pt/Family understanding of illness & functional limitations: Pt and daughter with good, basic understanding of pt's injuries suffered in fall and of weightbearing restrictions/ need for CIR. Premorbid pt/family roles/activities: pt was completed independent PTA and working in and outside the home (outside when she fell).  Family provided any needed transportation. Anticipated changes in roles/activities/participation: Family will likely need to provide some increased, hands - on care intially and dependent on gains made here on CIR. Pt/family expectations/goals: "I want to be able to get around and do for myself."  [employer]: None Premorbid Home Care/DME Agencies: None Transportation available at discharge: yes  Discharge Planning Living Arrangements: Children Support Systems: Children Type of Residence: Private residence Insurance Resources: Education officer, museum (specify) Web designer) Financial Resources: [place] Referred: No Living Expenses: Own Money Management: Family Does the patient have any problems obtaining your medications?: No Home Management: shared among all family in the home. Patient/Family Preliminary Plans: Pt plans to return to her home with three of the children sharing in provision of 24/7 assistance. Social Work Anticipated Follow Up Needs: HH/OP Expected length of stay: 12-14 days  Clinical Impression Very pleasant, soft spoken, elderly woman  here after a fall at home and now with multiple fxs. Family appears supportive and willing/ able to share in providing 24/7 assistance at d/c.  Will monitor family education and assist with support and d/c planning needs.  Neilah Fulwider 12/11/2013, 4:31 PM

## 2013-12-12 ENCOUNTER — Inpatient Hospital Stay (HOSPITAL_COMMUNITY): Payer: Medicare Other | Admitting: *Deleted

## 2013-12-12 ENCOUNTER — Inpatient Hospital Stay (HOSPITAL_COMMUNITY): Payer: Medicare Other

## 2013-12-12 LAB — CBC
HCT: 33.3 % — ABNORMAL LOW (ref 36.0–46.0)
HEMOGLOBIN: 10.9 g/dL — AB (ref 12.0–15.0)
MCH: 29.7 pg (ref 26.0–34.0)
MCHC: 32.7 g/dL (ref 30.0–36.0)
MCV: 90.7 fL (ref 78.0–100.0)
Platelets: 300 10*3/uL (ref 150–400)
RBC: 3.67 MIL/uL — ABNORMAL LOW (ref 3.87–5.11)
RDW: 12.8 % (ref 11.5–15.5)
WBC: 8.7 10*3/uL (ref 4.0–10.5)

## 2013-12-12 LAB — PROTIME-INR
INR: 1.17 (ref 0.00–1.49)
Prothrombin Time: 14.9 seconds (ref 11.6–15.2)

## 2013-12-12 MED ORDER — WARFARIN SODIUM 3 MG PO TABS
3.0000 mg | ORAL_TABLET | Freq: Once | ORAL | Status: AC
  Administered 2013-12-12: 3 mg via ORAL
  Filled 2013-12-12: qty 1

## 2013-12-12 NOTE — Progress Notes (Signed)
Physical Therapy Note  Patient Details  Name: Vanessa Pace MRN: [id number] Date of Birth: Sep 09, 1926 Today's Date: 12/12/2013  2:15 - 3:05 50 minutes Individual session Patient reports pain as 3 or 4.  Patient in bathroom upon entering room. Patient assisted with dressing due to time. Patient ambulated with quad cane 50 feet x 2 with min assist for balance and occasional cueing for safety and posture. Patient ambulated up and down 3 steps with 1 rail and min assist and cueing for sequencing. Patient transferred wheelchair to bed with steady assist and positioned in bed. Patient left in bed with all items in reach and daughter in room.    Elder Love M 12/12/2013, 3:26 PM

## 2013-12-12 NOTE — Progress Notes (Signed)
ANTICOAGULATION CONSULT NOTE - Follow-up Consult  Pharmacy Consult for Coumadin and Lovenox Indication: RLE DVT  Allergies  Allergen Reactions  . Adhesive [Tape] Other (See Comments)    Tears skin  . Amoxicillin Rash  . Hydrocodone Nausea Only    Patient Measurements: Height: 5' (152.4 cm) Weight: 87 lb 1.3 oz (39.5 kg) IBW/kg (Calculated) : 45.5  Vital Signs: Temp: 98.5 F (36.9 C) (08/02 0641) Temp src: Oral (08/02 0641) BP: 115/73 mmHg (08/02 0641) Pulse Rate: 95 (08/02 0641)  Labs:  Recent Labs  12/10/13 0725 12/11/13 0950 12/12/13 0505  HGB 11.7*  --  10.9*  HCT 35.8*  --  33.3*  PLT 292  --  300  LABPROT  --  13.9 14.9  INR  --  1.07 1.17  CREATININE 0.63  --   --     Estimated Creatinine Clearance: 32.6 ml/min (by C-G formula based on Cr of 0.63).  Assessment: 80 y.o. female admitted 7/25 s/p fall. Pt underwent ORIF on elbow 7/27. Transferred to rehab 7/30. Found to have RLE DVT on venous duplex 7/31. Started coumadin with lovenox bridge yesterday (today is Day #3 of minimum 5-day overlap). No baseline INR drawn. INR today is 1.17 - minimal movement past 2 doses of coumadin. CBC stable 7/31. No bleeding noted.  Goal of Therapy:  Anti-Xa level 0.6-1 units/ml 4hrs after LMWH dose given; INR 2-3 Monitor platelets by anticoagulation protocol: Yes   Plan:  1. Lovenox 40mg  SQ q12h. 2. CBC q72h while on Lovenox 3. Daily INR 4. Coumadin 3mg  today  Sherlon Handing, PharmD, BCPS Clinical pharmacist, pager [phone] 12/12/2013,9:08 AM

## 2013-12-12 NOTE — IPOC Note (Signed)
Overall Plan of Care [facility]) Patient Details Name: Vanessa Pace MRN: [id number] DOB: Aug 29, 1926  Admitting Diagnosis: R olecranon  troch fx  Hospital Problems: Active Problems:   Fall     Functional Problem List: Nursing Bladder;Bowel;Medication Management;Nutrition;Pain;Safety;Skin Integrity  PT Balance;Edema;Endurance;Motor;Pain;Safety;Skin Integrity;Other (comment) (strength)  OT Balance;Edema;Endurance;Motor;Safety;Other (Comment) (self care)  SLP    TR         Basic ADL's: OT Grooming;Bathing;Dressing;Toileting     Advanced  ADL's: OT       Transfers: PT Bed Mobility;Bed to Chair;Car;Furniture  OT Toilet;Tub/Shower     Locomotion: PT Ambulation;Wheelchair Mobility;Stairs     Additional Impairments: OT  (partial wt. bearing on R UE, WBAT on R LE)  SLP        TR      Anticipated Outcomes Item Anticipated Outcome  Self Feeding    Swallowing      Basic self-care  supervision  Toileting  supervision   Bathroom Transfers supervision  Bowel/Bladder   cont of bowel and bladder  Transfers  Supervision-min A  Locomotion  Supervision-min A  Communication     Cognition     Pain  less than 2  Safety/Judgment  adhere to safety protocol   Therapy Plan: PT Intensity: Minimum of 1-2 x/day ,45 to 90 minutes PT Frequency: 5 out of 7 days PT Duration Estimated Length of Stay: 12-14days OT Intensity: Minimum of 1-2 x/day, 45 to 90 minutes OT Frequency: 5 out of 7 days OT Duration/Estimated Length of Stay: 12-14 days         Team Interventions: Nursing Interventions Pain Management;Disease Management/Prevention;Bowel Management;Bladder Management;Skin Care/Wound Management;Medication Management;Patient/Family Education;Psychosocial Support  PT interventions Ambulation/gait training;Disease management/prevention;Pain management;Stair training;Wheelchair propulsion/positioning;Therapeutic Activities;Patient/family education;DME/adaptive equipment  instruction;Balance/vestibular training;Cognitive remediation/compensation;Functional electrical stimulation;Psychosocial support;Therapeutic Exercise;UE/LE Strength taining/ROM;Skin care/wound management;Functional mobility training;Community reintegration;Discharge planning;Neuromuscular re-education;UE/LE Coordination activities  OT Interventions Balance/vestibular training;Discharge planning;Functional mobility training;Pain management;Psychosocial support;Therapeutic Activities;Therapeutic Exercise;Self Care/advanced ADL retraining;Patient/family education  SLP Interventions    TR Interventions    SW/CM Interventions Discharge Planning;Psychosocial Support;Patient/Family Education    Team Discharge Planning: Destination: PT-Home ,OT- Home , SLP-  Projected Follow-up: PT-Home health PT, OT-  Home health OT;24 hour supervision/assistance, SLP-  Projected Equipment Needs: PT-To be determined, OT- To be determined, SLP-  Equipment Details: PT- , OT-has hand held shower head only Patient/family involved in discharge planning: PT- Patient;Family member/caregiver,  OT-Patient;Family member/caregiver, SLP-   MD ELOS: 12-14 days Medical Rehab Prognosis:  Good Assessment: The patient has been admitted for CIR therapies with the diagnosis of right olecranon/hip fx's. The team will be addressing functional mobility, strength, stamina, balance, safety, adaptive techniques and equipment, self-care, bowel and bladder mgt, patient and caregiver education, pain mgt, ortho precautions and adaptations, egosupport, community reintegration. Goals have been set at supervision to min assist with mobility and self- care. Progress impeded by RLE DVT.    Meredith Staggers, MD, FAAPMR      See Team Conference Notes for weekly updates to the plan of care

## 2013-12-12 NOTE — Progress Notes (Signed)
Occupational Therapy Session Note  Patient Details  Name: Vanessa Pace MRN: [id number] Date of Birth: 10-07-1926  Today's Date: 12/12/2013 Time: 1530-1600 Time Calculation (min): 30 min  Short Term Goals: Week 1:  OT Short Term Goal 1 (Week 1): Pt. will donn and doff shoulder sling independently with increased time as needed in order to increase I with self care. OT Short Term Goal 2 (Week 1): Pt. will perform bathing, at sink side or EOB, with steady assist only in order to increase I with self care. OT Short Term Goal 3 (Week 1): Pt. will perform UB dressing with Min A in order to increased I with dressing. OT Short Term Goal 4 (Week 1): Pt. will perform LB dressing with min A in order to increase I in dressing skills.  Skilled Therapeutic Interventions/Progress Updates:    Pt. Lying in bed.  Agreed for therapy intervention.  Addressed bed mobility, transfers, edema management.  Pt's daughter present.  Pt. Went from supine to  Sit with mod assist.  Daughter trying to intervene.  Reminded her to allow pt increased time to perform.  Pt. Ambulated with quad cane to bathroom with min assist and cues for upright posture.  Pt. Stood to doff pants with minimal assist.  Unable to walk out due to fatigue.   Went over RUE edema management and sling donning and doffing.  Needs reinforcement.  Pt transferred back to bed and placed in supine with needs in reach.     Therapy Documentation Precautions:  Precautions Precautions: Fall Precaution Comments: HOH Required Braces or Orthoses: Sling Restrictions Weight Bearing Restrictions: Yes RUE Weight Bearing: Partial weight bearing RLE Weight Bearing: Weight bearing as tolerated     Pain: Pain Assessment Pain Assessment: 0-10 Pain Score: 4  Pain Type: Acute pain Pain Location: Elbow Pain Orientation: Right :         See FIM for current functional status  Therapy/Group: Individual Therapy  Lisa Roca 12/12/2013, 5:58 PM

## 2013-12-12 NOTE — Progress Notes (Signed)
[place] PHYSICAL MEDICINE & REHABILITATION     PROGRESS NOTE    Subjective/Complaints: Slept last night. Happy to be getting OOB today. Denies cough, sob, chest pain A  review of systems has been performed and if not noted above is otherwise negative.   Objective: Vital Signs: Blood pressure 115/73, pulse 95, temperature 98.5 F (36.9 C), temperature source Oral, resp. rate 18, height 5' (1.524 m), weight 39.5 kg (87 lb 1.3 oz), SpO2 93.00%. No results found.  Recent Labs  12/10/13 0725 12/12/13 0505  WBC 9.3 8.7  HGB 11.7* 10.9*  HCT 35.8* 33.3*  PLT 292 300    Recent Labs  12/10/13 0725  NA 136*  K 4.4  CL 96  GLUCOSE 96  BUN 13  CREATININE 0.63  CALCIUM 9.3    CBG (last 3)  No results found for this basename: GLUCAP,  in the last 72 hours  Wt Readings from Last 3 Encounters:  12/09/13 39.5 kg (87 lb 1.3 oz)  12/04/13 40.37 kg (89 lb)  12/04/13 40.37 kg (89 lb)    Physical Exam:  Constitutional: She is oriented to person, place, and time. She appears well-developed.  HENT: oral mucosa moist Head: Normocephalic.  Eyes: EOM are normal.  Neck: Normal range of motion. Neck supple. No thyromegaly present.  Cardiovascular: Normal rate and regular rhythm. murmur Respiratory: Effort normal and breath sounds normal. No respiratory distress. No wheezes GI: Soft. Bowel sounds are normal. She exhibits no distension. No pain Neurological: She is alert and oriented to person, place, and time.  Follows full commands. hoh. Skin: Skin is warm and dry. Scattered ecchymoses and bruises Right upper extremity with soft cast and shoulder sling. Appropriately tender  left upper extremity 5/5 in deltoid, bicep, tricep, grip  Right grip is 4 minus she has pain with range of motion of her right fingers  Extremities show osteoarthritic changes bilateral fingers and toes  Right lower extremity 4 minus hip flexion knee extensor ankle dorsi flexion plantarflexion  Left lower  extremity 5/5 hip flexion knee extension ankle dorsiflexion plantar flexion  Psych: pleasant and cooperative   Assessment/Plan: 1. Functional deficits secondary to right olecranon/greater troch fx's which require 3+ hours per day of interdisciplinary therapy in a comprehensive inpatient rehab setting. Physiatrist is providing close team supervision and 24 hour management of active medical problems listed below. Physiatrist and rehab team continue to assess barriers to discharge/monitor patient progress toward functional and medical goals.  FIM: FIM - Bathing Bathing Steps Patient Completed: Chest;Right Arm;Left Arm;Abdomen;Left upper leg;Right upper leg;Buttocks;Front perineal area Bathing: 4: Min-Patient completes 8-9 32f 10 parts or 75+ percent  FIM - Upper Body Dressing/Undressing Upper body dressing/undressing steps patient completed: Thread/unthread left bra strap;Thread/unthread left sleeve of pullover shirt/dress;Pull shirt over trunk;Put head through opening of pull over shirt/dress Upper body dressing/undressing: 3: Mod-Patient completed 50-74% of tasks FIM - Lower Body Dressing/Undressing Lower body dressing/undressing steps patient completed: Thread/unthread left underwear leg;Pull underwear up/down;Thread/unthread left pants leg;Pull pants up/down Lower body dressing/undressing: 2: Max-Patient completed 25-49% of tasks  FIM - Toileting Toileting: 0: Activity did not occur (pt on bedrest today)  FIM - Toilet Transfers Toilet Transfers:  (pt on bedrest today)  FIM - Control and instrumentation engineer Devices: Environmental consultant;Arm rests Bed/Chair Transfer: 0: Activity did not occur (pt on bedrest today)  FIM - Locomotion: Wheelchair Distance: 150' Locomotion: Wheelchair: 4: Travels 150 ft or more: maneuvers on rugs and over door sillls with minimal assistance (Pt.>75%) FIM - Locomotion: Ambulation  Locomotion: Ambulation Assistive Devices: Nurse, adult Ambulation/Gait  Assistance: 3: Mod assist;4: Min assist Locomotion: Ambulation: 2: Travels 50 - 149 ft with moderate assistance (Pt: 50 - 74%)  Comprehension Comprehension Mode: Auditory Comprehension: 5-Follows basic conversation/direction: With extra time/assistive device  Expression Expression Mode: Verbal Expression: 5-Expresses basic 90% of the time/requires cueing < 10% of the time.  Social Interaction Social Interaction: 6-Interacts appropriately with others with medication or extra time (anti-anxiety, antidepressant).  Problem Solving Problem Solving: 5-Solves basic 90% of the time/requires cueing < 10% of the time  Memory Memory: 5-Recognizes or recalls 90% of the time/requires cueing < 10% of the time  Medical Problem List and Plan:  1. Functional deficits secondary to fall sustaining a displaced right olecranon fracture and nondisplaced small right greater trochanteric femur fracture. Status post ORIF olecranon fracture. Nonweightbearing right upper extremity with platform walker weightbearing as tolerated right lower extremity  2. DVT: CFV dvt.    -coumadin/lovenox intiated after discussion with family   -resume activity today 3. Pain Management: Oxycodone as needed. Monitor with increased mobility  4. Constipation. Adjusted bowel program. No bm yet 5. Neuropsych: This patient is capable of making decisions on her own behalf.  6. Skin/Wound Care: Routine skin checks ongoing  LOS (Days) 3 A FACE TO FACE EVALUATION WAS PERFORMED  SWARTZ,ZACHARY T 12/12/2013 8:53 AM

## 2013-12-13 ENCOUNTER — Inpatient Hospital Stay (HOSPITAL_COMMUNITY): Payer: Medicare Other

## 2013-12-13 ENCOUNTER — Encounter (HOSPITAL_COMMUNITY): Payer: Medicare Other | Admitting: Occupational Therapy

## 2013-12-13 DIAGNOSIS — Z5189 Encounter for other specified aftercare: Secondary | ICD-10-CM

## 2013-12-13 DIAGNOSIS — S72009A Fracture of unspecified part of neck of unspecified femur, initial encounter for closed fracture: Secondary | ICD-10-CM

## 2013-12-13 LAB — CBC
HCT: 35.2 % — ABNORMAL LOW (ref 36.0–46.0)
HEMOGLOBIN: 11.6 g/dL — AB (ref 12.0–15.0)
MCH: 30.4 pg (ref 26.0–34.0)
MCHC: 33 g/dL (ref 30.0–36.0)
MCV: 92.4 fL (ref 78.0–100.0)
PLATELETS: 314 10*3/uL (ref 150–400)
RBC: 3.81 MIL/uL — ABNORMAL LOW (ref 3.87–5.11)
RDW: 13 % (ref 11.5–15.5)
WBC: 7.8 10*3/uL (ref 4.0–10.5)

## 2013-12-13 LAB — PROTIME-INR
INR: 1.24 (ref 0.00–1.49)
PROTHROMBIN TIME: 15.6 s — AB (ref 11.6–15.2)

## 2013-12-13 MED ORDER — WARFARIN SODIUM 3 MG PO TABS
3.0000 mg | ORAL_TABLET | Freq: Once | ORAL | Status: AC
  Administered 2013-12-13: 3 mg via ORAL
  Filled 2013-12-13: qty 1

## 2013-12-13 NOTE — Progress Notes (Addendum)
ANTICOAGULATION CONSULT NOTE - Follow Up Consult  Pharmacy Consult for coumadin and lovenox Indication: DVT  Allergies  Allergen Reactions  . Adhesive [Tape] Other (See Comments)    Tears skin  . Amoxicillin Rash  . Hydrocodone Nausea Only    Patient Measurements: Height: 5' (152.4 cm) Weight: 87 lb 1.3 oz (39.5 kg) IBW/kg (Calculated) : 45.5 Heparin Dosing Weight:   Vital Signs: Temp: 98.3 F (36.8 C) (08/03 0500) Temp src: Oral (08/03 0500) BP: 145/81 mmHg (08/03 0500) Pulse Rate: 94 (08/03 0500)  Labs:  Recent Labs  12/11/13 0950 12/12/13 0505 12/13/13 0553  HGB  --  10.9* 11.6*  HCT  --  33.3* 35.2*  PLT  --  300 314  LABPROT 13.9 14.9 15.6*  INR 1.07 1.17 1.24    Estimated Creatinine Clearance: 32.6 ml/min (by C-G formula based on Cr of 0.63).   Medications:  Scheduled:  . calcium carbonate  1,250 mg Oral BID WC  . enoxaparin  40 mg Subcutaneous Q12H  . feeding supplement (RESOURCE BREEZE)  1 Container Oral TID BM  . polyethylene glycol  17 g Oral Daily  . [START ON 12/15/2013] Vitamin D (Ergocalciferol)  50,000 Units Oral Q7 days  . Warfarin - Pharmacist Dosing Inpatient   Does not apply q1800   Infusions:    Assessment: [name] is currently on subtherapeutic coumadin bridging with treatment dose of lovenox.  INR today is up to 1.24. Hgb 11.6 and Plt 314 K Goal of Therapy:  Anti-Xa level 0.6-1 units/ml 4hrs after LMWH dose given; INR 2-3 Monitor platelets by anticoagulation protocol: Yes   Plan:  1. Lovenox 40mg  SQ q12h. 2. CBC q72h while on Lovenox 3. Daily INR 4. Coumadin 3mg  today  Sesar Madewell, Tsz-Yin 12/13/2013,8:13 AM

## 2013-12-13 NOTE — Progress Notes (Signed)
Physical Therapy Session Note  Patient Details  Name: Vanessa Pace MRN: [id number] Date of Birth: 1926-07-02  Today's Date: 12/13/2013 Time: 0900-1000 & 1300-1400 Time Calculation (min):60 min & 60 min  Short Term Goals: Week 1:  PT Short Term Goal 1 (Week 1): Pt to perform bed mobility with supervision, 50% of time  PT Short Term Goal 2 (Week 1): Pt to perform bed<>chair transfers with superivsion, 50% of time PT Short Term Goal 3 (Week 1): Pt to propel w/c 150' with supervision PT Short Term Goal 4 (Week 1): Pt to ambulate 150' with LRAD and supervision, 50% of time PT Short Term Goal 5 (Week 1): Pt to maintain standing balance during functional tasks w/ LRAD and supervision, 50% of time  Skilled Therapeutic Interventions/Progress Updates:   AM Session (60 min): Pt. Received seated in w/c with RN present and administering medications. Pt. Alert and oriented and agreeable to physical therapy.  1:1 Treatment focused on ambulation/gait tolerance, stairs, and w/c propulsion. Pt. Assist level is min A with occasional mod A for LOBs; Pt. Easily distracted and stumbles requiring additional walking/balance strategies. Pt. Ambulated 150 feet with RW and w/c follow by son Vanessa Pace) for safety. Pt. Required frequent verbal cues for [facility] sequencing and step length. Pt. Requires extra time to perform task and additional therapeutic rest/recovery breaks (x2) during ambulation. Pt. Propelled w/c 50' using LUE and limited BLEs.  Pt. Condition post therapy session: positioned seated in w/c  with all needs within reach; quick release in use for pt. safety.  PM Session (60 min): Pt. Received seated in recliner having completed lunch. Pt. States need to void prior to session. Agreeable to physical therapy. No family present at start of session; family friend "carol from church choir" present at session end.  1:1 Treatment focused on functional transfers (car, toilet) activity tolerance and gait with SBQC .  Pt. Assist level is min A with occasional mod A for LOBs; Pt. Easily distracted and stumbles requiring additional walking/balance strategies. Pt. Emesis during session intermittently with increased activity with limited warning. Pt. Required additional rest/recovery breaks due to physical distress related to emesis; RN notified. Car transfer x2 with demonstration and successful teach-back with verbal cues for proper technique and safety. Pt. Had one void with transfer training with [facility] and additional set-up by therapist for pericare and steady for both doffing and donning of clothing. Pt. Distracted by environmental changes, people, and changes in routine; pt to benefit from consistency of treatments/therapists.  Pain: no c/o pain.   Pt. Condition post therapy session: positioned in recliner with feet elevated with all needs within reach. Family friend present in room and looking after patient closely.   Therapy Documentation Precautions:  Precautions Precautions: Fall Precaution Comments: HOH Required Braces or Orthoses: Sling Restrictions Weight Bearing Restrictions: Yes RUE Weight Bearing: Partial weight bearing RLE Weight Bearing: Weight bearing as tolerated Pain: Pain Assessment Pain Assessment: 0-10 Pain Score: 1  Pain Type: Acute pain Pain Location: Elbow Pain Orientation: Right Pain Descriptors / Indicators: Shooting Pain Intervention(s): repositioned  Locomotion : Ambulation Ambulation/Gait Assistance: 4: Min assist   See FIM for current functional status  Therapy/Group: Individual Therapy  Juluis Mire 12/13/2013, 4:14 PM

## 2013-12-13 NOTE — Progress Notes (Signed)
[place] PHYSICAL MEDICINE & REHABILITATION     PROGRESS NOTE    Subjective/Complaints: Slept well. Right groin sore but tolerable. Denies any cough, cp or sob A  review of systems has been performed and if not noted above is otherwise negative.   Objective: Vital Signs: Blood pressure 145/81, pulse 94, temperature 98.3 F (36.8 C), temperature source Oral, resp. rate 18, height 5' (1.524 m), weight 39.5 kg (87 lb 1.3 oz), SpO2 97.00%. No results found.  Recent Labs  12/12/13 0505 12/13/13 0553  WBC 8.7 7.8  HGB 10.9* 11.6*  HCT 33.3* 35.2*  PLT 300 314   No results found for this basename: NA, K, CL, CO, GLUCOSE, BUN, CREATININE, CALCIUM,  in the last 72 hours  CBG (last 3)  No results found for this basename: GLUCAP,  in the last 72 hours  Wt Readings from Last 3 Encounters:  12/09/13 39.5 kg (87 lb 1.3 oz)  12/04/13 40.37 kg (89 lb)  12/04/13 40.37 kg (89 lb)    Physical Exam:  Constitutional: She is oriented to person, place, and time. Frail appearing HENT: oral mucosa moist Head: Normocephalic.  Eyes: EOM are normal.  Neck: Normal range of motion. Neck supple. No thyromegaly present.  Cardiovascular: Normal rate and regular rhythm. murmur Respiratory: Effort normal and breath sounds normal. No respiratory distress. No wheezes GI: Soft. Bowel sounds are normal. She exhibits no distension. No pain Neurological: She is alert and oriented to person, place, and time.  Follows full commands. hoh. Skin: Skin is warm and dry. Scattered ecchymoses and bruises Right upper extremity with soft cast and shoulder sling. Appropriately tender. Mild right hip pain with ROM.  left upper extremity 5/5 in deltoid, bicep, tricep, grip  Right grip is 4 minus she has pain with range of motion of her right fingers  Extremities show osteoarthritic changes bilateral fingers and toes  Right lower extremity 4 minus hip flexion knee extensor ankle dorsi flexion plantarflexion  Left  lower extremity 5/5 hip flexion knee extension ankle dorsiflexion plantar flexion  Psych: pleasant and cooperative   Assessment/Plan: 1. Functional deficits secondary to right olecranon/greater troch fx's which require 3+ hours per day of interdisciplinary therapy in a comprehensive inpatient rehab setting. Physiatrist is providing close team supervision and 24 hour management of active medical problems listed below. Physiatrist and rehab team continue to assess barriers to discharge/monitor patient progress toward functional and medical goals.  FIM: FIM - Bathing Bathing Steps Patient Completed: Chest;Right Arm;Left Arm;Abdomen;Left upper leg;Right upper leg;Buttocks;Front perineal area Bathing: 4: Min-Patient completes 8-9 30f 10 parts or 75+ percent  FIM - Upper Body Dressing/Undressing Upper body dressing/undressing steps patient completed: Thread/unthread left bra strap;Thread/unthread left sleeve of pullover shirt/dress;Pull shirt over trunk;Put head through opening of pull over shirt/dress Upper body dressing/undressing: 3: Mod-Patient completed 50-74% of tasks FIM - Lower Body Dressing/Undressing Lower body dressing/undressing steps patient completed: Thread/unthread left underwear leg;Pull underwear up/down;Thread/unthread left pants leg;Pull pants up/down Lower body dressing/undressing: 2: Max-Patient completed 25-49% of tasks  FIM - Toileting Toileting: 0: Activity did not occur (pt on bedrest today)  FIM - Radio producer Devices: Grab bars Toilet Transfers: 4-To toilet/BSC: Min A (steadying Pt. > 75%);4-From toilet/BSC: Min A (steadying Pt. > 75%)  FIM - Control and instrumentation engineer Devices: Walker;Arm rests Bed/Chair Transfer: 3: Supine > Sit: Mod A (lifting assist/Pt. 50-74%/lift 2 legs;3: Sit > Supine: Mod A (lifting assist/Pt. 50-74%/lift 2 legs);4: Bed > Chair or W/C: Min A (steadying  Pt. > 75%);4: Chair or W/C > Bed: Min A  (steadying Pt. > 75%)  FIM - Locomotion: Wheelchair Distance: 150' Locomotion: Wheelchair: 4: Travels 150 ft or more: maneuvers on rugs and over door sillls with minimal assistance (Pt.>75%) FIM - Locomotion: Ambulation Locomotion: Ambulation Assistive Devices: Nurse, adult Ambulation/Gait Assistance: 4: Min assist Locomotion: Ambulation: 2: Travels 50 - 149 ft with minimal assistance (Pt.>75%)  Comprehension Comprehension Mode: Auditory Comprehension: 5-Follows basic conversation/direction: With extra time/assistive device  Expression Expression Mode: Verbal Expression: 5-Expresses basic needs/ideas: With extra time/assistive device  Social Interaction Social Interaction: 6-Interacts appropriately with others with medication or extra time (anti-anxiety, antidepressant).  Problem Solving Problem Solving: 4-Solves basic 75 - 89% of the time/requires cueing 10 - 24% of the time  Memory Memory: 4-Recognizes or recalls 75 - 89% of the time/requires cueing 10 - 24% of the time  Medical Problem List and Plan:  1. Functional deficits secondary to fall sustaining a displaced right olecranon fracture and nondisplaced small right greater trochanteric femur fracture. Status post ORIF olecranon fracture. Nonweightbearing right upper extremity with platform walker weightbearing as tolerated right lower extremity  2. DVT: CFV dvt.    -coumadin/lovenox intiated after discussion with family   -resumed activity  3. Pain Management: Oxycodone as needed. Monitor with increased mobility  4. Constipation. Adjusted bowel program. No bm yet 5. Neuropsych: This patient is capable of making decisions on her own behalf.  6. Skin/Wound Care: Routine skin checks ongoing  LOS (Days) 4 A FACE TO FACE EVALUATION WAS PERFORMED  Shelle Galdamez T 12/13/2013 9:29 AM

## 2013-12-13 NOTE — Progress Notes (Signed)
Occupational Therapy Session Note  Patient Details  Name: Vanessa Pace MRN: [id number] Date of Birth: [birth year]  Today's Date: 12/13/2013 Time: 1100-1200 Time Calculation (min): 60 min  Short Term Goals: Week 1:  OT Short Term Goal 1 (Week 1): Pt. will donn and doff shoulder sling independently with increased time as needed in order to increase I with self care. OT Short Term Goal 2 (Week 1): Pt. will perform bathing, at sink side or EOB, with steady assist only in order to increase I with self care. OT Short Term Goal 3 (Week 1): Pt. will perform UB dressing with Min A in order to increased I with dressing. OT Short Term Goal 4 (Week 1): Pt. will perform LB dressing with min A in order to increase I in dressing skills.  Skilled Therapeutic Interventions/Progress Updates:    Engaged in ADL retraining with education weight bearing precautions and increased participation in self-care tasks of bathing and dressing.  Bathing and dressing completed at sit > stand level at sink with education on use of stool to assist with donning underwear and socks as pt reports unable to reach RLE due to discomfort with bending forward.  Educated on donning shoulder sling, pt still required setup and demonstration cues.  Educated on hooking bra in front to increase independence with UB dressing, however pt still required assist secondary to decreased grasp in Rt hand with cast.  Pt required multiple rest breaks throughout session due to fatigue.  Therapy Documentation Precautions:  Precautions Precautions: Fall Precaution Comments: HOH Required Braces or Orthoses: Sling Restrictions Weight Bearing Restrictions: Yes RUE Weight Bearing: Partial weight bearing RLE Weight Bearing: Weight bearing as tolerated Pain: Pain Assessment Pain Assessment: 0-10 Pain Score: 1  Pain Type: Acute pain Pain Location: Elbow Pain Orientation: Right Pain Intervention(s): Medication (See eMAR)  See FIM for current  functional status  Therapy/Group: Individual Therapy  Simonne Come 12/13/2013, 12:02 PM

## 2013-12-14 ENCOUNTER — Inpatient Hospital Stay (HOSPITAL_COMMUNITY): Payer: Medicare Other

## 2013-12-14 ENCOUNTER — Encounter (HOSPITAL_COMMUNITY): Payer: Medicare Other | Admitting: Occupational Therapy

## 2013-12-14 ENCOUNTER — Inpatient Hospital Stay (HOSPITAL_COMMUNITY): Payer: Medicare Other | Admitting: Occupational Therapy

## 2013-12-14 ENCOUNTER — Inpatient Hospital Stay (HOSPITAL_COMMUNITY): Payer: Medicare Other | Admitting: *Deleted

## 2013-12-14 DIAGNOSIS — Z5189 Encounter for other specified aftercare: Secondary | ICD-10-CM

## 2013-12-14 DIAGNOSIS — S72009A Fracture of unspecified part of neck of unspecified femur, initial encounter for closed fracture: Secondary | ICD-10-CM

## 2013-12-14 LAB — PROTIME-INR
INR: 1.78 — AB (ref 0.00–1.49)
PROTHROMBIN TIME: 20.7 s — AB (ref 11.6–15.2)

## 2013-12-14 MED ORDER — WARFARIN SODIUM 2.5 MG PO TABS
2.5000 mg | ORAL_TABLET | Freq: Once | ORAL | Status: AC
  Administered 2013-12-14: 2.5 mg via ORAL
  Filled 2013-12-14: qty 1

## 2013-12-14 NOTE — Progress Notes (Signed)
Physical Therapy Session Note  Patient Details  Name: Vanessa Pace MRN: [id number] Date of Birth: 11-Oct-1926  Today's Date: 12/14/2013 Time: 0900-1000 & 1250-1320 Time Calculation (min): 60 min & 30 min  Short Term Goals: Week 1:  PT Short Term Goal 1 (Week 1): Pt to perform bed mobility with supervision, 50% of time  PT Short Term Goal 2 (Week 1): Pt to perform bed<>chair transfers with superivsion, 50% of time PT Short Term Goal 3 (Week 1): Pt to propel w/c 150' with supervision PT Short Term Goal 4 (Week 1): Pt to ambulate 150' with LRAD and supervision, 50% of time PT Short Term Goal 5 (Week 1): Pt to maintain standing balance during functional tasks w/ LRAD and supervision, 50% of time  Skilled Therapeutic Interventions/Progress Updates:  AM Session (60 min): Pt. Received seated in w/c infront of sink having completed OT. Pt. Agreeable to physical therapy  1:1 Treatment focused on gait/ambulation and stairs. Pt. Assist level is min A with SBQC. Pt. Ambulated 165', 80', and 25' with verbal cues for sequencing of SBQC, safety/mgmt. Of environmental distractions, and frequent reassurances to patient. Stairs 2x5steps with SBQC only with sequencing cues at min-mod A for ascend/descend; pt. Had one LOB at start of stairs that required therapist to guide pt. For safety and to reset for task.   Pain: no c/o of pain.  Pt. Condition post therapy session: positioned seated in recliner with all needs within reach. Daughter present and attending to patients additional needs.   PM Session (30 min): Pt. Received seated on toilet with RN present for patient hand-off. Pt.agreeable to physical therapy. Completed toileting with min A/steady for pericare with daughter present for family education.  1:1 Treatment focused on family education for pt's safety with gait/ambulation and stairs 2 steps x 2 up/down. Pt. Assist level is Min A with SBQC. Treatment consisted of gait/ambulation of 14' with SBQC  cues for sequencing; 2 stairs with [facility] only; daughter present for family education for safety and proper cueing and guarding of patient. Pt. Tolerated activity well and had 2 LOBs with min A to correct for balance; pt requires frequent verbal and tactile cues for sequencing and safety.  Pain: no c/o pain  Pt. Condition post therapy session: positioned seated in recliner with all needs within reach. Daughter present.  Therapy Documentation Precautions:  Precautions Precautions: Fall Precaution Comments: HOH Required Braces or Orthoses: Sling Restrictions Weight Bearing Restrictions: Yes RUE Weight Bearing: Partial weight bearing RLE Weight Bearing: Weight bearing as tolerated Locomotion : Ambulation Ambulation/Gait Assistance: 4: Min guard   See FIM for current functional status  Therapy/Group: Individual Therapy  Juluis Mire 12/14/2013, 1:48 PM

## 2013-12-14 NOTE — Progress Notes (Signed)
ANTICOAGULATION CONSULT NOTE - Follow Up Consult  Pharmacy Consult for coumadin and lovenox Indication: DVT  Allergies  Allergen Reactions  . Adhesive [Tape] Other (See Comments)    Tears skin  . Amoxicillin Rash  . Hydrocodone Nausea Only    Patient Measurements: Height: 5' (152.4 cm) Weight: 87 lb 1.3 oz (39.5 kg) IBW/kg (Calculated) : 45.5 Heparin Dosing Weight:   Vital Signs: Temp: 98.5 F (36.9 C) (08/04 0611) Temp src: Oral (08/04 0611) BP: 116/83 mmHg (08/04 0611) Pulse Rate: 90 (08/04 0611)  Labs:  Recent Labs  12/11/13 0950 12/12/13 0505 12/13/13 0553  HGB  --  10.9* 11.6*  HCT  --  33.3* 35.2*  PLT  --  300 314  LABPROT 13.9 14.9 15.6*  INR 1.07 1.17 1.24    Estimated Creatinine Clearance: 32.6 ml/min (by C-G formula based on Cr of 0.63).   Medications:  Scheduled:  . calcium carbonate  1,250 mg Oral BID WC  . enoxaparin  40 mg Subcutaneous Q12H  . feeding supplement (RESOURCE BREEZE)  1 Container Oral TID BM  . polyethylene glycol  17 g Oral Daily  . [START ON 12/15/2013] Vitamin D (Ergocalciferol)  50,000 Units Oral Q7 days  . Warfarin - Pharmacist Dosing Inpatient   Does not apply q1800   Infusions:    Assessment: [name] is currently on subtherapeutic coumadin bridging with treatment dose of lovenox.  Day 5 of 5 of VTE overlap.  INR today is up to 1.78.  Goal of Therapy:  Anti-Xa level 0.6-1 units/ml 4hrs after LMWH dose given; INR 2-3 Monitor platelets by anticoagulation protocol: Yes   Plan:  1. Lovenox 40mg  SQ q12h. 2. CBC q72h while on Lovenox 3. Daily INR 4. Coumadin 2.5  mg today   Jacara Benito, Tsz-Yin 12/14/2013,8:07 AM

## 2013-12-14 NOTE — Progress Notes (Signed)
Physical Therapy Session Note  Patient Details  Name: Vanessa Pace MRN: [id number] Date of Birth: [birth year]  Today's Date: 12/14/2013 Time: 14:30-15:30 (48min) Make-up tx.   Short Term Goals: Week 1:  PT Short Term Goal 1 (Week 1): Pt to perform bed mobility with supervision, 50% of time  PT Short Term Goal 2 (Week 1): Pt to perform bed<>chair transfers with superivsion, 50% of time PT Short Term Goal 3 (Week 1): Pt to propel w/c 150' with supervision PT Short Term Goal 4 (Week 1): Pt to ambulate 150' with LRAD and supervision, 50% of time PT Short Term Goal 5 (Week 1): Pt to maintain standing balance during functional tasks w/ LRAD and supervision, 50% of time:     Skilled Therapeutic Interventions/Progress Updates:  Make-up tx focused on therex and activity tolerance for strengthening. Pt very fatigued from long day of tx, but agreeable to bedside tx. Pt just received pain meds, fearful of getting nauseated, but did not during this tx. Pt c/o intermittent LBA and hip pain throughout, but no >1/10. Provided hot pack and manual therapy with good pain relief. Pt able to complete all tasks with periodic rest breaks due to fatigue.   Pt instructed in the following ex semi-reclined and seated in recliner 1-2x10 each with cues for technique and modifications prn. - ankle pumps, quad sets, glute sets, heel slides, and SAQ with 5sec holds,    - LAQ with 5 sec holds, marching, and hip ADD squeeze with 5 sec holds Pt able to perform all active motions in relatively full ROM.   Performed passive HS and heel cord stretch x47min  as well as manual therapy x32min at bil scapula and cervical muscles due to tension and discomfort from sling.  Pt left up in recliner with daughter and all needs in reach.     Therapy Documentation Precautions:  Precautions Precautions: Fall Precaution Comments: HOH Required Braces or Orthoses: Sling Restrictions Weight Bearing Restrictions: Yes Vanessa Weight Bearing:  Partial weight bearing RLE Weight Bearing: Weight bearing as tolerated      Pain: 0/10 at rest    Ambulation Ambulation/Gait Assistance: 4: Min guard   See FIM for current functional status  Therapy/Group: Individual Therapy Kennieth Rad, PT, DPT  12/14/2013, 2:42 PM

## 2013-12-14 NOTE — Progress Notes (Signed)
[facility] PHYSICAL MEDICINE & REHABILITATION     PROGRESS NOTE    Subjective/Complaints: Had a lot of nausea and vomiting yesterday. Feels a little better this am.  A  review of systems has been performed and if not noted above is otherwise negative.   Objective: Vital Signs: Blood pressure 116/83, pulse 90, temperature 98.5 F (36.9 C), temperature source Oral, resp. rate 17, height 5' (1.524 m), weight 39.5 kg (87 lb 1.3 oz), SpO2 99.00%. No results found.  Recent Labs  12/12/13 0505 12/13/13 0553  WBC 8.7 7.8  HGB 10.9* 11.6*  HCT 33.3* 35.2*  PLT 300 314   No results found for this basename: NA, K, CL, CO, GLUCOSE, BUN, CREATININE, CALCIUM,  in the last 72 hours  CBG (last 3)  No results found for this basename: GLUCAP,  in the last 72 hours  Wt Readings from Last 3 Encounters:  12/09/13 39.5 kg (87 lb 1.3 oz)  12/04/13 40.37 kg (89 lb)  12/04/13 40.37 kg (89 lb)    Physical Exam:  Constitutional: She is oriented to person, place, and time. Frail appearing HENT: oral mucosa moist Head: Normocephalic.  Eyes: EOM are normal.  Neck: Normal range of motion. Neck supple. No thyromegaly present.  Cardiovascular: Normal rate and regular rhythm. murmur Respiratory: Effort normal and breath sounds normal. No respiratory distress. No wheezes GI: Soft. Bowel sounds are normal. She exhibits no distension. No pain Neurological: She is alert and oriented to person, place, and time.  Follows full commands. hoh. Skin: Skin is warm and dry. Scattered ecchymoses and bruises Right upper extremity with soft cast and shoulder sling. Appropriately tender.  right hip pain with ROM.  left upper extremity 5/5 in deltoid, bicep, tricep, grip  Right grip is 4 minus she has pain with range of motion of her right fingers  Extremities show osteoarthritic changes bilateral fingers and toes  Right lower extremity 4 minus hip flexion knee extensor ankle dorsi flexion plantarflexion  Left  lower extremity 5/5 hip flexion knee extension ankle dorsiflexion plantar flexion  Psych: pleasant and cooperative   Assessment/Plan: 1. Functional deficits secondary to right olecranon/greater troch fx's which require 3+ hours per day of interdisciplinary therapy in a comprehensive inpatient rehab setting. Physiatrist is providing close team supervision and 24 hour management of active medical problems listed below. Physiatrist and rehab team continue to assess barriers to discharge/monitor patient progress toward functional and medical goals.  FIM: FIM - Bathing Bathing Steps Patient Completed: Chest;Right Arm;Abdomen;Left upper leg;Right upper leg;Buttocks;Front perineal area Bathing: 3: Mod-Patient completes 5-7 37f 10 parts or 50-74%  FIM - Upper Body Dressing/Undressing Upper body dressing/undressing steps patient completed: Thread/unthread right sleeve of pullover shirt/dresss;Thread/unthread left sleeve of pullover shirt/dress;Put head through opening of pull over shirt/dress;Thread/unthread right bra strap;Thread/unthread left bra strap Upper body dressing/undressing: 4: Min-Patient completed 75 plus % of tasks FIM - Lower Body Dressing/Undressing Lower body dressing/undressing steps patient completed: Pull underwear up/down;Pull pants up/down Lower body dressing/undressing: 2: Max-Patient completed 25-49% of tasks  FIM - Toileting Toileting steps completed by patient: Adjust clothing prior to toileting;Performs perineal hygiene;Adjust clothing after toileting Toileting Assistive Devices: Grab bar or rail for support Toileting: 0: Activity did not occur  FIM - Radio producer Devices: Grab bars Toilet Transfers: 0-Activity did not occur  FIM - Control and instrumentation engineer Devices: Secretary/administrator) Bed/Chair Transfer: 4: Chair or W/C > Bed: Min A (steadying Pt. > 75%);4: Bed > Chair or W/C: Min A (  steadying Pt. > 75%)  FIM -  Locomotion: Wheelchair Distance: 25 Locomotion: Wheelchair: 4: Travels 150 ft or more: maneuvers on rugs and over door sillls with minimal assistance (Pt.>75%) FIM - Locomotion: Ambulation Locomotion: Ambulation Assistive Devices: Scientist, forensic) Ambulation/Gait Assistance: 4: Min assist Locomotion: Ambulation: 4: Travels 150 ft or more with minimal assistance (Pt.>75%)  Comprehension Comprehension Mode: Auditory Comprehension: 5-Understands complex 90% of the time/Cues < 10% of the time  Expression Expression Mode: Verbal Expression: 5-Expresses complex 90% of the time/cues < 10% of the time  Social Interaction Social Interaction: 6-Interacts appropriately with others with medication or extra time (anti-anxiety, antidepressant).  Problem Solving Problem Solving: 5-Solves basic 90% of the time/requires cueing < 10% of the time  Memory Memory: 5-Recognizes or recalls 90% of the time/requires cueing < 10% of the time  Medical Problem List and Plan:  1. Functional deficits secondary to fall sustaining a displaced right olecranon fracture and nondisplaced small right greater trochanteric femur fracture. Status post ORIF olecranon fracture. Nonweightbearing right upper extremity with platform walker weightbearing as tolerated right lower extremity  2. DVT: CFV dvt.    -coumadin/lovenox intiated after discussion with family   -resumed activity  3. Pain Management: Oxycodone as needed. Monitor with increased mobility  4. Constipation. Adjusted bowel program. No bm yet 5. Neuropsych: This patient is capable of making decisions on her own behalf.  6. Skin/Wound Care: Routine skin checks ongoing 7. Nausea/vomiting--unclear source, ?dietary. No obvious pharm cause. Moved bowels yesterday  -encourage fluids and po intake. Observe today. Seemed comfortable this morning  LOS (Days) 5 A FACE TO FACE EVALUATION WAS PERFORMED  Jaxson Anglin T 12/14/2013 9:33 AM

## 2013-12-14 NOTE — Progress Notes (Signed)
Occupational Therapy Session Notes  Patient Details  Name: PALOMA GRANGE MRN: [id number] Date of Birth: [birth year]  Today's Date: 12/14/2013  Short Term Goals: Week 1:  OT Short Term Goal 1 (Week 1): Pt. will donn and doff shoulder sling independently with increased time as needed in order to increase I with self care. OT Short Term Goal 2 (Week 1): Pt. will perform bathing, at sink side or EOB, with steady assist only in order to increase I with self care. OT Short Term Goal 3 (Week 1): Pt. will perform UB dressing with Min A in order to increased I with dressing. OT Short Term Goal 4 (Week 1): Pt. will perform LB dressing with min A in order to increase I in dressing skills.  Skilled Therapeutic Interventions/Progress Updates:   Session #1 [phone] - 60 Minutes Individual Therapy No complaints of direct pain, patient with complaints of fatigue throughout session Patient received supine in bed with daughter present. Patient with complaints of nausea & vomiting yesterday. Patient engaged in bed mobility with min encouragement. Patient stood with quad cane and ambulated into bathroom with minimal assistance and min verbal cues for correct mechanics of functional ambulation. Patient then transferred, onto shower seat with min assist, therapist covered RUE, and patient completed UB/LB bathing in sit<>stand position with minimal assistance. Patient completed UB dressing in shower with min assist for hooking of bra and pulling shirt down back. Patient then ambulated > sink for grooming task of brushing hair, then to recliner for LB dressing. Patient required max assist with LB dressing, needing assistance with threading bilateral feet & donning bilateral socks; plan to work with patient on a LB dressing intervention to increase independence with this task. Patient's daughter, [name] present entire session and provided assistance. Therapist encouraged daughter to let patient do as much for herself as  possible for increased independence and quality of life. At end of session, left patient seated in recliner with all needs within reach, including breakfast tray, and daughter present.   Session #2 1330-1400 - 30 Minutes Individual Therapy No direct complaints of pain Patient received seated in recliner with daughter, [name] present. Therapist talked with [name] about functional transfers and [facility] assisted patient with recliner > w/c stand pivot transfer using quad cane. Therapist propelled patient > ADL apartment and patient engaged in functional ambulation and tub/shower transfer on/off tub transfer bench. Therapist demonstrated transfer and patient & [name] return demonstrated without needing assistance from therapist. Therapist started education regarding d/c planning with patient and [name]. Therapist educated patient and [name] on importance of always having gripper socks OR shoes on during functional ambulation, turning lights on when ambulating, using tub transfer bench in shower, using BSC over toilet seat in BR. Both patient and [name] seemed receptive to this information. Therapist propelled patient back to room and [name] assisted with transferring patient back to recliner. At end of session, left patient seated in recliner with blanket donned and [name] present.   Precautions:  Precautions Precautions: Fall Precaution Comments: HOH Required Braces or Orthoses: Sling Restrictions Weight Bearing Restrictions: Yes RUE Weight Bearing: Partial weight bearing RLE Weight Bearing: Weight bearing as tolerated  See FIM for current functional status  Garrison Michie 12/14/2013, 7:20 AM

## 2013-12-15 ENCOUNTER — Encounter (HOSPITAL_COMMUNITY): Payer: Medicare Other | Admitting: Occupational Therapy

## 2013-12-15 ENCOUNTER — Inpatient Hospital Stay (HOSPITAL_COMMUNITY): Payer: Medicare Other | Admitting: Physical Therapy

## 2013-12-15 ENCOUNTER — Inpatient Hospital Stay (HOSPITAL_COMMUNITY): Payer: Medicare Other | Admitting: Occupational Therapy

## 2013-12-15 DIAGNOSIS — Z5189 Encounter for other specified aftercare: Secondary | ICD-10-CM

## 2013-12-15 DIAGNOSIS — S72009A Fracture of unspecified part of neck of unspecified femur, initial encounter for closed fracture: Secondary | ICD-10-CM

## 2013-12-15 LAB — CBC
HCT: 33.5 % — ABNORMAL LOW (ref 36.0–46.0)
Hemoglobin: 10.8 g/dL — ABNORMAL LOW (ref 12.0–15.0)
MCH: 30.3 pg (ref 26.0–34.0)
MCHC: 32.2 g/dL (ref 30.0–36.0)
MCV: 94.1 fL (ref 78.0–100.0)
PLATELETS: 309 10*3/uL (ref 150–400)
RBC: 3.56 MIL/uL — ABNORMAL LOW (ref 3.87–5.11)
RDW: 12.9 % (ref 11.5–15.5)
WBC: 6.1 10*3/uL (ref 4.0–10.5)

## 2013-12-15 LAB — PROTIME-INR
INR: 1.84 — ABNORMAL HIGH (ref 0.00–1.49)
Prothrombin Time: 21.3 seconds — ABNORMAL HIGH (ref 11.6–15.2)

## 2013-12-15 MED ORDER — WARFARIN SODIUM 3 MG PO TABS
3.0000 mg | ORAL_TABLET | Freq: Once | ORAL | Status: AC
  Administered 2013-12-15: 3 mg via ORAL
  Filled 2013-12-15: qty 1

## 2013-12-15 NOTE — Progress Notes (Signed)
NUTRITION FOLLOW-UP  DOCUMENTATION CODES Per approved criteria  -Severe malnutrition in the context of chronic illness   Pt meets criteria for severe MALNUTRITION in the context of chronic illness as evidenced by severe fat and muscle depletion, <75% of estimated energy intake x 1 month.  INTERVENTION: -Continue Resource Breeze po TID, each supplement provides 250 kcal and 9 grams. -Recommend obtaining new weight to accurately assess weight trends.  NUTRITION DIAGNOSIS: Inadequate oral intake related to decreased appetite, masticatory difficulty as evidenced by PO: 50%, severe fat and muscle depletion; Resolved.  NEW NUTRITION Dx: Malnutrition related to chronic illness as evidenced by severe fat and muscle mass depletion.  Goal: Pt will meet >90% of estimated nutritional needs, met  Monitor:  PO/supplement intake, labs, weight changes, I/O's  80 y.o. female  Admitting Dx: Fall  ASSESSMENT: Pt with unremarkable past medical history admitted 12/04/2013 after a fall when she tripped over her cat in the house. Patient independent prior to admission living with her family.   7/31-Hx obtained from pt and daughter at bedside. Confirm poor appetite and weight loss. Pt reports UBW of 112#, which she last weighed more than a year ago. Neither pt or daughter are able to provide any meaningful wt hx.  Pt reports that appetite is starting to improve. PO: 50% of meals. PTA daughter confirmed very poor appetite, with pt eating only small portions of meals. She has tried Ensure supplement at home, but does not like milky consistency. Daughter reports pt would take Slim Fast supplement at home. She is agreeable to try [employer]. Pt also complains of masticatory difficulty, particularly with meats. She is agreeable to diet downgrade for ease of intake.Educated pt on importance of good PO intake to support healing process and improve nutritional status.   8/5- Pt reports having a good  appetite. Meal completion 100%. Pt did report that she has nausea from time to time, but it is improving. Pt reports drinking the [employer]. Pt wants to continue with the [employer].  There is no new weight on record. Recommend getting new weight to assess weight trends.   Height: Ht Readings from Last 1 Encounters:  12/11/13 5' (1.524 m)    Weight: Wt Readings from Last 1 Encounters:  12/09/13 87 lb 1.3 oz (39.5 kg)   Wt Readings from Last 10 Encounters:  12/09/13 87 lb 1.3 oz (39.5 kg)  12/04/13 89 lb (40.37 kg)  12/04/13 89 lb (40.37 kg)  12/04/13 89 lb (40.37 kg)  12/04/13 89 lb (40.37 kg)    BMI:  Body mass index is 17.01 kg/(m^2). Underweight  Estimated Nutritional Needs: Kcal: 1200-1400 Protein: 50-60 grams Fluid: 1.2-1.4 L  Skin: rt arm ecchymosis, non-pitting RUE and RLE edema  Diet Order: General Dysphagia 2   Intake/Output Summary (Last 24 hours) at 12/15/13 1521 Last data filed at 12/15/13 1300  Gross per 24 hour  Intake    560 ml  Output      0 ml  Net    560 ml    Last BM: 8/6  Labs:   Recent Labs Lab 12/10/13 0725  NA 136*  K 4.4  CL 96  CO2 28  BUN 13  CREATININE 0.63  CALCIUM 9.3  GLUCOSE 96    CBG (last 3)  No results found for this basename: GLUCAP,  in the last 72 hours  Scheduled Meds: . calcium carbonate  1,250 mg Oral BID WC  . enoxaparin  40 mg Subcutaneous Q12H  . feeding  supplement (RESOURCE BREEZE)  1 Container Oral TID BM  . polyethylene glycol  17 g Oral Daily  . Vitamin D (Ergocalciferol)  50,000 Units Oral Q7 days  . warfarin  3 mg Oral ONCE-1800  . Warfarin - Pharmacist Dosing Inpatient   Does not apply q1800    Continuous Infusions:   Past Medical History  Diagnosis Date  . Arthritis   . Family history of anesthesia complication     Daughter is difficult to intubate   . Elbow fracture 12/04/2013    rt elbow    Past Surgical History  Procedure Laterality Date  . Fracture surgery      right  hip  . Femur surgery    . Orif elbow fracture Right 12/06/2013    Procedure: OPEN REDUCTION INTERNAL FIXATION (ORIF) ELBOW/OLECRANON FRACTURE;  Surgeon: Jolyn Nap, MD;  Location: [facility];  Service: Orthopedics;  Laterality: Right;    Kallie Locks, MS, Provisional LDN Pager # [phone] After hours/ weekend pager # [phone]

## 2013-12-15 NOTE — Evaluation (Signed)
Physical Therapy Vestibular Assessment and Plan  Patient Details  Name: Vanessa Pace MRN: [id number] Date of Birth: 03-01-1927  PT Diagnosis: Dizziness and Giddiness, Motion Sensitivity  Today's Date: 12/15/2013 Time: 1030-1102 and 0814-4818 Time Calculation (min): 32 min and 66 min  Problem List:  Patient Active Problem List   Diagnosis Date Noted  . Fall 12/09/2013  . Fracture of olecranon process of right ulna 12/08/2013  . Hip fracture 12/04/2013  . Closed fracture of right ulna 12/04/2013  . Minor head injury without loss of consciousness 12/04/2013  . Leucocytosis 12/04/2013    Past Medical History:  Past Medical History  Diagnosis Date  . Arthritis   . Family history of anesthesia complication     Daughter is difficult to intubate   . Elbow fracture 12/04/2013    rt elbow   Past Surgical History:  Past Surgical History  Procedure Laterality Date  . Fracture surgery      right hip  . Femur surgery    . Orif elbow fracture Right 12/06/2013    Procedure: OPEN REDUCTION INTERNAL FIXATION (ORIF) ELBOW/OLECRANON FRACTURE;  Surgeon: Jolyn Nap, MD;  Location: [facility];  Service: Orthopedics;  Laterality: Right;   Subjective: Pt and daughter report history of "vertigo"; unable to verify time of onset. Pt/daughter also report bilat,  hearing loss and intermittent tinnitus bilaterally. Unable to verify description, duration of current symptoms. During this session, pt reported visual aura/disturbance described as seeing "lattices" in visual field for <10 seconds.   Per chart, pt with episodes of emesis upon getting out of bed with therapies. Daughter reports pt complaint of "spinning" when getting out of bed during past week; pt unable to verify. Per chart, pt reported no LOC with fall prior to hospitalization; however, pt uncertain when discussed during this session.  Vestibular Evaluation Findings:  Eye Alignment Grossly WNL.  Spontaneous  Nystagmus Not observed.   Gaze holding nystagmus Not observed.  Smooth pursuit Occular range WNL; increased signs of disequilibrium (closing eyes, blinking) with visual tracking downward and end range to L side.  Oculomotor Convergence WNL.  Saccades Grossly WNL with saccades to R side and upward. Difficult to assess saccades to L of midline and downward secondary to pt tendency to close eyes.  VOR slow Appears grossly WNL with horizontal and vertical head movement.  Head Thrust Test L Head Thrust Test appears (+); however, difficult to assess secondary to decreased sustained attention and cervical spine muscle guarding preventing passive head movement.  Head Shaking Nystagmus Not formally assessed.  Modified Rt. Hallpike Dix (-) Modified by placing bed in Trendelenburg position secondary to pt discomfort with laying supine performing cervical spine extension.  Modified Lt. Hallpike Dix (-) Modified by placing bed in Trendelenburg position and rolling to L side secondary to pt discomfort with laying supine, performing cervical spine extension.  Modified Rt. Roll Test (-)  Modified by elevating HOB 30 degrees  secondary to pt discomfort in supine position.  Modified Lt. Roll Test  (-) Modified by elevating HOB 30 degrees and rolling to L side secondary to pt discomfort in supine.          Visual- Vestibular Interactions:  - Sitting: not observed. Signs/symptoms of disequilibrium (increased blinking, tendency to close eyes) with looking downward in seated. - Supine > Sit: increased dizziness, lightheadedness (< 30 seconds) with no noted nystagmus. - Standing: Pt reporting lightheadedness and exhibiting multidirectional postural sway (duration approximately 15 seconds) immediately following sit>stand. Seated vitals: BP 121/65, HR  95, SpO2 95%. Standing vitals: BP 127/79, HR 116, SpO2 92%. - Tandem: not formally assessed.  - Walking: increased symptoms and staggering gait, report of dysequilibrium with turn to L side  during ambulation in controlled environment with SBQC. Gait trial ended soon thereafter secondary to continued staggering with linear gait.  Findings: Vestibular exam limited by decreased sustained attention and difficulty obtaining accurate history from patient. Findings suggest motion sensitivity with supine>sit, sit>stand, and forward trunk lean in seated. With visual tracking downward and to L side, pt demonstrates signs of disequilibrium (increased blinking, closing eyes). Unable to rule out left sided vestibular hypofunction, as L Head Thrust Test appeared positive initially but was difficult to reproduce secondary to pt muscle guarding. Positional vertigo ruled out by (-) Modified Dix-Hallpike and Roll Tests.   Plan:  1. Habituation: by exposing pt to noxious stimuli (supine>sit, sit>stand, horizontal/vertical head movements, and forward trunk lean in seated), symptoms caused by position/movement will decrease over time. Goal is production of mild, temporary symptoms. (up to 6-7/10 intensity) 2. Adaptation: due to inability to rule out unilateral vestibular hypofunction, recommend pt perform VOR exercises (x1 viewing with horizontal and vertical head turning) starting in seated and progressing to standing. Cue pt to increase speed of head movement as long as visual target remains in focus.   Skilled Therapeutic Intervention Explained, demonstrated VOR x1 exercises for with horizontal and vertical head turning. Educated pt/daughter on use of adaptation exercises to help the nervous system adapt to loss of vestibular input. Pt/daughter verbalized understanding and pt gave effective return demonstration of exercises.  Precautions/Restrictions Precautions Precautions: Fall Precaution Comments: HOH Required Braces or Orthoses: Sling Restrictions Weight Bearing Restrictions: Yes RUE Weight Bearing: Partial weight bearing RLE Weight Bearing: Weight bearing as tolerated General   Vital  SignsTherapy Vitals Temp: 98.6 F (37 C) Temp src: Oral Pulse Rate: 91 Resp: 18 BP: 116/74 mmHg Patient Position (if appropriate): Lying Oxygen Therapy SpO2: 95 % O2 Device: None (Room air) Pain Pain Assessment Pain Assessment: No/denies pain  Hobble, Blair A 12/15/2013, 9:19 PM

## 2013-12-15 NOTE — Progress Notes (Signed)
[facility] PHYSICAL MEDICINE & REHABILITATION     PROGRESS NOTE    Subjective/Complaints: Had a better day yesterday. Made it thru therapies.  A  review of systems has been performed and if not noted above is otherwise negative.   Objective: Vital Signs: Blood pressure 113/62, pulse 90, temperature 98.8 F (37.1 C), temperature source Oral, resp. rate 17, height 5' (1.524 m), weight 39.5 kg (87 lb 1.3 oz), SpO2 94.00%. No results found.  Recent Labs  12/13/13 0553 12/15/13 0535  WBC 7.8 6.1  HGB 11.6* 10.8*  HCT 35.2* 33.5*  PLT 314 309   No results found for this basename: NA, K, CL, CO, GLUCOSE, BUN, CREATININE, CALCIUM,  in the last 72 hours  CBG (last 3)  No results found for this basename: GLUCAP,  in the last 72 hours  Wt Readings from Last 3 Encounters:  12/09/13 39.5 kg (87 lb 1.3 oz)  12/04/13 40.37 kg (89 lb)  12/04/13 40.37 kg (89 lb)    Physical Exam:  Constitutional: She is oriented to person, place, and time. Frail appearing HENT: oral mucosa moist Head: Normocephalic.  Eyes: EOM are normal.  Neck: Normal range of motion. Neck supple. No thyromegaly present.  Cardiovascular: Normal rate and regular rhythm. murmur Respiratory: Effort normal and breath sounds normal. No respiratory distress. No wheezes GI: Soft. Bowel sounds are normal. She exhibits no distension. No pain Neurological: She is alert and oriented to person, place, and time.  Follows full commands. hoh. Skin: Skin is warm and dry. Scattered ecchymoses and bruises Right upper extremity with soft cast and shoulder sling. Appropriately tender.  right hip pain with ROM.  left upper extremity 5/5 in deltoid, bicep, tricep, grip  Right grip is 4 minus she has pain with range of motion of her right fingers  Extremities show osteoarthritic changes bilateral fingers and toes  Right lower extremity 4 minus hip flexion knee extensor ankle dorsi flexion plantarflexion  Left lower extremity 5/5 hip  flexion knee extension ankle dorsiflexion plantar flexion  Psych: pleasant and cooperative   Assessment/Plan: 1. Functional deficits secondary to right olecranon/greater troch fx's which require 3+ hours per day of interdisciplinary therapy in a comprehensive inpatient rehab setting. Physiatrist is providing close team supervision and 24 hour management of active medical problems listed below. Physiatrist and rehab team continue to assess barriers to discharge/monitor patient progress toward functional and medical goals.  FIM: FIM - Bathing Bathing Steps Patient Completed: Chest;Right Arm;Abdomen;Left upper leg;Right upper leg;Buttocks;Front perineal area Bathing: 3: Mod-Patient completes 5-7 30f 10 parts or 50-74%  FIM - Upper Body Dressing/Undressing Upper body dressing/undressing steps patient completed: Thread/unthread right sleeve of pullover shirt/dresss;Thread/unthread left sleeve of pullover shirt/dress;Put head through opening of pull over shirt/dress;Thread/unthread right bra strap;Thread/unthread left bra strap Upper body dressing/undressing: 4: Min-Patient completed 75 plus % of tasks FIM - Lower Body Dressing/Undressing Lower body dressing/undressing steps patient completed: Pull underwear up/down;Pull pants up/down Lower body dressing/undressing: 2: Max-Patient completed 25-49% of tasks  FIM - Toileting Toileting steps completed by patient: Adjust clothing prior to toileting;Performs perineal hygiene;Adjust clothing after toileting Toileting Assistive Devices: Grab bar or rail for support Toileting: 0: Activity did not occur  FIM - Radio producer Devices: Grab bars;Cane Toilet Transfers: 4-From toilet/BSC: Min A (steadying Pt. > 75%)  FIM - Bed/Chair Transfer Bed/Chair Transfer Assistive Devices: Cane [facility]) Bed/Chair Transfer: 4: Chair or W/C > Bed: Min A (steadying Pt. > 75%);4: Bed > Chair or W/C: Min A (steadying  Pt. > 75%)  FIM -  Locomotion: Wheelchair Distance: 25 Locomotion: Wheelchair: 0: Activity did not occur FIM - Locomotion: Ambulation Locomotion: Ambulation Assistive Devices: Nurse, adult Ambulation/Gait Assistance: 4: Min guard Locomotion: Ambulation: 4: Travels 150 ft or more with minimal assistance (Pt.>75%)  Comprehension Comprehension Mode: Auditory Comprehension: 5-Understands basic 90% of the time/requires cueing < 10% of the time  Expression Expression Mode: Verbal Expression: 4-Expresses basic 75 - 89% of the time/requires cueing 10 - 24% of the time. Needs helper to occlude trach/needs to repeat words.  Social Interaction Social Interaction: 6-Interacts appropriately with others with medication or extra time (anti-anxiety, antidepressant).  Problem Solving Problem Solving: 5-Solves basic 90% of the time/requires cueing < 10% of the time  Memory Memory: 5-Recognizes or recalls 90% of the time/requires cueing < 10% of the time  Medical Problem List and Plan:  1. Functional deficits secondary to fall sustaining a displaced right olecranon fracture and nondisplaced small right greater trochanteric femur fracture. Status post ORIF olecranon fracture. Nonweightbearing right upper extremity with platform walker weightbearing as tolerated right lower extremity  2. DVT: Right CFV dvt.    -coumadin/lovenox intiated after discussion with family   -resumed activity  3. Pain Management: Oxycodone as needed. Monitor with increased mobility  4. Constipation. Adjusted bowel program. No bm yet 5. Neuropsych: This patient is capable of making decisions on her own behalf.  6. Skin/Wound Care: Routine skin checks ongoing 7. Nausea/vomiting--unclear source, ?dietary. No obvious pharm cause. Moving bowels  -had a better day yesterday and denies issues today. LOS (Days) 6 A FACE TO FACE EVALUATION WAS PERFORMED  SWARTZ,ZACHARY T 12/15/2013 8:27 AM

## 2013-12-15 NOTE — Progress Notes (Signed)
Social Work Patient ID: Patricia Pesa, female   DOB: 07/16/1926, 80 y.o.   MRN: [id number]  Met with pt and daughter today to review team conference.  Both aware and agreeable with targeted d/c date of 8/11 with supervision goals.  Daughter, [name], to provide my name and contact info to other sibilings for them to contact me to schedule family ed.  Pt denies any concerns about managing at home.  Daughter's main concern is how "crowded" pt's bedroom is and how much they need to move out of it. Stressed that she has a week to make this happen.  Will continue to follow for support and d/c planning.  Bertrum Helmstetter, LCSW

## 2013-12-15 NOTE — Progress Notes (Addendum)
ANTICOAGULATION CONSULT NOTE - Follow Up Consult  Pharmacy Consult for couamdin Indication: DVT  Allergies  Allergen Reactions  . Adhesive [Tape] Other (See Comments)    Tears skin  . Amoxicillin Rash  . Hydrocodone Nausea Only    Patient Measurements: Height: 5' (152.4 cm) Weight: 87 lb 1.3 oz (39.5 kg) IBW/kg (Calculated) : 45.5 Heparin Dosing Weight:   Vital Signs: Temp: 98.8 F (37.1 C) (08/05 0554) Temp src: Oral (08/05 0554) BP: 113/62 mmHg (08/05 0554) Pulse Rate: 90 (08/05 0554)  Labs:  Recent Labs  12/13/13 0553 12/14/13 0700 12/15/13 0535  HGB 11.6*  --  10.8*  HCT 35.2*  --  33.5*  PLT 314  --  309  LABPROT 15.6* 20.7* 21.3*  INR 1.24 1.78* 1.84*    Estimated Creatinine Clearance: 32.6 ml/min (by C-G formula based on Cr of 0.63).   Medications:  Scheduled:  . calcium carbonate  1,250 mg Oral BID WC  . enoxaparin  40 mg Subcutaneous Q12H  . feeding supplement (RESOURCE BREEZE)  1 Container Oral TID BM  . polyethylene glycol  17 g Oral Daily  . Vitamin D (Ergocalciferol)  50,000 Units Oral Q7 days  . Warfarin - Pharmacist Dosing Inpatient   Does not apply q1800   Infusions:    Assessment: [name] is currently on subtherapeutic coumadin bridging with treatment dose of lovenox.  INR is up to 1.84 from 1.79.  Hgb 10.8 and Plt 309 K stable. Day 6 of VTE overlap  Goal of Therapy:  Anti-Xa level 0.6-1 units/ml 4hrs after LMWH dose given; INR 2-3 Monitor platelets by anticoagulation protocol: Yes   Plan:  1. Lovenox 40mg  SQ q12h. 2. CBC q72h while on Lovenox 3. Daily INR 4. Coumadin 3 mg today   Haynes Giannotti, Tsz-Yin 12/15/2013,8:10 AM

## 2013-12-15 NOTE — Progress Notes (Signed)
Occupational Therapy Session Notes  Patient Details  Name: Vanessa Pace MRN: [id number] Date of Birth: 1927/03/05  Today's Date: 12/15/2013  Short Term Goals: Week 1:  OT Short Term Goal 1 (Week 1): Pt. will donn and doff shoulder sling independently with increased time as needed in order to increase I with self care. OT Short Term Goal 2 (Week 1): Pt. will perform bathing, at sink side or EOB, with steady assist only in order to increase I with self care. OT Short Term Goal 3 (Week 1): Pt. will perform UB dressing with Min A in order to increased I with dressing. OT Short Term Goal 4 (Week 1): Pt. will perform LB dressing with min A in order to increase I in dressing skills.  Skilled Therapeutic Interventions/Progress Updates:   Session #1 [phone] - 60 Minutes Individual Therapy No complaints of pain, but some complaints of stomach discomfort Patient received seated in w/c with daughter, Vanessa Pace present. Focused skilled intervention on education to [place] regarding BADL, functional mobility with quad cane, shower stall transfer onto shower seat, UB/LB bathing in sit<>stand position, UB/LB dressing in sit<>stand position, education on use of reacher to increase independence with LB dressing, grooming tasks seated at sink secondary to fatigue, and overall activity tolerance/endurance. At end of session, left patient seated in w/c at sink performing grooming tasks; Vanessa Pace present.   Session #2 1330-1400 - 30 Minutes Individual Therapy No complaints of pain Patient received in BR with daughter. Therapist checked daughter off on safety plan to assist patient <> BR. From here, completed grooming task of washing hands while seated in w/c at sink. Patient then worked on functional ambulation from room > therapy gym; during functional ambulation focused on posture, looking up during mobility, and activity tolerance/endurance. Once in therapy gym, therapist worked with patient on donning/doffing of sling  and PROM > R shoulder. Patient ambulated back to room; focused on same as stated above. At end of session, left patient seated in recliner with all needs within reach and daughter present.   Precautions:  Precautions Precautions: Fall Precaution Comments: HOH Required Braces or Orthoses: Sling Restrictions Weight Bearing Restrictions: Yes RUE Weight Bearing: Partial weight bearing RLE Weight Bearing: Weight bearing as tolerated  See FIM for current functional status  Sweden Lesure 12/15/2013, 7:19 AM

## 2013-12-15 NOTE — Patient Care Conference (Signed)
Inpatient RehabilitationTeam Conference and Plan of Care Update Date: 12/14/2013   Time: 2:10  PM    Patient Name: Vanessa Pace      Medical Record Number: [id number]  Date of Birth: 08-Sep-1926 Sex: Female         Room/Bed: 4W24C/4W24C-01 Payor Info: Payor: MEDICARE / Plan: MEDICARE PART A AND B / Product Type: *No Product type* /    Admitting Diagnosis: R olecranon  troch fx  Admit Date/Time:  12/09/2013  3:02 PM Admission Comments: No comment available   Primary Diagnosis:  <principal problem not specified> Principal Problem: <principal problem not specified>  Patient Active Problem List   Diagnosis Date Noted  . Fall 12/09/2013  . Fracture of olecranon process of right ulna 12/08/2013  . Hip fracture 12/04/2013  . Closed fracture of right ulna 12/04/2013  . Minor head injury without loss of consciousness 12/04/2013  . Leucocytosis 12/04/2013    Expected Discharge Date: Expected Discharge Date: 12/21/13  Team Members Present: Physician leading conference: Dr. Alger Simons Social Worker Present: Lennart Pall, LCSW Nurse Present: Elliot Cousin, RN PT Present: Rada Hay, Elenora Fender, PT OT Present: Willeen Cass, OT;Patricia Lissa Hoard, OT PPS Coordinator present : Daiva Nakayama, RN, CRRN;Becky Alwyn Ren, PT     Current Status/Progress Goal Weekly Team Focus  Medical   right greater troch, olecranon fx. right femoral dvt,   improve activity tolerance,   improve intake, pain control, anticoagulation   Bowel/Bladder   Continent of bowel and bladder  Remain continent of bowel and bladder      Swallow/Nutrition/ Hydration             ADL's   overall min>max assist  overall supervision  ADL retraining, sit<>stands, dynamic standing balance/tolerance/endurance, overall activity tolerance/endurance, family education, d/c planning   Mobility   Min A transfers and gait/ambulation, Mod A w/c mobility,   (S) overall, Min A for car and stairs  ambulation, functional transfers,  stairs, activity tolerance   Communication             Safety/Cognition/ Behavioral Observations  Supervision  remain safe with no falls      Pain   Managing pain with tylenol and oxy IR  Maintain pain level <= 3      Skin   skin intact, ORIF to R shoulder  Remain free of infection and breakdown       Rehab Goals Patient on target to meet rehab goals: Yes *See Care Plan and progress notes for long and short-term goals.  Barriers to Discharge: stamina, pain    Possible Resolutions to Barriers:  family ed, supervision at dc    Discharge Planning/Teaching Needs:  home with 3 children sharing in 24/7 care      Team Discussion:  DVTs found 7/31 with 2 days bedrest.   Some nausea yesterday but none today.  Overall at a min assist level with mobility.  Goals set for supervision.  Daughter remains very involved but anxious.  Family education is ongoing and need to include other daughter and son prior to d/c.  Revisions to Treatment Plan:  None   Continued Need for Acute Rehabilitation Level of Care: The patient requires daily medical management by a physician with specialized training in physical medicine and rehabilitation for the following conditions: Daily direction of a multidisciplinary physical rehabilitation program to ensure safe treatment while eliciting the highest outcome that is of practical value to the patient.: Yes Daily medical management of patient stability for increased activity during  participation in an intensive rehabilitation regime.: Yes Daily analysis of laboratory values and/or radiology reports with any subsequent need for medication adjustment of medical intervention for : Post surgical problems;Neurological problems  Aundra Pung 12/15/2013, 3:22 PM

## 2013-12-15 NOTE — Progress Notes (Signed)
Social Work Patient ID: Vanessa Pace, female   DOB: [birth year], 80 y.o.   MRN: [id number] Lennart Pall, LCSW Social Worker Signed  Patient Care Conference Service date: 12/15/2013 3:22 PM  Inpatient RehabilitationTeam Conference and Plan of Care Update Date: 12/14/2013   Time: 2:10  PM     Patient Name: Vanessa Pace       Medical Record Number: [id number]   Date of Birth: 1927/02/27 Sex: Female         Room/Bed: 4W24C/4W24C-01 Payor Info: Payor: MEDICARE / Plan: MEDICARE PART A AND B / Product Type: *No Product type* /   Admitting Diagnosis: R olecranon  troch fx   Admit Date/Time:  12/09/2013  3:02 PM Admission Comments: No comment available   Primary Diagnosis:  <principal problem not specified> Principal Problem: <principal problem not specified>    Patient Active Problem List     Diagnosis  Date Noted   .  Fall  12/09/2013   .  Fracture of olecranon process of right ulna  12/08/2013   .  Hip fracture  12/04/2013   .  Closed fracture of right ulna  12/04/2013   .  Minor head injury without loss of consciousness  12/04/2013   .  Leucocytosis  12/04/2013     Expected Discharge Date: Expected Discharge Date: 12/21/13  Team Members Present: Physician leading conference: Dr. Alger Simons Social Worker Present: Lennart Pall, LCSW Nurse Present: Elliot Cousin, RN PT Present: Rada Hay, Elenora Fender, PT OT Present: Willeen Cass, OT;Vanessa Lissa Hoard, OT PPS Coordinator present : Daiva Nakayama, RN, CRRN;Becky Alwyn Ren, PT        Current Status/Progress  Goal  Weekly Team Focus   Medical     right greater troch, olecranon fx. right femoral dvt,   improve activity tolerance,   improve intake, pain control, anticoagulation   Bowel/Bladder     Continent of bowel and bladder  Remain continent of bowel and bladder     Swallow/Nutrition/ Hydration            ADL's     overall min>max assist  overall supervision  ADL retraining, sit<>stands, dynamic standing  balance/tolerance/endurance, overall activity tolerance/endurance, family education, d/c planning   Mobility     Min A transfers and gait/ambulation, Mod A w/c mobility,   (S) overall, Min A for car and stairs  ambulation, functional transfers, stairs, activity tolerance   Communication            Safety/Cognition/ Behavioral Observations    Supervision  remain safe with no falls     Pain     Managing pain with tylenol and oxy IR  Maintain pain level <= 3     Skin     skin intact, ORIF to R shoulder  Remain free of infection and breakdown      Rehab Goals Patient on target to meet rehab goals: Yes *See Care Plan and progress notes for long and short-term goals.    Barriers to Discharge:  stamina, pain      Possible Resolutions to Barriers:    family ed, supervision at dc      Discharge Planning/Teaching Needs:    home with 3 children sharing in 24/7 care      Team Discussion:    DVTs found 7/31 with 2 days bedrest.   Some nausea yesterday but none today.  Overall at a min assist level with mobility.  Goals set for supervision.  Daughter remains very involved but anxious.  Family  education is ongoing and need to include other daughter and son prior to d/c.   Revisions to Treatment Plan:    None    Continued Need for Acute Rehabilitation Level of Care: The patient requires daily medical management by a physician with specialized training in physical medicine and rehabilitation for the following conditions: Daily direction of a multidisciplinary physical rehabilitation program to ensure safe treatment while eliciting the highest outcome that is of practical value to the patient.: Yes Daily medical management of patient stability for increased activity during participation in an intensive rehabilitation regime.: Yes Daily analysis of laboratory values and/or radiology reports with any subsequent need for medication adjustment of medical intervention for : Post surgical  problems;Neurological problems  Shaylen Nephew, [employer] 12/15/2013, 3:22 PM         Lennart Pall, LCSW Social Worker Signed  Patient Care Conference Service date: 12/15/2013 3:22 PM  Inpatient RehabilitationTeam Conference and Plan of Care Update Date: 12/14/2013   Time: 2:10  PM     Patient Name: Vanessa Pace       Medical Record Number: [id number]   Date of Birth: 1926/08/05 Sex: Female         Room/Bed: 4W24C/4W24C-01 Payor Info: Payor: MEDICARE / Plan: MEDICARE PART A AND B / Product Type: *No Product type* /   Admitting Diagnosis: R olecranon  troch fx   Admit Date/Time:  12/09/2013  3:02 PM Admission Comments: No comment available   Primary Diagnosis:  <principal problem not specified> Principal Problem: <principal problem not specified>    Patient Active Problem List     Diagnosis  Date Noted   .  Fall  12/09/2013   .  Fracture of olecranon process of right ulna  12/08/2013   .  Hip fracture  12/04/2013   .  Closed fracture of right ulna  12/04/2013   .  Minor head injury without loss of consciousness  12/04/2013   .  Leucocytosis  12/04/2013     Expected Discharge Date: Expected Discharge Date: 12/21/13  Team Members Present: Physician leading conference: Dr. Alger Simons Social Worker Present: Lennart Pall, LCSW Nurse Present: Elliot Cousin, RN PT Present: Rada Hay, Elenora Fender, PT OT Present: Willeen Cass, OT;Vanessa Lissa Hoard, OT PPS Coordinator present : Daiva Nakayama, RN, CRRN;Becky Alwyn Ren, PT        Current Status/Progress  Goal  Weekly Team Focus   Medical     right greater troch, olecranon fx. right femoral dvt,   improve activity tolerance,   improve intake, pain control, anticoagulation   Bowel/Bladder     Continent of bowel and bladder  Remain continent of bowel and bladder     Swallow/Nutrition/ Hydration            ADL's     overall min>max assist  overall supervision  ADL retraining, sit<>stands, dynamic standing balance/tolerance/endurance,  overall activity tolerance/endurance, family education, d/c planning   Mobility     Min A transfers and gait/ambulation, Mod A w/c mobility,   (S) overall, Min A for car and stairs  ambulation, functional transfers, stairs, activity tolerance   Communication            Safety/Cognition/ Behavioral Observations    Supervision  remain safe with no falls     Pain     Managing pain with tylenol and oxy IR  Maintain pain level <= 3     Skin     skin intact, ORIF to R shoulder  Remain free of infection and  breakdown      Rehab Goals Patient on target to meet rehab goals: Yes *See Care Plan and progress notes for long and short-term goals.    Barriers to Discharge:  stamina, pain      Possible Resolutions to Barriers:    family ed, supervision at dc      Discharge Planning/Teaching Needs:    home with 3 children sharing in 24/7 care      Team Discussion:    DVTs found 7/31 with 2 days bedrest.   Some nausea yesterday but none today.  Overall at a min assist level with mobility.  Goals set for supervision.  Daughter remains very involved but anxious.  Family education is ongoing and need to include other daughter and son prior to d/c.   Revisions to Treatment Plan:    None    Continued Need for Acute Rehabilitation Level of Care: The patient requires daily medical management by a physician with specialized training in physical medicine and rehabilitation for the following conditions: Daily direction of a multidisciplinary physical rehabilitation program to ensure safe treatment while eliciting the highest outcome that is of practical value to the patient.: Yes Daily medical management of patient stability for increased activity during participation in an intensive rehabilitation regime.: Yes Daily analysis of laboratory values and/or radiology reports with any subsequent need for medication adjustment of medical intervention for : Post surgical problems;Neurological problems  Genine Beckett,  Payam Gribble 12/15/2013, 3:22 PM

## 2013-12-16 ENCOUNTER — Inpatient Hospital Stay (HOSPITAL_COMMUNITY): Payer: Medicare Other

## 2013-12-16 ENCOUNTER — Inpatient Hospital Stay (HOSPITAL_COMMUNITY): Payer: Medicare Other | Admitting: Occupational Therapy

## 2013-12-16 ENCOUNTER — Encounter (HOSPITAL_COMMUNITY): Payer: Medicare Other | Admitting: Occupational Therapy

## 2013-12-16 LAB — PROTIME-INR
INR: 1.95 — ABNORMAL HIGH (ref 0.00–1.49)
Prothrombin Time: 22.2 seconds — ABNORMAL HIGH (ref 11.6–15.2)

## 2013-12-16 MED ORDER — PANTOPRAZOLE SODIUM 40 MG PO TBEC
40.0000 mg | DELAYED_RELEASE_TABLET | Freq: Every day | ORAL | Status: DC
  Administered 2013-12-16 – 2013-12-21 (×6): 40 mg via ORAL
  Filled 2013-12-16 (×6): qty 1

## 2013-12-16 MED ORDER — WARFARIN SODIUM 3 MG PO TABS
3.0000 mg | ORAL_TABLET | Freq: Once | ORAL | Status: AC
  Administered 2013-12-16: 3 mg via ORAL
  Filled 2013-12-16: qty 1

## 2013-12-16 MED ORDER — DICLOFENAC SODIUM 1 % TD GEL
2.0000 g | Freq: Three times a day (TID) | TRANSDERMAL | Status: DC
  Administered 2013-12-16 – 2013-12-21 (×9): 2 g via TOPICAL
  Filled 2013-12-16: qty 100

## 2013-12-16 NOTE — Progress Notes (Addendum)
Note/chart reviewed.  Katie Jowanna Loeffler, RD, LDN Pager #: [phone] After-Hours Pager #: [phone]  

## 2013-12-16 NOTE — Progress Notes (Signed)
Occupational Therapy Session Notes  Patient Details  Name: Vanessa Pace MRN: [id number] Date of Birth: Aug 13, 1926  Today's Date: 12/16/2013  Short Term Goals: Week 1:  OT Short Term Goal 1 (Week 1): Pt. will donn and doff shoulder sling independently with increased time as needed in order to increase I with self care. OT Short Term Goal 2 (Week 1): Pt. will perform bathing, at sink side or EOB, with steady assist only in order to increase I with self care. OT Short Term Goal 3 (Week 1): Pt. will perform UB dressing with Min A in order to increased I with dressing. OT Short Term Goal 4 (Week 1): Pt. will perform LB dressing with min A in order to increase I in dressing skills.  Skilled Therapeutic Interventions/Progress Updates:   Session #1 [id number] - 60 Minutes Individual Therapy No direct complaints of pain Patient received seated in recliner with complaints of not sleeping well last night due to pain and some nausea this morning. Patient stood with quad cane and ambulated into BR for toilet transfer, toileting, then shower stall transfer. Patient completed UB/LB bathing in sit<>stand position, then ambulated back to room for UB/LB dressing from recliner in sit<>stand position. Patient with complaints of pain in right rib cage during activity, no rate given; therapist assisted through re-positioning. During LB dressing, patient able to pull feet to self for threading BLEs into underwear and pants, no reacher required. Patient donned bilateral socks with set-up, reaching down to feet. Patient then donned sling with min assist. At end of session, left patient seated in recliner with all needs within reach and son present.   Session #2 [id number] - 30 Minutes Individual Therapy No direct complaints of pain Patient received seated in recliner with urgency to use bathroom. Patient stood with quad cane and ambulated into BR for toilet transfer & toileting. Therapist notified RN of successful  urine output and BM. Patient then ambulated > sink for grooming task of washing hands and brushing teeth in standing position. Patient then with request to sit for a rest break, patient sat in recliner. Therapist doffed sling and engaged patient in RUE PROM > shoulder; patient with very minimal pain during this exercise. Therapist donned sling and at end of session, left patient seated in recliner with all needs within reach.   Precautions:  Precautions Precautions: Fall Precaution Comments: HOH Required Braces or Orthoses: Sling Restrictions Weight Bearing Restrictions: Yes RUE Weight Bearing: Partial weight bearing RLE Weight Bearing: Weight bearing as tolerated  See FIM for current functional status  Airanna Partin 12/16/2013, 7:22 AM

## 2013-12-16 NOTE — Progress Notes (Addendum)
Physical Therapy Session Note  Patient Details  Name: Vanessa Pace MRN: [id number] Date of Birth: [birth year]  Today's Date: 12/16/2013 Time: 0800-0900 & [id number] Time Calculation (min): 60 min & 30 min  Short Term Goals: Week 1:  PT Short Term Goal 1 (Week 1): Pt to perform bed mobility with supervision, 50% of time  PT Short Term Goal 2 (Week 1): Pt to perform bed<>chair transfers with superivsion, 50% of time PT Short Term Goal 3 (Week 1): Pt to propel w/c 150' with supervision PT Short Term Goal 4 (Week 1): Pt to ambulate 150' with LRAD and supervision, 50% of time PT Short Term Goal 5 (Week 1): Pt to maintain standing balance during functional tasks w/ LRAD and supervision, 50% of time  Skilled Therapeutic Interventions/Progress Updates:  AM Session (60 min): Pt. Received semi-reclined in bed having completed breakfast. RN present for hand-off to physical therapy for toleting; transfer training. Pt. Agreeable to therapy session. Pt. Supine>sit, >toilet (sit<>stand) with therapist for steady during pericare.  1:1 Treatment focused on functional transfers, balance, gait with SBQC, stairs. Pt. Assist level is min A overall with SBQC and frequent verbal cues due for sequencing and safety. Treatment consisted of ambulating 45 feet, 8" step x 4 with demonstration and min-mod A for task completion. Pt. Had 2 LOBs that required therapist to steady and help regain balance. Pt. Easily distracted and needs frequent verbal cues to complete current task without starting or thinking about starting a different one. Car transfer x4 with pt. Performing teach back; requires occasional verbal cue for sequencing, especially turning with SBQC. Pt. Performed multiple standing activities for balance trials; pt continues to present with a posterior bias that results in short rear stepping that is ataxic-like as pt. Attempts to establish greater BOS and maintain balance.  Pt. Response:  Pain: no c/o pain;  indicates right LE soreness: "this muscle just aches" when placing R hand on distal R femur lateral side.  Pt. Condition post therapy session: positioned in recliner with feet elevated and with all needs within reach.    PM Session (30 min): Pt. Received seated in recliner with blood being drawn by technitian. Pt. Agreeable to therapy session. Indicates she feels fine and "hasn't thrown-up" since lunch. Family friend present in room; exited upon therapy starting.  1:1 Treatment focused on gait/ambulation with minimal cues for pt. Carry-over and increasing functional mobility. Pt. Assist level is min A with SBQC and trial on SPC. Pt. Ambulated distances of 60 feet, 30 feet, 50 feet, and 75 feet with occasional rest/recovery breaks. Pt. Demonstrated proper technique with SPC at min-mod A for balance with occasional verbal/tactile cue > SBQC at min-A with frequent verbal cues for sequencing and balance. Pt. Continues to be internally and externally distracted; soft cues, controlled surfaces, closed environments yield greater carry-over/teach-back. Pt. Requires frequent verbal cues to stay on task and to maintain safety. W/C follow for safety.  Pain: no c/o pain  Pt. Condition post therapy session: positioned seated in recliner with feet elevated with all needs within reach.  Therapy Documentation Precautions:  Precautions Precautions: Fall Precaution Comments: HOH Required Braces or Orthoses: Sling Restrictions Weight Bearing Restrictions: Yes RUE Weight Bearing: Partial weight bearing RLE Weight Bearing: Weight bearing as tolerated General:   Vital Signs:   Pain:   Mobility:   Locomotion : Ambulation Ambulation/Gait Assistance: 4: Min assist Wheelchair Mobility Distance: 15  Trunk/Postural Assessment :    Balance:   Exercises:   Other Treatments:  See FIM for current functional status  Therapy/Group: Individual Therapy  Juluis Mire 12/16/2013, 12:21 PM

## 2013-12-16 NOTE — Progress Notes (Signed)
[facility] PHYSICAL MEDICINE & REHABILITATION     PROGRESS NOTE    Subjective/Complaints: Had some chest pain overnight. Started on left and now it's more on the right side this am. Denies any coughing, sob.   A  review of systems has been performed and if not noted above is otherwise negative.   Objective: Vital Signs: Blood pressure 116/68, pulse 91, temperature 98.3 F (36.8 C), temperature source Oral, resp. rate 18, height 5' (1.524 m), weight 39.599 kg (87 lb 4.8 oz), SpO2 95.00%. No results found.  Recent Labs  12/15/13 0535  WBC 6.1  HGB 10.8*  HCT 33.5*  PLT 309   No results found for this basename: NA, K, CL, CO, GLUCOSE, BUN, CREATININE, CALCIUM,  in the last 72 hours  CBG (last 3)  No results found for this basename: GLUCAP,  in the last 72 hours  Wt Readings from Last 3 Encounters:  12/15/13 39.599 kg (87 lb 4.8 oz)  12/04/13 40.37 kg (89 lb)  12/04/13 40.37 kg (89 lb)    Physical Exam:  Constitutional: She is oriented to person, place, and time. Frail appearing HENT: oral mucosa moist Head: Normocephalic.  Eyes: EOM are normal.  Neck: Normal range of motion. Neck supple. No thyromegaly present.  Cardiovascular: Normal rate and regular rhythm. murmur Respiratory: Effort normal and breath sounds normal. No respiratory distress. No wheezes GI: Soft. Bowel sounds are normal. She exhibits no distension. No pain Neurological: She is alert and oriented to person, place, and time.  Follows full commands. hoh. Skin: Skin is warm and dry. Scattered ecchymoses and bruises Right upper extremity with soft cast and shoulder sling. Appropriately tender.  right hip pain with ROM.  left upper extremity 5/5 in deltoid, bicep, tricep, grip  Right grip is 4 minus she has pain with range of motion of her right fingers  Extremities show osteoarthritic changes bilateral fingers and toes  Right lower extremity 4 minus hip flexion knee extensor ankle dorsi flexion  plantarflexion  Left lower extremity 5/5 hip flexion knee extension ankle dorsiflexion plantar flexion  Psych: pleasant and cooperative Musc: superior chest/ribs tender to palpation and shoulder ROM---palpation accurately reproduced pain   Assessment/Plan: 1. Functional deficits secondary to right olecranon/greater troch fx's which require 3+ hours per day of interdisciplinary therapy in a comprehensive inpatient rehab setting. Physiatrist is providing close team supervision and 24 hour management of active medical problems listed below. Physiatrist and rehab team continue to assess barriers to discharge/monitor patient progress toward functional and medical goals.  FIM: FIM - Bathing Bathing Steps Patient Completed: Chest;Right Arm;Abdomen;Left upper leg;Right upper leg;Buttocks;Front perineal area Bathing: 3: Mod-Patient completes 5-7 82f 10 parts or 50-74%  FIM - Upper Body Dressing/Undressing Upper body dressing/undressing steps patient completed: Thread/unthread right sleeve of pullover shirt/dresss;Thread/unthread left sleeve of pullover shirt/dress;Put head through opening of pull over shirt/dress;Thread/unthread right bra strap;Thread/unthread left bra strap Upper body dressing/undressing: 4: Min-Patient completed 75 plus % of tasks FIM - Lower Body Dressing/Undressing Lower body dressing/undressing steps patient completed: Pull underwear up/down;Pull pants up/down Lower body dressing/undressing: 2: Max-Patient completed 25-49% of tasks  FIM - Toileting Toileting steps completed by patient: Adjust clothing prior to toileting;Performs perineal hygiene;Adjust clothing after toileting Toileting Assistive Devices: Grab bar or rail for support Toileting: 0: Activity did not occur  FIM - Radio producer Devices: Grab bars;Cane Toilet Transfers: 4-From toilet/BSC: Min A (steadying Pt. > 75%)  FIM - Control and instrumentation engineer Devices:  Cane;Arm rests;HOB elevated;Bed  rails [facility]) Bed/Chair Transfer: 4: Chair or W/C > Bed: Min A (steadying Pt. > 75%);4: Bed > Chair or W/C: Min A (steadying Pt. > 75%);5: Supine > Sit: Supervision (verbal cues/safety issues);4: Sit > Supine: Min A (steadying pt. > 75%/lift 1 leg)  FIM - Locomotion: Wheelchair Distance: 25 Locomotion: Wheelchair: 0: Activity did not occur FIM - Locomotion: Ambulation Locomotion: Ambulation Assistive Devices: Nurse, adult Ambulation/Gait Assistance: 4: Min assist Locomotion: Ambulation: 1: Travels less than 50 ft with minimal assistance (Pt.>75%)  Comprehension Comprehension Mode: Auditory Comprehension: 5-Follows basic conversation/direction: With extra time/assistive device  Expression Expression Mode: Verbal Expression: 5-Expresses basic needs/ideas: With extra time/assistive device  Social Interaction Social Interaction: 6-Interacts appropriately with others with medication or extra time (anti-anxiety, antidepressant).  Problem Solving Problem Solving: 5-Solves basic 90% of the time/requires cueing < 10% of the time  Memory Memory: 5-Recognizes or recalls 90% of the time/requires cueing < 10% of the time  Medical Problem List and Plan:  1. Functional deficits secondary to fall sustaining a displaced right olecranon fracture and nondisplaced small right greater trochanteric femur fracture. Status post ORIF olecranon fracture. Nonweightbearing right upper extremity with platform walker weightbearing as tolerated right lower extremity  2. DVT: Right CFV dvt.    -coumadin/lovenox---INR nearly therapeutic     3. Pain Management: Oxycodone as needed.   -voltaren gel to chest wall  4. Constipation. Adjusted bowel program. No bm yet 5. Neuropsych: This patient is capable of making decisions on her own behalf.  6. Skin/Wound Care: Routine skin checks ongoing 7. Nausea/vomiting--resolved    LOS (Days) 7 A FACE TO FACE EVALUATION WAS  PERFORMED  SWARTZ,ZACHARY T 12/16/2013 10:23 AM

## 2013-12-16 NOTE — Progress Notes (Signed)
ANTICOAGULATION CONSULT NOTE - Follow Up Consult  Pharmacy Consult for coumadin and lovenox Indication: DVT  Allergies  Allergen Reactions  . Adhesive [Tape] Other (See Comments)    Tears skin  . Amoxicillin Rash  . Hydrocodone Nausea Only    Patient Measurements: Height: 5' (152.4 cm) Weight: 87 lb 4.8 oz (39.599 kg) IBW/kg (Calculated) : 45.5 Heparin Dosing Weight:   Vital Signs: Temp: 98.3 F (36.8 C) (08/06 0502) Temp src: Oral (08/06 0502) BP: 116/68 mmHg (08/06 0502) Pulse Rate: 91 (08/06 0502)  Labs:  Recent Labs  12/14/13 0700 12/15/13 0535  HGB  --  10.8*  HCT  --  33.5*  PLT  --  309  LABPROT 20.7* 21.3*  INR 1.78* 1.84*    Estimated Creatinine Clearance: 32.7 ml/min (by C-G formula based on Cr of 0.63).   Medications:  Scheduled:  . calcium carbonate  1,250 mg Oral BID WC  . enoxaparin  40 mg Subcutaneous Q12H  . feeding supplement (RESOURCE BREEZE)  1 Container Oral TID BM  . polyethylene glycol  17 g Oral Daily  . Vitamin D (Ergocalciferol)  50,000 Units Oral Q7 days  . Warfarin - Pharmacist Dosing Inpatient   Does not apply q1800   Infusions:    Assessment: [name] is currently on subtherapeutic coumadin bridging with treatment dose of lovenox.  INR today is 1.95.  Day 7 of VTE overlap. Goal of Therapy:  Anti-Xa level 0.6-1 units/ml 4hrs after LMWH dose given; INR 2-3 Monitor platelets by anticoagulation protocol: Yes   Plan:  1. Lovenox 40mg  SQ q12h. 2. CBC q72h while on Lovenox 3. Daily INR 4. Coumadin 3 mg today   Vanessa Pace, Vanessa Pace 12/16/2013,8:07 AM

## 2013-12-17 ENCOUNTER — Inpatient Hospital Stay (HOSPITAL_COMMUNITY): Payer: Medicare Other

## 2013-12-17 ENCOUNTER — Encounter (HOSPITAL_COMMUNITY): Payer: Medicare Other | Admitting: Occupational Therapy

## 2013-12-17 ENCOUNTER — Inpatient Hospital Stay (HOSPITAL_COMMUNITY): Payer: Medicare Other | Admitting: Physical Therapy

## 2013-12-17 LAB — PROTIME-INR
INR: 2.53 — AB (ref 0.00–1.49)
PROTHROMBIN TIME: 27.3 s — AB (ref 11.6–15.2)

## 2013-12-17 MED ORDER — WARFARIN SODIUM 1 MG PO TABS
1.5000 mg | ORAL_TABLET | Freq: Once | ORAL | Status: AC
  Administered 2013-12-17: 1.5 mg via ORAL
  Filled 2013-12-17 (×2): qty 1

## 2013-12-17 MED ORDER — WARFARIN SODIUM 3 MG PO TABS
3.0000 mg | ORAL_TABLET | Freq: Once | ORAL | Status: DC
  Filled 2013-12-17: qty 1

## 2013-12-17 NOTE — Plan of Care (Signed)
Problem: RH BLADDER ELIMINATION Goal: RH STG MANAGE BLADDER WITH ASSISTANCE STG Manage Bladder With Assistance, Mod I Outcome: Progressing No incontinent episodes

## 2013-12-17 NOTE — Progress Notes (Signed)
Physical Therapy Weekly Progress Note  Patient Details  Name: Vanessa Pace MRN: [id number] Date of Birth: [birth year]  Beginning of progress report period: December 10, 2013 End of progress report period: December 17, 2013  Today's Date: 12/17/2013 Time: 6754-4920 Time Calculation (min): 35 min  Patient has made steady progress and has met 5 of 5 short term goals.  Pt is currently supervision-min A overall for bed mobility, basic transfers, gait, and stair negotiation.  Vestibular evaluation completed with patient secondary to c/o dizziness, nausea and vomiting with functional movements.  Patient continues to demonstrate the following deficits: decreased strength, impaired activity tolerance/endurance, impaired balance, gait, vestibular impairments, impaired cognition and therefore will continue to benefit from skilled PT intervention to enhance overall performance with activity tolerance, balance and attention.  Patient progressing toward long term goals..  Continue plan of care.  PT Short Term Goals Week 1:  PT Short Term Goal 1 (Week 1): Pt to perform bed mobility with supervision, 50% of time  PT Short Term Goal 1 - Progress (Week 1): Met PT Short Term Goal 2 (Week 1): Pt to perform bed<>chair transfers with superivsion, 50% of time PT Short Term Goal 2 - Progress (Week 1): Met PT Short Term Goal 3 (Week 1): Pt to propel w/c 150' with supervision PT Short Term Goal 3 - Progress (Week 1): Discontinued (comment) (not appropriate secondary to UE WB status) PT Short Term Goal 4 (Week 1): Pt to ambulate 150' with LRAD and supervision, 50% of time PT Short Term Goal 4 - Progress (Week 1): Met PT Short Term Goal 5 (Week 1): Pt to maintain standing balance during functional tasks w/ LRAD and supervision, 50% of time PT Short Term Goal 5 - Progress (Week 1): Met Week 2:  PT Short Term Goal 1 (Week 2): = LTG of supervision overall   Skilled Therapeutic Interventions/Progress Updates:   Pt received  in room standing at sink with OT and daughter.  Discussed with daughter and pt goals, pt progress towards goals, D/C plan and equipment needs.  Daughter feels that pt will require a w/c for longer distances in the community.  Discussed use of transport w/c secondary to pt not propelling w/c and ease of transport.  Daughter would like to order transport w/c.  Pt still unsure about which cane (SBQC vs SPC) she feels more balanced using; Pt and daughter to discuss further and let PTA know this pm.  Pt performed transfer recliner > w/c min HHA.  Transported to smaller gym total A.  Transferred w/c > mat supervision with SPC.  Demonstrated to daughter how to fold/unfold w/c; daughter have repeat demonstration.  Performed simulated car transfer ambulating with SPC and supervision.  Pt performed transfer in/out of car supervision.  Returned to room and pt returned to recliner supervision.  Will continue to assess for equipment and perform family education.  Therapy Documentation Precautions:  Precautions Precautions: Fall Precaution Comments: HOH Required Braces or Orthoses: Sling Restrictions Weight Bearing Restrictions: Yes RUE Weight Bearing: Partial weight bearing RLE Weight Bearing: Weight bearing as tolerated Pain: Pain Assessment Pain Assessment: No/denies pain Locomotion : Ambulation Ambulation/Gait Assistance: 4: Min guard   See FIM for current functional status  Therapy/Group: Individual Therapy  Raylene Everts [facility] 12/17/2013, 12:34 PM

## 2013-12-17 NOTE — Progress Notes (Signed)
Occupational Therapy Weekly Progress Note & Session Note  Patient Details  Name: Vanessa Pace MRN: [id number] Date of Birth: 09-26-1926  Beginning of progress report period: December 09, 2013 End of progress report period: December 17, 2013  Today's Date: 12/17/2013  Patient has met 4 of 4 short term goals.  Patient is making good progress on CIR. Patient continues to be limited by increased anxiety. Patient also seems to be internally distracted during therapy session and requires frequent re-direction and cueing. Patient's daughter, Vanessa Pace has been present during most OT sessions and education to Vanessa Pace has started and is in progress. Vanessa Pace seems anxious about taking patient home and requires multiple family education sessions. Plan is for patient to discharge Tuesday, August 11th with family providing 24/7 supervision.   Patient continues to demonstrate the following deficits: increased anxiety, decreased independence with BADLs/IADLs, decreased independence with functional mobility/transfers, decreased overall activity tolerance/endurance. Therefore, patient will continue to benefit from skilled OT intervention to enhance overall performance with BADL, iADL and Reduce care partner burden.  Patient progressing toward long term goals..  Continue plan of care.  OT Short Term Goals Week 1:  OT Short Term Goal 1 (Week 1): Pt. will donn and doff shoulder sling independently with increased time as needed in order to increase I with self care. OT Short Term Goal 1 - Progress (Week 1): Met OT Short Term Goal 2 (Week 1): Pt. will perform bathing, at sink side or EOB, with steady assist only in order to increase I with self care. OT Short Term Goal 2 - Progress (Week 1): Met OT Short Term Goal 3 (Week 1): Pt. will perform UB dressing with Min A in order to increased I with dressing. OT Short Term Goal 3 - Progress (Week 1): Met OT Short Term Goal 4 (Week 1): Pt. will perform LB dressing with min A in order to  increase I in dressing skills. OT Short Term Goal 4 - Progress (Week 1): Met  Week 2:  OT Short Term Goal 1 (Week 2): Short Term Goals = Long Term Goals  Skilled Therapeutic Interventions/Progress Updates:  Balance/vestibular training;Discharge planning;Functional mobility training;Pain management;Psychosocial support;Therapeutic Activities;Therapeutic Exercise;Self Care/advanced ADL retraining;Patient/family education   Precautions:  Precautions Precautions: Fall Precaution Comments: HOH Required Braces or Orthoses: Sling Restrictions Weight Bearing Restrictions: Yes RUE Weight Bearing: Partial weight bearing RLE Weight Bearing: Weight bearing as tolerated  See FIM for current functional status  --------------------------------------------------------------------------------------------------------------------------------  SESSION NOTE [id number] - 60 Minutes Individual Therapy No complaints of pain Patient received seated in recliner with daughter and RN present. Focused skilled intervention on education, demonstration, & return demonstration > patient's daughter regarding functional mobility using quad cane, fuctional transfers, ADL retraining at shower level, UB/LB dressing, appropriate cueing for tasks, and energy conservation techniques. This therapist continues to encourage Vanessa Pace to let patient be as independent as possible and importance of patience. Patient is overall min assist > supervision with extra time. Patient ambulated > sink for grooming tasks in standing position. At end of session, left patient standing at sink with daughter and PT present for next session.   Vanessa Pace 12/17/2013, 9:06 AM

## 2013-12-17 NOTE — Progress Notes (Signed)
[facility] PHYSICAL MEDICINE & REHABILITATION     PROGRESS NOTE    Subjective/Complaints: Still sore, but otherwise doing well. Moved bowels.   A  review of systems has been performed and if not noted above is otherwise negative.   Objective: Vital Signs: Blood pressure 119/79, pulse 85, temperature 97.8 F (36.6 C), temperature source Oral, resp. rate 18, height 5' (1.524 m), weight 39.599 kg (87 lb 4.8 oz), SpO2 100.00%. No results found.  Recent Labs  12/15/13 0535  WBC 6.1  HGB 10.8*  HCT 33.5*  PLT 309   No results found for this basename: NA, K, CL, CO, GLUCOSE, BUN, CREATININE, CALCIUM,  in the last 72 hours  CBG (last 3)  No results found for this basename: GLUCAP,  in the last 72 hours  Wt Readings from Last 3 Encounters:  12/15/13 39.599 kg (87 lb 4.8 oz)  12/04/13 40.37 kg (89 lb)  12/04/13 40.37 kg (89 lb)    Physical Exam:  Constitutional: She is oriented to person, place, and time. Frail appearing HENT: oral mucosa moist Head: Normocephalic.  Eyes: EOM are normal.  Neck: Normal range of motion. Neck supple. No thyromegaly present.  Cardiovascular: Normal rate and regular rhythm. murmur Respiratory: Effort normal and breath sounds normal. No respiratory distress. No wheezes GI: Soft. Bowel sounds are normal. She exhibits no distension. No pain Neurological: She is alert and oriented to person, place, and time.  Follows full commands. hoh. Skin: Skin is warm and dry. Scattered ecchymoses and bruises Right upper extremity with soft cast and shoulder sling. Appropriately tender.  right hip pain with ROM.  left upper extremity 5/5 in deltoid, bicep, tricep, grip  Right grip is 4 minus she has pain with range of motion of her right fingers  Extremities show osteoarthritic changes bilateral fingers and toes  Right lower extremity 4 minus hip flexion knee extensor ankle dorsi flexion plantarflexion  Left lower extremity 5/5 hip flexion knee extension ankle  dorsiflexion plantar flexion  Psych: pleasant and cooperative Musc: superior chest/ribs tender to palpation and shoulder ROM---palpation accurately reproduced pain   Assessment/Plan: 1. Functional deficits secondary to right olecranon/greater troch fx's which require 3+ hours per day of interdisciplinary therapy in a comprehensive inpatient rehab setting. Physiatrist is providing close team supervision and 24 hour management of active medical problems listed below. Physiatrist and rehab team continue to assess barriers to discharge/monitor patient progress toward functional and medical goals.  FIM: FIM - Bathing Bathing Steps Patient Completed: Chest;Right Arm;Abdomen;Left upper leg;Right upper leg;Buttocks;Front perineal area;Left Arm;Right lower leg (including foot);Left lower leg (including foot) Bathing: 5: Supervision: Safety issues/verbal cues  FIM - Upper Body Dressing/Undressing Upper body dressing/undressing steps patient completed: Thread/unthread right sleeve of pullover shirt/dresss;Thread/unthread left sleeve of pullover shirt/dress;Put head through opening of pull over shirt/dress;Thread/unthread right bra strap;Thread/unthread left bra strap Upper body dressing/undressing: 4: Min-Patient completed 75 plus % of tasks FIM - Lower Body Dressing/Undressing Lower body dressing/undressing steps patient completed: Pull underwear up/down;Pull pants up/down;Thread/unthread right underwear leg;Thread/unthread left underwear leg;Thread/unthread right pants leg;Thread/unthread left pants leg;Don/Doff right sock;Don/Doff left sock Lower body dressing/undressing: 5: Supervision: Safety issues/verbal cues  FIM - Toileting Toileting steps completed by patient: Performs perineal hygiene Toileting Assistive Devices: Grab bar or rail for support Toileting: 0: Activity did not occur  FIM - Radio producer Devices: Cane;Grab bars Toilet Transfers: 0-Activity did not  occur  FIM - Control and instrumentation engineer Devices: Walker;Cane;Bed Therapist, music Transfer: 4: Chair or W/C >  Bed: Min A (steadying Pt. > 75%);4: Bed > Chair or W/C: Min A (steadying Pt. > 75%);5: Supine > Sit: Supervision (verbal cues/safety issues);4: Sit > Supine: Min A (steadying pt. > 75%/lift 1 leg)  FIM - Locomotion: Wheelchair Distance: 15 Locomotion: Wheelchair: 1: Travels less than 50 ft with moderate assistance (Pt: 50 - 74%) FIM - Locomotion: Ambulation Locomotion: Ambulation Assistive Devices: Cane - Quad;Cane - Straight Ambulation/Gait Assistance: 4: Min assist Locomotion: Ambulation: 2: Travels 50 - 149 ft with supervision/safety issues  Comprehension Comprehension Mode: Auditory Comprehension: 5-Follows basic conversation/direction: With extra time/assistive device  Expression Expression Mode: Verbal Expression: 5-Expresses basic needs/ideas: With extra time/assistive device  Social Interaction Social Interaction: 6-Interacts appropriately with others with medication or extra time (anti-anxiety, antidepressant).  Problem Solving Problem Solving: 5-Solves basic 90% of the time/requires cueing < 10% of the time  Memory Memory: 5-Recognizes or recalls 90% of the time/requires cueing < 10% of the time  Medical Problem List and Plan:  1. Functional deficits secondary to fall sustaining a displaced right olecranon fracture and nondisplaced small right greater trochanteric femur fracture. Status post ORIF olecranon fracture. Nonweightbearing right upper extremity with platform walker weightbearing as tolerated right lower extremity  2. DVT: Right CFV dvt.    -coumadin/lovenox---INR therapeutic---dc lovenox       3. Pain Management: Oxycodone as needed.   -voltaren gel to chest wall  4. Constipation. Adjusted bowel program. Two bm's yesterday 5. Neuropsych: This patient is capable of making decisions on her own behalf.  6. Skin/Wound Care: Routine  skin checks ongoing 7. Nausea/vomiting--resolved    LOS (Days) 8 A FACE TO FACE EVALUATION WAS PERFORMED  SWARTZ,ZACHARY T 12/17/2013 10:18 AM

## 2013-12-17 NOTE — Progress Notes (Signed)
Physical Therapy Session Note  Patient Details  Name: Vanessa Pace MRN: [id number] Date of Birth: 20-Feb-1927  Today's Date: 12/17/2013 Time: 0800-0830 Time Calculation (min): 30 min & 60 min  Short Term Goals: Week 1:  PT Short Term Goal 1 (Week 1): Pt to perform bed mobility with supervision, 50% of time  PT Short Term Goal 2 (Week 1): Pt to perform bed<>chair transfers with superivsion, 50% of time PT Short Term Goal 3 (Week 1): Pt to propel w/c 150' with supervision PT Short Term Goal 4 (Week 1): Pt to ambulate 150' with LRAD and supervision, 50% of time PT Short Term Goal 5 (Week 1): Pt to maintain standing balance during functional tasks w/ LRAD and supervision, 50% of time  Skilled Therapeutic Interventions/Progress Updates:  AM Session (30 min): Pt. Received seated in recliner and agreeable to therapy session.  1:1 Treatment focused on gait/ambulation with SBQC and SPC, and stairs. Pt. Assist level is min guard assist with ADs. Treatment consisted of 5 steps x 2 up/down with min guard assist for safety. Pt. Ambulated 203 feet with SBQC, 50 feet with SPC with verbal and tactile cues for safety an proper sequencing.  Pain: see below R LE proximal to knee lateral side; described as muscle "catching". Unrated pain/discomfort.  Pt. Condition post therapy session: positioned seated in recliner with all needs within reach. RN present for scheduled medication.   PM Session (60 min): Pt. Received seated in recliner with daughter present. Agreeable to physical therapy.  2:1 (daughter present for additional family education) Treatment focused on ambulation with [facility] with family education component for increased safety . Pt. Assist level is min guard assist with SPC. Treatment consisted of ambulating 160 feet with daughter providing verbal and tactile cues with therapist providing education and verbal cues to daughter and patient for safety and technique. Pt. Performed several sustained  >5 min static standing; multiple dynamic standing trials and function transfers from standing to sitting with no UE support. Pt. Tolerated increased static balance trials and displays increased ability to minimized external distractions with daughter present and providing minimal cues; coached daughter to use less words and let patient figure things out. Pt. Ambulated additional 160 feet with daughter guarding with w/c follow for safety due to increased fatigue. Pt. Required one min A assist due to step clearance and LOB that was corrected by daughter assisting at hip level from posterior position.   Pain: no c/o pain  Pt. Condition post therapy session: positioned seated in recliner with all needs within reach. Daughter present.  Therapy Documentation Precautions:  Precautions Precautions: Fall Precaution Comments: HOH Required Braces or Orthoses: Sling Restrictions Weight Bearing Restrictions: Yes RUE Weight Bearing: Partial weight bearing RLE Weight Bearing: Weight bearing as tolerated  Pain: Pain Assessment Pain Assessment: 0-10 Pain Score: 3  Pain Type: Acute pain Pain Location: Leg Pain Orientation: Right Pain Descriptors / Indicators: Sharp Pain Onset: With Activity Pain Intervention(s): Repositioned;Relaxation  Locomotion : Ambulation Ambulation/Gait Assistance: 4: Min guard   See FIM for current functional status  Therapy/Group: Individual Therapy  Juluis Mire 12/17/2013, 10:48 AM

## 2013-12-17 NOTE — Progress Notes (Addendum)
ANTICOAGULATION CONSULT NOTE - Follow Up Consult  Pharmacy Consult for coumadin and lovenox Indication: DVT  Allergies  Allergen Reactions  . Adhesive [Tape] Other (See Comments)    Tears skin  . Amoxicillin Rash  . Hydrocodone Nausea Only    Patient Measurements: Height: 5' (152.4 cm) Weight: 87 lb 4.8 oz (39.599 kg) IBW/kg (Calculated) : 45.5 Heparin Dosing Weight:   Vital Signs: Temp: 97.8 F (36.6 C) (08/07 0501) Temp src: Oral (08/07 0501) BP: 119/79 mmHg (08/07 0501) Pulse Rate: 85 (08/07 0501)  Labs:  Recent Labs  12/15/13 0535 12/16/13 0802 12/17/13 0420  HGB 10.8*  --   --   HCT 33.5*  --   --   PLT 309  --   --   LABPROT 21.3* 22.2* 27.3*  INR 1.84* 1.95* 2.53*    Estimated Creatinine Clearance: 32.7 ml/min (by C-G formula based on Cr of 0.63).   Medications:  Scheduled:  . calcium carbonate  1,250 mg Oral BID WC  . diclofenac sodium  2 g Topical TID  . enoxaparin  40 mg Subcutaneous Q12H  . feeding supplement (RESOURCE BREEZE)  1 Container Oral TID BM  . pantoprazole  40 mg Oral Daily  . polyethylene glycol  17 g Oral Daily  . Vitamin D (Ergocalciferol)  50,000 Units Oral Q7 days  . Warfarin - Pharmacist Dosing Inpatient   Does not apply q1800   Infusions:    Assessment: 80 yo female with DVT is currently on therapeutic coumadin.  INR today is 2.53.  Patient is also on treatment dose of lovenox. Goal of Therapy:  Anti-Xa level 0.6-1 units/ml 4hrs after LMWH dose given; INR 2-3 Monitor platelets by anticoagulation protocol: Yes   Plan:  1) d/c lovenox 2) Coumadin 1.5 mg po x1 3) INR in am  Fountain Derusha, Tsz-Yin 12/17/2013,8:21 AM

## 2013-12-18 ENCOUNTER — Encounter (HOSPITAL_COMMUNITY): Payer: Medicare Other | Admitting: Occupational Therapy

## 2013-12-18 ENCOUNTER — Inpatient Hospital Stay (HOSPITAL_COMMUNITY): Payer: Medicare Other | Admitting: Physical Therapy

## 2013-12-18 DIAGNOSIS — Z5189 Encounter for other specified aftercare: Principal | ICD-10-CM

## 2013-12-18 DIAGNOSIS — S72009B Fracture of unspecified part of neck of unspecified femur, initial encounter for open fracture type I or II: Secondary | ICD-10-CM

## 2013-12-18 LAB — PROTIME-INR
INR: 2.39 — AB (ref 0.00–1.49)
Prothrombin Time: 26.1 seconds — ABNORMAL HIGH (ref 11.6–15.2)

## 2013-12-18 MED ORDER — WARFARIN SODIUM 2.5 MG PO TABS
2.5000 mg | ORAL_TABLET | Freq: Once | ORAL | Status: AC
  Administered 2013-12-18: 2.5 mg via ORAL
  Filled 2013-12-18: qty 1

## 2013-12-18 NOTE — Progress Notes (Signed)
Vanessa Pace is a 80 y.o. female 1927-01-11 [id number]  Subjective: No new complaints. No new problems. "Did they get my lunch order?" Slept well. Feeling OK.  Objective: Vital signs in last 24 hours: Temp:  [97.9 F (36.6 C)-98.4 F (36.9 C)] 98 F (36.7 C) (08/08 0518) Pulse Rate:  [85-88] 85 (08/08 0518) Resp:  [17-18] 18 (08/08 0518) BP: (119-126)/(68-76) 119/76 mmHg (08/08 0518) SpO2:  [95 %-99 %] 95 % (08/08 0518) Weight change:  Last BM Date: 12/17/13  Intake/Output from previous day: 08/07 0701 - 08/08 0700 In: 960 [P.O.:960] Out: -   Physical Exam General: No apparent distress   In BSC, determining lunch menu choice Lungs: Normal effort. Lungs clear to auscultation, no crackles or wheezes. Cardiovascular: Regular rate and rhythm, no edema  Lab Results: BMET    Component Value Date/Time   NA 136* 12/10/2013 0725   K 4.4 12/10/2013 0725   CL 96 12/10/2013 0725   CO2 28 12/10/2013 0725   GLUCOSE 96 12/10/2013 0725   BUN 13 12/10/2013 0725   CREATININE 0.63 12/10/2013 0725   CALCIUM 9.3 12/10/2013 0725   GFRNONAA 80* 12/10/2013 0725   GFRAA >90 12/10/2013 0725   CBC    Component Value Date/Time   WBC 6.1 12/15/2013 0535   RBC 3.56* 12/15/2013 0535   HGB 10.8* 12/15/2013 0535   HCT 33.5* 12/15/2013 0535   PLT 309 12/15/2013 0535   MCV 94.1 12/15/2013 0535   MCH 30.3 12/15/2013 0535   MCHC 32.2 12/15/2013 0535   RDW 12.9 12/15/2013 0535   LYMPHSABS 1.9 12/10/2013 0725   MONOABS 1.1* 12/10/2013 0725   EOSABS 0.2 12/10/2013 0725   BASOSABS 0.0 12/10/2013 0725   CBG's (last 3):  No results found for this basename: GLUCAP,  in the last 72 hours LFT's Lab Results  Component Value Date   ALT 15 12/10/2013   AST 24 12/10/2013   ALKPHOS 105 12/10/2013   BILITOT 0.4 12/10/2013    Studies/Results: No results found.  Medications:  I have reviewed the patient's current medications. Scheduled Medications: . calcium carbonate  1,250 mg Oral BID WC  . diclofenac sodium  2 g Topical  TID  . feeding supplement (RESOURCE BREEZE)  1 Container Oral TID BM  . pantoprazole  40 mg Oral Daily  . polyethylene glycol  17 g Oral Daily  . Vitamin D (Ergocalciferol)  50,000 Units Oral Q7 days  . Warfarin - Pharmacist Dosing Inpatient   Does not apply q1800   PRN Medications: acetaminophen, ondansetron (ZOFRAN) IV, ondansetron, oxyCODONE, sorbitol, traZODone  Assessment/Plan: Active Problems:   Fall 1. Functional deficits secondary to fall sustaining a displaced right olecranon fracture and nondisplaced small right greater trochanteric femur fracture. Status post ORIF olecranon fracture. Nonweightbearing right upper extremity with platform walker weightbearing as tolerated right lower extremity  2. DVT: Right CFV dvt.  -coumadin per pharmacy protocol---INR therapeutic-  3. Pain Management: Oxycodone as needed.  -voltaren gel to chest wall  4. Constipation. Adjusted bowel program. Two bm's yesterday  5. Neuropsych: This patient is capable of making decisions on her own behalf.  6. Skin/Wound Care: Routine skin checks ongoing     Length of stay, days: 9    Kennett Symes A. Asa Lente, MD 12/18/2013, 9:05 AM

## 2013-12-18 NOTE — Progress Notes (Signed)
Occupational Therapy Session Note  Patient Details  Name: Vanessa Pace MRN: [id number] Date of Birth: 26-Apr-1927  Today's Date: 12/18/2013 Time: 1000-1100 Time Calculation (min): 60 min  Short Term Goals: Week 2:  OT Short Term Goal 1 (Week 2): Short Term Goals = Long Term Goals  Skilled Therapeutic Interventions/Progress Updates:    Pt with no c/o pain during session. Her two daughters, primary caregivers, were present for family education during ADL session. OT session with focus on ADL retraining, patient and family education, functional mobility, functional transfers, and dynamic standing balance. Therapist directed and provided example of providing close supervision for pt with family member demonstrating understanding. Arbie Cookey, her daughter, required verbal cues initially to allow pt to perform tasks independently with supervision only. OT discussed patients currently level of assist as well as goals with caregivers in order for them to understand what patient is capable of at this point in rehabilitation. Pt ambulates with single point cane and supervision. Toilet and tub bench transfer with Min A to steady patient's balance during functional transfer. Pt performed dressing tasks seated on the edge of the bed and standing for LB clothing management. Pt demonstrated the ability to doff shoulder sling independently but required verbal cues and additional demonstrations in order to donn sling. Pt making progress towards goals. Caregivers reporting, "Wow, I did not realize that she was able to do so much with just supervision." Family and patient education to continue.   Therapy Documentation Precautions:  Precautions Precautions: Fall Precaution Comments: HOH Required Braces or Orthoses: Sling Restrictions Weight Bearing Restrictions: Yes RUE Weight Bearing: Partial weight bearing RLE Weight Bearing: Weight bearing as tolerated  See FIM for current functional status  Therapy/Group:  Individual Therapy  Phineas Semen 12/18/2013, 12:51 PM

## 2013-12-18 NOTE — Progress Notes (Signed)
ANTICOAGULATION CONSULT NOTE - Follow Up Consult  Pharmacy Consult for coumadin Indication: DVT  Allergies  Allergen Reactions  . Adhesive [Tape] Other (See Comments)    Tears skin  . Amoxicillin Rash  . Hydrocodone Nausea Only    Patient Measurements: Height: 5' (152.4 cm) Weight: 87 lb 4.8 oz (39.599 kg) IBW/kg (Calculated) : 45.5 Heparin Dosing Weight:   Vital Signs: Temp: 98 F (36.7 C) (08/08 0518) Temp src: Oral (08/08 0518) BP: 119/76 mmHg (08/08 0518) Pulse Rate: 85 (08/08 0518)  Labs:  Recent Labs  12/16/13 0802 12/17/13 0420 12/18/13 0625  LABPROT 22.2* 27.3* 26.1*  INR 1.95* 2.53* 2.39*    Estimated Creatinine Clearance: 32.7 ml/min (by C-G formula based on Cr of 0.63).   Medications:  Scheduled:  . calcium carbonate  1,250 mg Oral BID WC  . diclofenac sodium  2 g Topical TID  . feeding supplement (RESOURCE BREEZE)  1 Container Oral TID BM  . pantoprazole  40 mg Oral Daily  . polyethylene glycol  17 g Oral Daily  . Vitamin D (Ergocalciferol)  50,000 Units Oral Q7 days  . Warfarin - Pharmacist Dosing Inpatient   Does not apply q1800   Infusions:    Assessment: 80 year old female with DVT who is therapeutic on Coumadin.  Her INR has been relatively stable x 2 days and she has received an average of ~2.5mg  of Coumadin/day this admission.  Goal of Therapy:  INR 2-3   Plan:  Coumadin 2.5mg  today Daily PT/INR  Legrand Como, Pharm.D., BCPS, AAHIVP Clinical Pharmacist Phone: [phone] or [phone] 12/18/2013, 12:07 PM

## 2013-12-18 NOTE — Progress Notes (Signed)
Physical Therapy Session Note  Patient Details  Name: Vanessa Pace MRN: [id number] Date of Birth: 03/10/1927  Today's Date: 12/18/2013 Time: 1105-1205 Time Calculation (min): 60 min  Short Term Goals: Week 1:  PT Short Term Goal 1 (Week 1): Pt to perform bed mobility with supervision, 50% of time  PT Short Term Goal 1 - Progress (Week 1): Met PT Short Term Goal 2 (Week 1): Pt to perform bed<>chair transfers with superivsion, 50% of time PT Short Term Goal 2 - Progress (Week 1): Met PT Short Term Goal 3 (Week 1): Pt to propel w/c 150' with supervision PT Short Term Goal 3 - Progress (Week 1): Discontinued (comment) (not appropriate secondary to UE WB status) PT Short Term Goal 4 (Week 1): Pt to ambulate 150' with LRAD and supervision, 50% of time PT Short Term Goal 4 - Progress (Week 1): Met PT Short Term Goal 5 (Week 1): Pt to maintain standing balance during functional tasks w/ LRAD and supervision, 50% of time PT Short Term Goal 5 - Progress (Week 1): Met  Skilled Therapeutic Interventions/Progress Updates:  Pt was seen bedside in the am for family training. Pt performed transfers edge of bed to supine, supine to edge of bed with S and verbal cues for technique. Performed family training with Vanessa Pace, pt's daughter. Pt's daughter educated on technique for providing min guard assistance. Pt performed multiple transfers with cane and arm rests with S and verbal cues for safety. Pt ambulated distances of 15, 200, 150 feet with st cane and min guard with occasional verbal cues for safety. Pt performed car transfers with S to min guard and verbal cues for safety. Pt ascended/descended 4 stairs x 2 with st cane and min A with verbal cues. Pt's daughter demonstrated proper guarding technique for transfers, ambulation and stairs with only occasional verbal cues. Pt's daughter Vanessa Pace is able to provide appropriate assistance for transfers, ambulation, and stairs at this time.    Therapy  Documentation Precautions:  Precautions Precautions: Fall Precaution Comments: HOH Required Braces or Orthoses: Sling Restrictions Weight Bearing Restrictions: Yes RUE Weight Bearing: Partial weight bearing RLE Weight Bearing: Weight bearing as tolerated General:   Vital Signs:   Pain:   Mobility:   Locomotion : Ambulation Ambulation/Gait Assistance: 4: Min guard  Trunk/Postural Assessment :    Balance:   Exercises:   Other Treatments:    See FIM for current functional status  Therapy/Group: Individual Therapy  Dub Amis 12/18/2013, 12:32 PM

## 2013-12-19 ENCOUNTER — Inpatient Hospital Stay (HOSPITAL_COMMUNITY): Payer: Medicare Other | Admitting: Occupational Therapy

## 2013-12-19 LAB — PROTIME-INR
INR: 2.32 — ABNORMAL HIGH (ref 0.00–1.49)
PROTHROMBIN TIME: 25.5 s — AB (ref 11.6–15.2)

## 2013-12-19 MED ORDER — WARFARIN SODIUM 2.5 MG PO TABS
2.5000 mg | ORAL_TABLET | Freq: Once | ORAL | Status: AC
  Administered 2013-12-19: 2.5 mg via ORAL
  Filled 2013-12-19: qty 1

## 2013-12-19 NOTE — Progress Notes (Signed)
Occupational Therapy Session Note  Patient Details  Name: Vanessa Pace MRN: [id number] Date of Birth: 1927/05/03  Today's Date: 12/19/2013 Time: 1126-1208 Time Calculation (min): 42 min   Skilled Therapeutic Interventions/Progress Updates: Patient seen for toileting with close S and toileting transfer via straight cane and grab bar.  Seen for functional ambulation for self care and home skills while using her straight cane.  Due to right hand arthritis, she was unable to grip and turn bathroom door knob but was able to with her left hand.    As well, she was able to doff her sling with cues and elected to leave it off for a break and stated, "I have trouble getting this darn thing don right."  Then her lunch tray came, and she wanted to stop therapy to eat (It came at the end of the session anyway).  She was encouraged to practice donning the sling for increased independence.      Therapy Documentation Precautions:  Precautions Precautions: Fall Precaution Comments: HOH Required Braces or Orthoses: Sling Restrictions Weight Bearing Restrictions: Yes RUE Weight Bearing: Partial weight bearing RLE Weight Bearing: Weight bearing as tolerated  Paindenied    See FIM for current functional status  Therapy/Group: Individual Therapy  Herschell Dimes 12/19/2013, 3:43 PM

## 2013-12-19 NOTE — Progress Notes (Signed)
Vanessa Pace is a 80 y.o. female 07/31/1926 [id number]  Subjective: Looking forward to shower and shampoo. Denies pain or new problems. Slept very well.  Objective: Vital signs in last 24 hours: Temp:  [98 F (36.7 C)] 98 F (36.7 C) (08/09 0526) Pulse Rate:  [88-100] 88 (08/09 0526) Resp:  [16-18] 18 (08/09 0526) BP: (112-127)/(62-82) 113/62 mmHg (08/09 0526) SpO2:  [97 %] 97 % (08/09 0526) Weight change:  Last BM Date: 12/18/13  Intake/Output from previous day: 08/08 0701 - 08/09 0700 In: 960 [P.O.:960] Out: -   Physical Exam General: No apparent distress   Sitting upright in bed Lungs: Normal effort. Lungs clear to auscultation, no crackles or wheezes. Cardiovascular: Regular rate and rhythm, no edema  Lab Results: BMET    Component Value Date/Time   NA 136* 12/10/2013 0725   K 4.4 12/10/2013 0725   CL 96 12/10/2013 0725   CO2 28 12/10/2013 0725   GLUCOSE 96 12/10/2013 0725   BUN 13 12/10/2013 0725   CREATININE 0.63 12/10/2013 0725   CALCIUM 9.3 12/10/2013 0725   GFRNONAA 80* 12/10/2013 0725   GFRAA >90 12/10/2013 0725   CBC    Component Value Date/Time   WBC 6.1 12/15/2013 0535   RBC 3.56* 12/15/2013 0535   HGB 10.8* 12/15/2013 0535   HCT 33.5* 12/15/2013 0535   PLT 309 12/15/2013 0535   MCV 94.1 12/15/2013 0535   MCH 30.3 12/15/2013 0535   MCHC 32.2 12/15/2013 0535   RDW 12.9 12/15/2013 0535   LYMPHSABS 1.9 12/10/2013 0725   MONOABS 1.1* 12/10/2013 0725   EOSABS 0.2 12/10/2013 0725   BASOSABS 0.0 12/10/2013 0725   CBG's (last 3):  No results found for this basename: GLUCAP,  in the last 72 hours LFT's Lab Results  Component Value Date   ALT 15 12/10/2013   AST 24 12/10/2013   ALKPHOS 105 12/10/2013   BILITOT 0.4 12/10/2013    Studies/Results: No results found.  Medications:  I have reviewed the patient's current medications. Scheduled Medications: . calcium carbonate  1,250 mg Oral BID WC  . diclofenac sodium  2 g Topical TID  . feeding supplement (RESOURCE BREEZE)   1 Container Oral TID BM  . pantoprazole  40 mg Oral Daily  . polyethylene glycol  17 g Oral Daily  . Vitamin D (Ergocalciferol)  50,000 Units Oral Q7 days  . Warfarin - Pharmacist Dosing Inpatient   Does not apply q1800   PRN Medications: acetaminophen, ondansetron (ZOFRAN) IV, ondansetron, oxyCODONE, sorbitol, traZODone  Assessment/Plan: Active Problems:   Fall 1. Functional deficits secondary to fall sustaining a displaced right olecranon fracture and nondisplaced small right greater trochanteric femur fracture. Status post ORIF olecranon fracture. Nonweightbearing right upper extremity with platform walker weightbearing as tolerated right lower extremity  2. DVT: Right CFV dvt.  -anticoagulation per pharmacy protocol---INR therapeutic-  3. Pain Management: Oxycodone as needed.  -voltaren gel to chest wall  4. Constipation. Reports improved with Miralax  5. Neuropsych: This patient is capable of making decisions on her own behalf.  6. Skin/Wound Care: Routine skin checks ongoing     Length of stay, days: 10    Valerie A. Asa Lente, MD 12/19/2013, 8:31 AM

## 2013-12-19 NOTE — Progress Notes (Signed)
ANTICOAGULATION CONSULT NOTE - Follow Up Consult  Pharmacy Consult for coumadin Indication: DVT  Allergies  Allergen Reactions  . Adhesive [Tape] Other (See Comments)    Tears skin  . Amoxicillin Rash  . Hydrocodone Nausea Only    Patient Measurements: Height: 5' (152.4 cm) Weight: 87 lb 4.8 oz (39.599 kg) IBW/kg (Calculated) : 45.5  Vital Signs: Temp: 98 F (36.7 C) (08/09 0526) Temp src: Oral (08/09 0526) BP: 113/62 mmHg (08/09 0526) Pulse Rate: 88 (08/09 0526)  Labs:  Recent Labs  12/17/13 0420 12/18/13 0625 12/19/13 0535  LABPROT 27.3* 26.1* 25.5*  INR 2.53* 2.39* 2.32*    Estimated Creatinine Clearance: 32.7 ml/min (by C-G formula based on Cr of 0.63).   Medications:  Scheduled:  . calcium carbonate  1,250 mg Oral BID WC  . diclofenac sodium  2 g Topical TID  . feeding supplement (RESOURCE BREEZE)  1 Container Oral TID BM  . pantoprazole  40 mg Oral Daily  . polyethylene glycol  17 g Oral Daily  . Vitamin D (Ergocalciferol)  50,000 Units Oral Q7 days  . Warfarin - Pharmacist Dosing Inpatient   Does not apply q1800   Infusions:    Assessment: 80 year old female with DVT who is therapeutic on Coumadin.  Her INR has been relatively stable x 3 days and she has received an average of ~2.5mg  of Coumadin/day this admission.  Goal of Therapy:  INR 2-3   Plan:  Coumadin 2.5mg  today Daily PT/INR  Legrand Como, Pharm.D., BCPS, AAHIVP Clinical Pharmacist Phone: [phone] or [phone] 12/19/2013, 8:39 AM

## 2013-12-20 ENCOUNTER — Encounter (HOSPITAL_COMMUNITY): Payer: Medicare Other | Admitting: Occupational Therapy

## 2013-12-20 ENCOUNTER — Inpatient Hospital Stay (HOSPITAL_COMMUNITY): Payer: Medicare Other

## 2013-12-20 DIAGNOSIS — S72009A Fracture of unspecified part of neck of unspecified femur, initial encounter for closed fracture: Secondary | ICD-10-CM

## 2013-12-20 DIAGNOSIS — Z5189 Encounter for other specified aftercare: Secondary | ICD-10-CM

## 2013-12-20 LAB — PROTIME-INR
INR: 2.53 — AB (ref 0.00–1.49)
Prothrombin Time: 27.3 seconds — ABNORMAL HIGH (ref 11.6–15.2)

## 2013-12-20 MED ORDER — WARFARIN SODIUM 2.5 MG PO TABS
2.5000 mg | ORAL_TABLET | Freq: Once | ORAL | Status: AC
  Administered 2013-12-20: 2.5 mg via ORAL
  Filled 2013-12-20: qty 1

## 2013-12-20 NOTE — Progress Notes (Signed)
Occupational Therapy Discharge Summary  Patient Details  Name: Vanessa Pace MRN: [id number] Date of Birth: [birth year]  Today's Date: 12/21/2013  Patient has met 7 of 7 long term goals due to improved activity tolerance, improved balance, postural control, ability to compensate for deficits, improved attention, improved awareness and improved coordination.  Patient to discharge at overall Supervision level.  Patient's care partner is independent to provide the necessary supervision assistance at discharge.    Reasons goals not met: n/a, all goals met at this time.  Recommendation:  Patient will benefit from ongoing skilled OT services in home health setting to continue to advance functional skills in the area of BADL, iADL and Reduce care partner burden.  Equipment: tub transfer bench and BSC  Reasons for discharge: treatment goals met and discharge from hospital  Patient/family agrees with progress made and goals achieved: Yes  Precautions/Restrictions  Precautions Precautions: Fall Precaution Comments: HOH Required Braces or Orthoses: Sling Restrictions Weight Bearing Restrictions: Yes RUE Weight Bearing: Partial weight bearing RLE Weight Bearing: Weight bearing as tolerated  ADL - See FIM  Vision/Perception  Vision- History Baseline Vision/History: Wears glasses Wears Glasses: Reading only Patient Visual Report: No change from baseline Vision- Assessment Vision Assessment?: No apparent visual deficits   Cognition Overall Cognitive Status: Within Functional Limits for tasks assessed Arousal/Alertness: Awake/alert Orientation Level: Oriented X4 Attention: Sustained;Selective Focused Attention: Appears intact Sustained Attention: Appears intact Selective Attention: Appears intact Memory: Appears intact Awareness: Impaired Awareness Impairment: Emergent impairment;Anticipatory impairment Problem Solving: Impaired Problem Solving Impairment: Functional  basic Safety/Judgment: Impaired Comments: fall risk  Sensation Sensation Light Touch: Appears Intact Proprioception: Appears Intact Coordination Gross Motor Movements are Fluid and Coordinated: Yes Fine Motor Movements are Fluid and Coordinated: Yes  Motor  Motor Motor: Other (comment) (limited by RUE and RLE injury) Motor - Skilled Clinical Observations: generalized weakness Motor - Discharge Observations: generalized incoordination with increased strength  Mobility  Bed Mobility Bed Mobility: Supine to Sit;Sit to Supine Supine to Sit: 6: Modified independent (Device/Increase time) Sit to Supine: 6: Modified independent (Device/Increase time) Transfers Sit to Stand: 5: Supervision Stand to Sit: 5: Supervision   Trunk/Postural Assessment  Cervical Assessment Cervical Assessment: Exceptions to [facility] Thoracic Assessment Thoracic Assessment: Exceptions to [facility] Lumbar Assessment Lumbar Assessment: Exceptions to [facility] Postural Control Postural Control: Within Functional Limits   Balance Balance Balance Assessed: No Standardized Balance Assessment Standardized Balance Assessment: Berg Balance Test Berg Balance Test Sit to Stand: Able to stand  independently using hands Standing Unsupported: Able to stand 2 minutes with supervision Sitting with Back Unsupported but Feet Supported on Floor or Stool: Able to sit safely and securely 2 minutes Stand to Sit: Sits safely with minimal use of hands Transfers: Able to transfer with verbal cueing and /or supervision Standing Unsupported with Eyes Closed: Able to stand 10 seconds with supervision Standing Ubsupported with Feet Together: Needs help to attain position but able to stand for 30 seconds with feet together From Standing, Reach Forward with Outstretched Arm: Can reach forward >5 cm safely (2") From Standing Position, Pick up Object from Floor: Able to pick up shoe, needs supervision From Standing Position, Turn to Look Behind  Over each Shoulder: Turn sideways only but maintains balance Turn 360 Degrees: Needs close supervision or verbal cueing Standing Unsupported, Alternately Place Feet on Step/Stool: Able to complete >2 steps/needs minimal assist Standing Unsupported, One Foot in Front: Able to take small step independently and hold 30 seconds Standing on One Leg: Unable to try or  needs assist to prevent fall Total Score: 31 Static Sitting Balance Static Sitting - Balance Support: Feet supported;No upper extremity supported Static Sitting - Level of Assistance: 6: Modified independent (Device/Increase time) Static Standing Balance Static Standing - Balance Support: Right upper extremity supported (RUE in sling) Static Standing - Level of Assistance: 5: Stand by assistance  Extremity/Trunk Assessment RUE Assessment RUE Assessment: Not tested (Not fully tested due to cast. Patient's shoulder PROM is wfl) LUE Assessment LUE Assessment: Within Functional Limits  See FIM for current functional status  Vanessa Pace 12/21/2013, 7:52 AM

## 2013-12-20 NOTE — Progress Notes (Signed)
Occupational Therapy Session Note  Patient Details  Name: Vanessa Pace MRN: [id number] Date of Birth: 30-Jun-1926  Today's Date: 12/20/2013 Time: 3295-1884 Time Calculation (min): 30 min  Short Term Goals: Week 2:  OT Short Term Goal 1 (Week 2): Short Term Goals = Long Term Goals  Skilled Therapeutic Interventions/Progress Updates:    Pt seen for 1:1 OT session with focus on functional mobility, functional transfers, family education, and RUE AROM. Pt received sitting in recliner with daughter [name]) present. Ambulated with [place] to ADL apartment with [name] providing supervision assistance. Practiced tub transfer with TTB and [name] providing supervision. Returned to room and completed RUE shoulder AROM and PROM exercises. Pt tolerating well. [name] and pt with no questions or concerns at this time. Pt left sitting in recliner chair with all needs in reach.    Therapy Documentation Precautions:  Precautions Precautions: Fall Precaution Comments: HOH Required Braces or Orthoses: Sling Restrictions Weight Bearing Restrictions: Yes RUE Weight Bearing: Partial weight bearing RLE Weight Bearing: Weight bearing as tolerated General:   Vital Signs: Therapy Vitals Temp: 98.5 F (36.9 C) Temp src: Oral Pulse Rate: 90 Resp: 18 BP: 115/67 mmHg Patient Position (if appropriate): Sitting Oxygen Therapy SpO2: 96 % O2 Device: None (Room air) Pain: No report of pain during therapy session.   See FIM for current functional status  Therapy/Group: Individual Therapy  Duayne Cal 12/20/2013, 3:18 PM

## 2013-12-20 NOTE — Progress Notes (Signed)
ANTICOAGULATION CONSULT NOTE - Follow Up Consult  Pharmacy Consult for coumadin Indication: DVT  Allergies  Allergen Reactions  . Adhesive [Tape] Other (See Comments)    Tears skin  . Amoxicillin Rash  . Hydrocodone Nausea Only    Patient Measurements: Height: 5' (152.4 cm) Weight: 87 lb 4.8 oz (39.599 kg) IBW/kg (Calculated) : 45.5 Heparin Dosing Weight:   Vital Signs: Temp: 98.3 F (36.8 C) (08/10 0607) Temp src: Oral (08/10 0607) BP: 101/67 mmHg (08/10 0607) Pulse Rate: 82 (08/10 0607)  Labs:  Recent Labs  12/18/13 0625 12/19/13 0535 12/20/13 0553  LABPROT 26.1* 25.5* 27.3*  INR 2.39* 2.32* 2.53*    Estimated Creatinine Clearance: 32.7 ml/min (by C-G formula based on Cr of 0.63).   Medications:  Scheduled:  . calcium carbonate  1,250 mg Oral BID WC  . diclofenac sodium  2 g Topical TID  . feeding supplement (RESOURCE BREEZE)  1 Container Oral TID BM  . pantoprazole  40 mg Oral Daily  . polyethylene glycol  17 g Oral Daily  . Vitamin D (Ergocalciferol)  50,000 Units Oral Q7 days  . Warfarin - Pharmacist Dosing Inpatient   Does not apply q1800   Infusions:    Assessment: [name] is currently on therapeutic coumadin.  INR today is 2.53. Goal of Therapy:  INR 2-3 Monitor platelets by anticoagulation protocol: Yes   Plan:  1) Coumadin 2.5mg  po x1 2) INR in am  Aasir Daigler, Tsz-Yin 12/20/2013,8:08 AM

## 2013-12-20 NOTE — Progress Notes (Signed)
Occupational Therapy Session Note  Patient Details  Name: Vanessa Pace MRN: [id number] Date of Birth: 10-Nov-1926  Today's Date: 12/20/2013 Time: 5859-2924 Time Calculation (min): 60 min  Short Term Goals: Week 1:  OT Short Term Goal 1 (Week 1): Pt. will donn and doff shoulder sling independently with increased time as needed in order to increase I with self care. OT Short Term Goal 1 - Progress (Week 1): Met OT Short Term Goal 2 (Week 1): Pt. will perform bathing, at sink side or EOB, with steady assist only in order to increase I with self care. OT Short Term Goal 2 - Progress (Week 1): Met OT Short Term Goal 3 (Week 1): Pt. will perform UB dressing with Min A in order to increased I with dressing. OT Short Term Goal 3 - Progress (Week 1): Met OT Short Term Goal 4 (Week 1): Pt. will perform LB dressing with min A in order to increase I in dressing skills. OT Short Term Goal 4 - Progress (Week 1): Met  Week 2:  OT Short Term Goal 1 (Week 2): Short Term Goals = Long Term Goals  Skilled Therapeutic Interventions/Progress Updates:  Patient received seated in recliner with increased complaints of pain, no rate given; RN made aware. Patient ambulated into BR using single point cane with daughter, Vanessa Pace for toilet transfer, toileting, then shower stall transfer. Vanessa Pace assisted patient with covering RUE for shower. Focused skilled intervention on family education>Kay, ADL at shower level in sit<>stand position, UB/LB dressing in sit<>stand position from recliner, functional mobility/transfers, grooming tasks standing at sink, and overall activity tolerance/endurance. At end of session, left patient standing at sink for grooming tasks with daughter providing supervision. Plan is for patient to discharge > home tomorrow with 24/7 supervision from daughter, Vanessa Pace.   Vanessa Pace is independent to assist patient prn at home. Please see discharge summary for more information about discharge.   Precautions:   Precautions Precautions: Fall Precaution Comments: HOH Required Braces or Orthoses: Sling Restrictions Weight Bearing Restrictions: Yes RUE Weight Bearing: Partial weight bearing RLE Weight Bearing: Weight bearing as tolerated  See FIM for current functional status  Therapy/Group: Individual Therapy  Hans Rusher 12/20/2013, 10:47 AM

## 2013-12-20 NOTE — Progress Notes (Signed)
Physical Therapy Session Note  Patient Details  Name: Vanessa Pace MRN: [id number] Date of Birth: [birth year]  Today's Date: 12/20/2013 Time: 1100-1130 Time Calculation (min): 30 min  Short Term Goals: Week 2:  PT Short Term Goal 1 (Week 2): = LTG of supervision overall   Skilled Therapeutic Interventions/Progress Updates:    Session focused on balance evaluation and functional ambulation. Pt completed Berg Balance Test, see results below. Discussed score and interpretation with pt and dtr. Including high risk for falls if ambulating w/o AD. Pt and daughter expressed understanding. Ambulated 150' w/ SPC back to room w/ dtr providing safe guarding. Pt left in recliner w/ dtr present w/ all needs within reach.  Therapy Documentation Precautions:  Precautions Precautions: Fall Precaution Comments: HOH Required Braces or Orthoses: Sling Restrictions Weight Bearing Restrictions: Yes RUE Weight Bearing: Partial weight bearing RLE Weight Bearing: Weight bearing as tolerated General:   Vital Signs: Therapy Vitals Temp: 98.5 F (36.9 C) Temp src: Oral Pulse Rate: 90 Resp: 18 BP: 115/67 mmHg Patient Position (if appropriate): Sitting Oxygen Therapy SpO2: 96 % O2 Device: None (Room air) Pain:   Mobility:   Locomotion :    Trunk/Postural Assessment :    Balance: Balance Balance Assessed: Yes Standardized Balance Assessment Standardized Balance Assessment: Berg Balance Test Berg Balance Test Sit to Stand: Able to stand  independently using hands Standing Unsupported: Able to stand 2 minutes with supervision Sitting with Back Unsupported but Feet Supported on Floor or Stool: Able to sit safely and securely 2 minutes Stand to Sit: Sits safely with minimal use of hands Transfers: Able to transfer with verbal cueing and /or supervision Standing Unsupported with Eyes Closed: Able to stand 10 seconds with supervision Standing Ubsupported with Feet Together: Needs help to  attain position but able to stand for 30 seconds with feet together From Standing, Reach Forward with Outstretched Arm: Can reach forward >5 cm safely (2") From Standing Position, Pick up Object from Floor: Able to pick up shoe, needs supervision From Standing Position, Turn to Look Behind Over each Shoulder: Turn sideways only but maintains balance Turn 360 Degrees: Needs close supervision or verbal cueing Standing Unsupported, Alternately Place Feet on Step/Stool: Able to complete >2 steps/needs minimal assist Standing Unsupported, One Foot in Front: Able to take small step independently and hold 30 seconds Standing on One Leg: Unable to try or needs assist to prevent fall Total Score: 31 Exercises:   Other Treatments:    See FIM for current functional status  Therapy/Group: Individual Therapy  Novalyn, Lajara Rada Hay, PT, DPT 12/20/2013, 3:35 PM

## 2013-12-20 NOTE — Progress Notes (Signed)
[facility] PHYSICAL MEDICINE & REHABILITATION     PROGRESS NOTE    Subjective/Complaints: Amb with PT Remembered me from >1wk ago A  review of systems has been performed and if not noted above is otherwise negative.   Objective: Vital Signs: Blood pressure 101/67, pulse 82, temperature 98.3 F (36.8 C), temperature source Oral, resp. rate 18, height 5' (1.524 m), weight 39.599 kg (87 lb 4.8 oz), SpO2 96.00%. No results found. No results found for this basename: WBC, HGB, HCT, PLT,  in the last 72 hours No results found for this basename: NA, K, CL, CO, GLUCOSE, BUN, CREATININE, CALCIUM,  in the last 72 hours  CBG (last 3)  No results found for this basename: GLUCAP,  in the last 72 hours  Wt Readings from Last 3 Encounters:  12/15/13 39.599 kg (87 lb 4.8 oz)  12/04/13 40.37 kg (89 lb)  12/04/13 40.37 kg (89 lb)    Physical Exam:  Constitutional: She is oriented to person, place, and time. Frail appearing HENT: oral mucosa moist Head: Normocephalic.  Eyes: EOM are normal.  Neck: Normal range of motion. Neck supple. No thyromegaly present.  Cardiovascular: Normal rate and regular rhythm. murmur Respiratory: Effort normal and breath sounds normal. No respiratory distress. No wheezes GI: Soft. Bowel sounds are normal. She exhibits no distension. No pain Neurological: She is alert and oriented to person, place, and time.  Follows full commands. hoh. Skin: Skin is warm and dry. Scattered ecchymoses and bruises Right upper extremity with soft cast and shoulder sling. Appropriately tender.  right hip pain with ROM.  left upper extremity 5/5 in deltoid, bicep, tricep, grip  Right grip is 4 minus she has pain with range of motion of her right fingers  Extremities show osteoarthritic changes bilateral fingers and toes  Right lower extremity 4 minus hip flexion knee extensor ankle dorsi flexion plantarflexion  Left lower extremity 5/5 hip flexion knee extension ankle dorsiflexion  plantar flexion  Psych: pleasant and cooperative Musc: superior chest/ribs tender to palpation and shoulder ROM---palpation accurately reproduced pain   Assessment/Plan: 1. Functional deficits secondary to right olecranon/greater troch fx's which require 3+ hours per day of interdisciplinary therapy in a comprehensive inpatient rehab setting. Physiatrist is providing close team supervision and 24 hour management of active medical problems listed below. Physiatrist and rehab team continue to assess barriers to discharge/monitor patient progress toward functional and medical goals. Plan D/C in am FIM: FIM - Bathing Bathing Steps Patient Completed: Chest;Right Arm;Left Arm;Abdomen;Left upper leg;Right upper leg;Buttocks;Left lower leg (including foot);Right lower leg (including foot);Front perineal area Bathing: 5: Supervision: Safety issues/verbal cues  FIM - Upper Body Dressing/Undressing Upper body dressing/undressing steps patient completed: Thread/unthread right bra strap;Thread/unthread left bra strap;Hook/unhook bra;Thread/unthread right sleeve of pullover shirt/dresss;Thread/unthread left sleeve of pullover shirt/dress;Put head through opening of pull over shirt/dress;Pull shirt over trunk Upper body dressing/undressing: 5: Set-up assist to: Apply TLSO, cervical collar (A to donn shoulder sling) FIM - Lower Body Dressing/Undressing Lower body dressing/undressing steps patient completed: Thread/unthread right underwear leg;Thread/unthread left underwear leg;Pull underwear up/down;Thread/unthread right pants leg;Thread/unthread left pants leg;Pull pants up/down;Fasten/unfasten pants;Don/Doff right sock;Don/Doff left sock Lower body dressing/undressing: 5: Supervision: Safety issues/verbal cues  FIM - Toileting Toileting steps completed by patient: Adjust clothing prior to toileting;Performs perineal hygiene;Adjust clothing after toileting Toileting Assistive Devices: Grab bar or rail for  support Toileting: 4: Steadying assist  FIM - Radio producer Devices: Grab bars;Cane Toilet Transfers: 5-To toilet/BSC: Supervision (verbal cues/safety issues);5-From toilet/BSC: Supervision (verbal  cues/safety issues)  FIM - Control and instrumentation engineer Devices: Cane;Arm rests Bed/Chair Transfer: 5: Supine > Sit: Supervision (verbal cues/safety issues);5: Sit > Supine: Supervision (verbal cues/safety issues);5: Chair or W/C > Bed: Supervision (verbal cues/safety issues);5: Bed > Chair or W/C: Supervision (verbal cues/safety issues)  FIM - Locomotion: Wheelchair Distance: 15 Locomotion: Wheelchair: 1: Total Assistance/staff pushes wheelchair (Pt<25%) FIM - Locomotion: Ambulation Locomotion: Ambulation Assistive Devices: Journalist, newspaper Ambulation/Gait Assistance: 4: Min guard Locomotion: Ambulation: 4: Travels 150 ft or more with minimal assistance (Pt.>75%)  Comprehension Comprehension Mode: Auditory Comprehension: 5-Follows basic conversation/direction: With extra time/assistive device  Expression Expression Mode: Verbal Expression: 5-Expresses basic needs/ideas: With extra time/assistive device  Social Interaction Social Interaction: 6-Interacts appropriately with others with medication or extra time (anti-anxiety, antidepressant).  Problem Solving Problem Solving: 5-Solves basic 90% of the time/requires cueing < 10% of the time  Memory Memory: 5-Recognizes or recalls 90% of the time/requires cueing < 10% of the time  Medical Problem List and Plan:  1. Functional deficits secondary to fall sustaining a displaced right olecranon fracture and nondisplaced small right greater trochanteric femur fracture. Status post ORIF olecranon fracture. Nonweightbearing right upper extremity with platform walker weightbearing as tolerated right lower extremity  2. DVT: Right CFV dvt.    -coumadin/lovenox---INR therapeutic---dc lovenox        3. Pain Management: Oxycodone as needed.   -voltaren gel to chest wall  4. Constipation. Adjusted bowel program. Two bm's yesterday 5. Neuropsych: This patient is capable of making decisions on her own behalf.  6. Skin/Wound Care: Routine skin checks ongoing 7. Nausea/vomiting--resolved    LOS (Days) 11 A FACE TO FACE EVALUATION WAS PERFORMED  Charlett Blake 12/20/2013 8:33 AM

## 2013-12-20 NOTE — Progress Notes (Addendum)
Physical Therapy Session Note  Patient Details  Name: Vanessa Pace MRN: [id number] Date of Birth: 12-03-1926  Today's Date: 12/20/2013 Time: 0800-0900 Total therapy session lasted 60 min  Short Term Goals: Week 1:  PT Short Term Goal 1 (Week 1): Pt to perform bed mobility with supervision, 50% of time  PT Short Term Goal 1 - Progress (Week 1): Met PT Short Term Goal 2 (Week 1): Pt to perform bed<>chair transfers with superivsion, 50% of time PT Short Term Goal 2 - Progress (Week 1): Met PT Short Term Goal 3 (Week 1): Pt to propel w/c 150' with supervision PT Short Term Goal 3 - Progress (Week 1): Discontinued (comment) (not appropriate secondary to UE WB status) PT Short Term Goal 4 (Week 1): Pt to ambulate 150' with LRAD and supervision, 50% of time PT Short Term Goal 4 - Progress (Week 1): Met PT Short Term Goal 5 (Week 1): Pt to maintain standing balance during functional tasks w/ LRAD and supervision, 50% of time PT Short Term Goal 5 - Progress (Week 1): Met Week 2:  PT Short Term Goal 1 (Week 2): = LTG of supervision overall   Skilled Therapeutic Interventions/Progress Updates:  AM Session (60 min): Pt. Received semi-reclined in bed having breakfast; indicated she received it at 7:53 am. Required additional time to eat; positioned pt. EOB for postural strength/stability to finish meal. Pt. Indicates she requested bedpan overnight, but request was fulfilled; was tucked in like a "mummy" and was uncomfortable last night.  1:1 Treatment focused on gait, balance, and functional use of SPC. Pt. Assist level is (S) with SPC. Treatment consisted of ambulating 150 feet x2 with occasional verbal cue for proper sequencing with SPC. 5 Stairs up/down with min guard to min A for safety; pt. Requires frequent verbal cues for proper technique. Pt. Static balance with dynamic stance with SPC for increased stability and proper placement of SPC tip to perform functional tasks, environmental  scanning, and to take standing rest breaks with BLE weight shifting for comfort. Pt. Continues to have internal and external distractions during ambulation; use of transport chair is recommended for interacting or being around large groups for safety.  Pain: see below  Pt. Condition post therapy session: positioned seated in recliner with feet elevated with all needs within reach. No family were present for grad day activities and additional coaching/family education.  Therapy Documentation Precautions:  Precautions Precautions: Fall Precaution Comments: HOH Required Braces or Orthoses: Sling Restrictions Weight Bearing Restrictions: Yes RUE Weight Bearing: Partial weight bearing RLE Weight Bearing: Weight bearing as tolerated  Pain: Pain Assessment Pain Assessment: No/denies pain Pain Score: 0-No pain  See FIM for current functional status  Therapy/Group: Individual Therapy  Juluis Mire 12/20/2013, 8:07 AM

## 2013-12-20 NOTE — Discharge Summary (Signed)
Discharge summary job [id number]

## 2013-12-20 NOTE — Progress Notes (Signed)
Physical Therapy Session Note  Patient Details  Name: Vanessa Pace MRN: [id number] Date of Birth: 24-Aug-1926  Today's Date: 12/20/2013  Session note to supplement Discharge note by PT.     Juluis Mire 12/20/2013, 5:07 PM

## 2013-12-20 NOTE — Progress Notes (Signed)
Physical Therapy Discharge Summary  Patient Details  Name: Vanessa Pace MRN: [id number] Date of Birth: [birth year]  Today's Date: 12/20/2013  Patient has met 9 of 9 long term goals due to improved activity tolerance, improved balance, increased strength, decreased pain, improved ability to compensate for vestibular impairments, improved attention and improved awareness.  Patient to discharge at an ambulatory level Supervision.   Patient's care partner is independent to provide the necessary physical, cognitive and supervision assistance at discharge.  Reasons goals not met: All goals met  Recommendation:  Patient will benefit from ongoing skilled PT services in home health setting to continue to advance safe functional mobility, address ongoing impairments in impaired strength and ROM RUE, pain and decreased strength in RLE, impaired activity tolerance/endurance, vestibular impairments, impaired balance, gait, and minimize fall risk.  Equipment: transport wheelchair and [facility]  Reasons for discharge: treatment goals met and discharge from hospital  Patient/family agrees with progress made and goals achieved: Yes  PT Discharge Precautions/Restrictions Precautions Precautions: Fall Precaution Comments: HOH Required Braces or Orthoses: Sling Restrictions Weight Bearing Restrictions: Yes RUE Weight Bearing: Partial weight bearing RLE Weight Bearing: Weight bearing as tolerated Pain Pain Assessment Pain Assessment: No/denies pain Pain Score: 2  Pain Type: Acute pain Pain Location: Neck  Cognition Overall Cognitive Status: Within Functional Limits for tasks assessed Arousal/Alertness: Awake/alert Orientation Level: Oriented X4 Attention: Sustained;Selective Focused Attention: Appears intact Sustained Attention: Appears intact Selective Attention: Appears intact Memory: Appears intact Awareness: Impaired Awareness Impairment: Emergent impairment;Anticipatory impairment Problem  Solving: Impaired Problem Solving Impairment: Functional basic Safety/Judgment: Impaired Comments: fall risk Sensation Sensation Light Touch: Appears Intact Proprioception: Appears Intact Coordination Gross Motor Movements are Fluid and Coordinated: Yes Fine Motor Movements are Fluid and Coordinated: Yes Motor  Motor Motor: Other (comment) (limited by R UE and R LE injury) Motor - Skilled Clinical Observations: generalized weakness Motor - Discharge Observations: generalized incoordination with increased strength  Mobility Bed Mobility Bed Mobility: Supine to Sit;Sit to Supine Supine to Sit: 6: Modified independent (Device/Increase time) Sit to Supine: 6: Modified independent (Device/Increase time) Transfers Transfers: Yes Sit to Stand: 5: Supervision Stand to Sit: 5: Supervision Stand Pivot Transfers: 5: Supervision Locomotion   Please see PTA and PT notes   Balance Balance Balance Assessed: Yes Standardized Balance Assessment Standardized Balance Assessment: Berg Balance Test Berg Balance Test Sit to Stand: Able to stand  independently using hands Standing Unsupported: Able to stand 2 minutes with supervision Sitting with Back Unsupported but Feet Supported on Floor or Stool: Able to sit safely and securely 2 minutes Stand to Sit: Sits safely with minimal use of hands Transfers: Able to transfer with verbal cueing and /or supervision Standing Unsupported with Eyes Closed: Able to stand 10 seconds with supervision Standing Ubsupported with Feet Together: Needs help to attain position but able to stand for 30 seconds with feet together From Standing, Reach Forward with Outstretched Arm: Can reach forward >5 cm safely (2") From Standing Position, Pick up Object from Floor: Able to pick up shoe, needs supervision From Standing Position, Turn to Look Behind Over each Shoulder: Turn sideways only but maintains balance Turn 360 Degrees: Needs close supervision or verbal  cueing Standing Unsupported, Alternately Place Feet on Step/Stool: Able to complete >2 steps/needs minimal assist Standing Unsupported, One Foot in Front: Able to take small step independently and hold 30 seconds Standing on One Leg: Unable to try or needs assist to prevent fall Total Score: 31 Patient demonstrates increased fall risk as noted by  score of 31/56 on Berg Balance Scale.  (<36= high risk for falls, close to 100%; 37-45 significant >80%; 46-51 moderate >50%; 52-55 lower >25%)  Extremity Assessment  RUE Assessment RUE Assessment: Not tested (Not fully tested due to cast. Patient's shoulder PROM is wfl) LUE Assessment LUE Assessment: Within Functional Limits RLE Assessment RLE Assessment: Within Functional Limits but still presents with pain and limited ROM in hip LLE Assessment LLE Assessment: Within Functional Limits  See FIM for current functional status  Raylene Everts [facility] 12/20/2013, 11:24 AM

## 2013-12-20 NOTE — Discharge Summary (Signed)
NAMEMORRISA, ALDABA NO.:  [id number]  MEDICAL RECORD NO.:  [id number]  LOCATION:  [id number]                        FACILITY:  [facility]  PHYSICIAN:  Meredith Staggers, M.D.DATE OF BIRTH:  1926/10/28  DATE OF ADMISSION:  12/09/2013 DATE OF DISCHARGE:                              DISCHARGE SUMMARY   DISCHARGE DIAGNOSES: 1. Functional deficits secondary to a fall sustaining displaced right     olecranon fracture and nondisplaced small right greater     trochanteric femur fracture. 2. Right common femoral deep vein thrombosis obtained per venous     Doppler studies, December 10, 2013. 3. Coumadin for deep vein thrombosis prophylaxis. 4. Constipation, resolved. 5. Decreased nutritional storage, improved.  HISTORY OF PRESENT ILLNESS:  This is an 80 year old right-handed female with unremarkable past medical history admitted December 04, 2013, after a fall when she slipped over her cat in the house.  The patient independent prior to admission living with her family.  She denied loss of consciousness.  Small laceration on the scalp posterior aspect. Cranial CT scan negative for any intracranial abnormalities.  There was a small right parietal scalp hematoma.  X-rays and imaging showed a displaced right olecranon fracture as well as nondisplaced small right greater trochanteric femur fracture.  Underwent ORIF olecranon fracture December 06, 2013, per Dr. Grandville Silos and noncooperative conservative care of right trochanteric femur fracture.  Partial weightbearing right upper extremity with a platform walker.  Weightbearing as tolerated right lower extremity.  Hospital course pain management.  Physical and occupational therapy ongoing.  The patient was admitted for a comprehensive rehab program.  PAST MEDICAL HISTORY:  See discharge diagnoses.  SOCIAL HISTORY:  Lives with family.  FUNCTIONAL HISTORY:  Prior to admission independent.  FUNCTIONAL STATUS:  Upon admission to rehab  services was minimal assist, ambulate 80 feet with a quad cane, minimal assist sit to stand, min to mod assist activities of daily living.  PHYSICAL EXAMINATION:  VITAL SIGNS:  Blood pressure 132/56, pulse 94, temperature 98.2, respirations 18. GENERAL:  This was an alert female, in no acute distress.  Followed full commands. LUNGS:  Clear to auscultation. CARDIAC:  Regular rate and rhythm. ABDOMEN:  Soft, nontender.  Good bowel sounds. EXTREMITIES:  Right upper extremity with a soft cast and shoulder sling in place.  REHABILITATION HOSPITAL COURSE:  The patient was admitted to inpatient rehab services with therapies initiated on a 3-hour daily basis consisting of physical therapy, occupational therapy, and rehabilitation nursing.  The following issues were addressed during the patient's rehabilitation stay.  Pertaining to Mrs. Pritts's functional deficits secondary to displaced right olecranon fracture and nondisplaced small right greater trochanteric femur fracture remained stable.  She had undergone ORIF of olecranon fracture.  She would follow up with Orthopedic Services.  She was nonweightbearing right upper extremity and weightbearing as tolerated right lower extremity.  Upon admission to rehab services, sequential compression devices in place for DVT prophylaxis.  Venous Doppler studies completed lower extremity December 10, 2013, due to some mild leg swelling that did show findings consistent with acute deep vein thrombosis involving the mid to proximal common femoral vein.  At that time, she was placed on subcutaneous  Lovenox as well as Coumadin for DVT and remained on Lovenox until INR therapeutic. No bleeding episodes noted.  She would remain on Coumadin for approximately 6 months.  Her primary MD, Dr. Kelton Pillar was contacted to follow the patient's Coumadin on discharge with a home health nurse arranged [phone].  She had no chest pain or shortness of breath throughout  her rehab course.  Pain management with the use of oxycodone in good results.  She had some mild constipation resolved with laxative assistance.  The patient participated fully with her therapy. She received weekly collaborative interdisciplinary team conferences to discuss estimated length of stay, family teaching, and any barriers to discharge.  The patient before transfers edge of bed to supine, supine to edge of bed with supervision, some verbal cues.  Ambulated 200 feet straight cane, minimal guard, occasional cuing for safety.  Performed car transfers with supervision to minimal guard.  She was able to navigate stairs with her straight cane and minimal assist.  She was able to gather her belongings for activities of daily living.  Received full family education in regards to all of her safety issues.  Home health physical and occupational therapies would be arranged as well as a home health nurse while the patient remained on Coumadin therapy.  DISCHARGE MEDICATIONS:  At the time of dictation included; 1. Tylenol as needed. 2. Voltaren gel 3 times daily to affected area. 3. Os-Cal 1250 mg p.o. b.i.d. 4. Oxycodone immediate release 5-10 mg every 4 hours as needed for     breakthrough pain, dispense of 90 tablets. 5. Protonix 40 mg p.o. daily. 6. MiraLAX daily hold for loose stools. 7. Vitamin D 50,000 units p.o. every 7 days. 8. Coumadin latest dose of 2.0 mg adjusted accordingly for INR of 2.0-     3.0. Today's INR on 12/21/2013 2.74  SPECIAL INSTRUCTIONS:  Included partial weightbearing right upper extremity with platform walker.  Weightbearing as tolerated right lower extremity.  A home health nurse had been arranged to check INR on Friday, December 24, 2013, results to Dr. Kelton Pillar, [phone], fax [phone].  The patient would follow up Dr. Alger Simons at the [facility]patient [facility]hab [facility] office as needed; Dr. Milly Jakob, Orthopedic Service in 2 weeks to call for  appointment.     Lauraine Rinne, P.A.   ______________________________ Meredith Staggers, M.D.    DA/MEDQ  D:  12/20/2013  T:  12/20/2013  Job:  [id number]  cc:   Milford Cage. Laurann Montana, M.D. Meredith Staggers, M.D.

## 2013-12-21 LAB — PROTIME-INR
INR: 2.74 — ABNORMAL HIGH (ref 0.00–1.49)
Prothrombin Time: 29 seconds — ABNORMAL HIGH (ref 11.6–15.2)

## 2013-12-21 MED ORDER — PANTOPRAZOLE SODIUM 40 MG PO TBEC: 40.0000 mg | DELAYED_RELEASE_TABLET | Freq: Every day | ORAL | Status: DC

## 2013-12-21 MED ORDER — WARFARIN SODIUM 2 MG PO TABS
2.0000 mg | ORAL_TABLET | Freq: Once | ORAL | Status: DC
  Filled 2013-12-21: qty 1

## 2013-12-21 MED ORDER — WARFARIN SODIUM 2 MG PO TABS: 2.0000 mg | ORAL_TABLET | Freq: Every day | ORAL | Status: DC

## 2013-12-21 MED ORDER — OXYCODONE HCL 5 MG PO TABS: 5.0000 mg | ORAL_TABLET | ORAL | Status: DC | PRN

## 2013-12-21 MED ORDER — DICLOFENAC SODIUM 1 % TD GEL: 2.0000 g | Freq: Three times a day (TID) | TRANSDERMAL | Status: DC

## 2013-12-21 MED ORDER — CALCIUM CARBONATE 1250 (500 CA) MG PO TABS: 1250.0000 mg | ORAL_TABLET | Freq: Two times a day (BID) | ORAL | Status: DC

## 2013-12-21 MED ORDER — WARFARIN SODIUM 2 MG PO TABS
2.0000 mg | ORAL_TABLET | Freq: Every day | ORAL | Status: DC
  Filled 2013-12-21: qty 1

## 2013-12-21 NOTE — Progress Notes (Signed)
ANTICOAGULATION CONSULT NOTE - Follow Up Consult  Pharmacy Consult for coumadin Indication: DVT  Allergies  Allergen Reactions  . Adhesive [Tape] Other (See Comments)    Tears skin  . Amoxicillin Rash  . Hydrocodone Nausea Only    Patient Measurements: Height: 5' (152.4 cm) Weight: 87 lb 4.8 oz (39.599 kg) IBW/kg (Calculated) : 45.5 Heparin Dosing Weight:   Vital Signs: Temp: 97.4 F (36.3 C) (08/11 0613) Temp src: Oral (08/11 0613) BP: 136/68 mmHg (08/11 0613) Pulse Rate: 95 (08/11 0613)  Labs:  Recent Labs  12/19/13 0535 12/20/13 0553 12/21/13 0535  LABPROT 25.5* 27.3* 29.0*  INR 2.32* 2.53* 2.74*    Estimated Creatinine Clearance: 32.7 ml/min (by C-G formula based on Cr of 0.63).   Medications:  Scheduled:  . calcium carbonate  1,250 mg Oral BID WC  . diclofenac sodium  2 g Topical TID  . feeding supplement (RESOURCE BREEZE)  1 Container Oral TID BM  . pantoprazole  40 mg Oral Daily  . polyethylene glycol  17 g Oral Daily  . Vitamin D (Ergocalciferol)  50,000 Units Oral Q7 days  . Warfarin - Pharmacist Dosing Inpatient   Does not apply q1800   Infusions:    Assessment: [name] is currently on therapeutic coumadin. INR is up again to 2.74.  Goal of Therapy:  INR 2-3 Monitor platelets by anticoagulation protocol: Yes   Plan:  - coumadin 2 mg po x1 - INR in am  Maraya Gwilliam, Tsz-Yin 12/21/2013,8:10 AM

## 2013-12-21 NOTE — Progress Notes (Signed)
Pt discharged home with daughter. Discharge instructions provided by Silvestre Mesi, PA. All questions answered. Pt verbalized understanding. Pt escorted off unit in w/c with personal belonging by Dixon Boos, NT.

## 2013-12-21 NOTE — Progress Notes (Signed)
[facility] PHYSICAL MEDICINE & REHABILITATION     PROGRESS NOTE    Subjective/Complaints: Happy about D/C today.  No c/os   Objective: Vital Signs: Blood pressure 136/68, pulse 95, temperature 97.4 F (36.3 C), temperature source Oral, resp. rate 18, height 5' (1.524 m), weight 39.599 kg (87 lb 4.8 oz), SpO2 95.00%. No results found. No results found for this basename: WBC, HGB, HCT, PLT,  in the last 72 hours No results found for this basename: NA, K, CL, CO, GLUCOSE, BUN, CREATININE, CALCIUM,  in the last 72 hours  CBG (last 3)  No results found for this basename: GLUCAP,  in the last 72 hours  Wt Readings from Last 3 Encounters:  12/15/13 39.599 kg (87 lb 4.8 oz)  12/04/13 40.37 kg (89 lb)  12/04/13 40.37 kg (89 lb)    Physical Exam:  Constitutional: She is oriented to person, place, and time. Frail appearing HENT: oral mucosa moist Head: Normocephalic.  Eyes: EOM are normal.     Assessment/Plan: 1. Functional deficits secondary to right olecranon/greater troch fx'sStable for D/C today F/u PCP in 1-2 weeks F/u ortho 1-2 weeks See D/C summary See D/C instructions FIM: FIM - Bathing Bathing Steps Patient Completed: Chest;Right Arm;Left Arm;Abdomen;Left upper leg;Right upper leg;Buttocks;Left lower leg (including foot);Right lower leg (including foot);Front perineal area Bathing: 5: Supervision: Safety issues/verbal cues  FIM - Upper Body Dressing/Undressing Upper body dressing/undressing steps patient completed: Thread/unthread right bra strap;Thread/unthread left bra strap;Hook/unhook bra;Thread/unthread right sleeve of pullover shirt/dresss;Thread/unthread left sleeve of pullover shirt/dress;Put head through opening of pull over shirt/dress;Pull shirt over trunk Upper body dressing/undressing: 5: Set-up assist to: Apply TLSO, cervical collar FIM - Lower Body Dressing/Undressing Lower body dressing/undressing steps patient completed: Thread/unthread right underwear  leg;Thread/unthread left underwear leg;Pull underwear up/down;Thread/unthread right pants leg;Thread/unthread left pants leg;Pull pants up/down;Fasten/unfasten pants;Don/Doff right sock;Don/Doff left sock Lower body dressing/undressing: 5: Supervision: Safety issues/verbal cues  FIM - Toileting Toileting steps completed by patient: Adjust clothing prior to toileting;Performs perineal hygiene;Adjust clothing after toileting Toileting Assistive Devices: Grab bar or rail for support Toileting: 5: Supervision: Safety issues/verbal cues  FIM - Radio producer Devices: Grab bars;Cane;Elevated toilet seat Toilet Transfers: 5-To toilet/BSC: Supervision (verbal cues/safety issues);5-From toilet/BSC: Supervision (verbal cues/safety issues)  FIM - Control and instrumentation engineer Devices: Best boy: 5: Supine > Sit: Supervision (verbal cues/safety issues);5: Sit > Supine: Supervision (verbal cues/safety issues);5: Bed > Chair or W/C: Supervision (verbal cues/safety issues);5: Chair or W/C > Bed: Supervision (verbal cues/safety issues) (Per PTA report)  FIM - Locomotion: Wheelchair Distance: 150 (per PTA report) Locomotion: Wheelchair: 1: Total Assistance/staff pushes wheelchair (Pt<25%) FIM - Locomotion: Ambulation Locomotion: Ambulation Assistive Devices: Journalist, newspaper (per PTA report) Ambulation/Gait Assistance: 5: Supervision Locomotion: Ambulation: 5: Travels 150 ft or more with supervision/safety issues  Comprehension Comprehension Mode: Auditory Comprehension: 5-Follows basic conversation/direction: With extra time/assistive device  Expression Expression Mode: Verbal Expression: 5-Expresses basic needs/ideas: With extra time/assistive device  Social Interaction Social Interaction: 6-Interacts appropriately with others with medication or extra time (anti-anxiety, antidepressant).  Problem Solving Problem Solving: 5-Solves basic  90% of the time/requires cueing < 10% of the time  Memory Memory: 5-Recognizes or recalls 90% of the time/requires cueing < 10% of the time  Medical Problem List and Plan:  1. Functional deficits secondary to fall sustaining a displaced right olecranon fracture and nondisplaced small right greater trochanteric femur fracture. Status post ORIF olecranon fracture. Nonweightbearing right upper extremity with platform walker weightbearing as tolerated right lower  extremity  2. DVT: Right CFV dvt.    -coumadin--INR therapeutic-     3. Pain Management: Oxycodone as needed.   -voltaren gel to chest wall  4. Constipation. Adjusted bowel program. Two bm's yesterday 5. Neuropsych: This patient is capable of making decisions on her own behalf.  6. Skin/Wound Care: Routine skin checks ongoing 7. Nausea/vomiting--resolved    LOS (Days) 12 A FACE TO FACE EVALUATION WAS PERFORMED  Charlett Blake 12/21/2013 9:37 AM

## 2013-12-21 NOTE — Progress Notes (Signed)
Social Work  Discharge Note  The overall goal for the admission was met for:   Discharge location: Yes - home with family to provide 24/7 supervision  Length of Stay: Yes - 12 days  Discharge activity level: Yes - supervision overall  Home/community participation: Yes  Services provided included: MD, RD, PT, OT, RN, Pharmacy and [place]: Medicare and Private Insurance: [place]  Follow-up services arranged: Home Health: RN, PT via [facility], DME: transport w/c, cane and tub bench via [facility] and Patient/Family has no preference for HH/DME agencies  Comments (or additional information):  Patient/Family verbalized understanding of follow-up arrangements: Yes  Individual responsible for coordination of the follow-up plan: patient  Confirmed correct DME delivered: Trigo Winterbottom 12/21/2013    Terin Dierolf

## 2013-12-22 DIAGNOSIS — S72109D Unspecified trochanteric fracture of unspecified femur, subsequent encounter for closed fracture with routine healing: Secondary | ICD-10-CM | POA: Diagnosis not present

## 2013-12-22 DIAGNOSIS — S42409A Unspecified fracture of lower end of unspecified humerus, initial encounter for closed fracture: Secondary | ICD-10-CM | POA: Diagnosis not present

## 2013-12-22 DIAGNOSIS — S52023D Displaced fracture of olecranon process without intraarticular extension of unspecified ulna, subsequent encounter for closed fracture with routine healing: Secondary | ICD-10-CM | POA: Diagnosis not present

## 2013-12-22 DIAGNOSIS — M25559 Pain in unspecified hip: Secondary | ICD-10-CM | POA: Diagnosis not present

## 2013-12-22 DIAGNOSIS — I82411 Acute embolism and thrombosis of right femoral vein: Secondary | ICD-10-CM | POA: Diagnosis not present

## 2013-12-22 DIAGNOSIS — Z9181 History of falling: Secondary | ICD-10-CM | POA: Diagnosis not present

## 2013-12-22 DIAGNOSIS — Z4889 Encounter for other specified surgical aftercare: Secondary | ICD-10-CM | POA: Diagnosis not present

## 2013-12-22 DIAGNOSIS — Z7901 Long term (current) use of anticoagulants: Secondary | ICD-10-CM | POA: Diagnosis not present

## 2013-12-23 ENCOUNTER — Encounter (INDEPENDENT_AMBULATORY_CARE_PROVIDER_SITE_OTHER): Payer: Medicare Other | Admitting: Ophthalmology

## 2013-12-23 DIAGNOSIS — H43819 Vitreous degeneration, unspecified eye: Secondary | ICD-10-CM | POA: Diagnosis not present

## 2013-12-23 DIAGNOSIS — H33309 Unspecified retinal break, unspecified eye: Secondary | ICD-10-CM | POA: Diagnosis not present

## 2013-12-23 DIAGNOSIS — H2510 Age-related nuclear cataract, unspecified eye: Secondary | ICD-10-CM

## 2013-12-23 DIAGNOSIS — H3530 Unspecified macular degeneration: Secondary | ICD-10-CM | POA: Diagnosis not present

## 2013-12-24 DIAGNOSIS — Z4889 Encounter for other specified surgical aftercare: Secondary | ICD-10-CM | POA: Diagnosis not present

## 2013-12-24 DIAGNOSIS — Z7901 Long term (current) use of anticoagulants: Secondary | ICD-10-CM | POA: Diagnosis not present

## 2013-12-24 DIAGNOSIS — S72109D Unspecified trochanteric fracture of unspecified femur, subsequent encounter for closed fracture with routine healing: Secondary | ICD-10-CM | POA: Diagnosis not present

## 2013-12-24 DIAGNOSIS — S52023D Displaced fracture of olecranon process without intraarticular extension of unspecified ulna, subsequent encounter for closed fracture with routine healing: Secondary | ICD-10-CM | POA: Diagnosis not present

## 2013-12-24 DIAGNOSIS — I82411 Acute embolism and thrombosis of right femoral vein: Secondary | ICD-10-CM | POA: Diagnosis not present

## 2013-12-24 DIAGNOSIS — Z9181 History of falling: Secondary | ICD-10-CM | POA: Diagnosis not present

## 2013-12-26 DIAGNOSIS — I82411 Acute embolism and thrombosis of right femoral vein: Secondary | ICD-10-CM | POA: Diagnosis not present

## 2013-12-26 DIAGNOSIS — S52023D Displaced fracture of olecranon process without intraarticular extension of unspecified ulna, subsequent encounter for closed fracture with routine healing: Secondary | ICD-10-CM | POA: Diagnosis not present

## 2013-12-26 DIAGNOSIS — Z9181 History of falling: Secondary | ICD-10-CM | POA: Diagnosis not present

## 2013-12-26 DIAGNOSIS — S72109D Unspecified trochanteric fracture of unspecified femur, subsequent encounter for closed fracture with routine healing: Secondary | ICD-10-CM | POA: Diagnosis not present

## 2013-12-26 DIAGNOSIS — Z4889 Encounter for other specified surgical aftercare: Secondary | ICD-10-CM | POA: Diagnosis not present

## 2013-12-26 DIAGNOSIS — Z7901 Long term (current) use of anticoagulants: Secondary | ICD-10-CM | POA: Diagnosis not present

## 2013-12-27 DIAGNOSIS — Z4889 Encounter for other specified surgical aftercare: Secondary | ICD-10-CM | POA: Diagnosis not present

## 2013-12-27 DIAGNOSIS — S72109D Unspecified trochanteric fracture of unspecified femur, subsequent encounter for closed fracture with routine healing: Secondary | ICD-10-CM | POA: Diagnosis not present

## 2013-12-27 DIAGNOSIS — Z7901 Long term (current) use of anticoagulants: Secondary | ICD-10-CM | POA: Diagnosis not present

## 2013-12-27 DIAGNOSIS — S52023D Displaced fracture of olecranon process without intraarticular extension of unspecified ulna, subsequent encounter for closed fracture with routine healing: Secondary | ICD-10-CM | POA: Diagnosis not present

## 2013-12-27 DIAGNOSIS — I82411 Acute embolism and thrombosis of right femoral vein: Secondary | ICD-10-CM | POA: Diagnosis not present

## 2013-12-27 DIAGNOSIS — Z9181 History of falling: Secondary | ICD-10-CM | POA: Diagnosis not present

## 2013-12-29 DIAGNOSIS — I82409 Acute embolism and thrombosis of unspecified deep veins of unspecified lower extremity: Secondary | ICD-10-CM | POA: Diagnosis not present

## 2013-12-29 DIAGNOSIS — S32309A Unspecified fracture of unspecified ilium, initial encounter for closed fracture: Secondary | ICD-10-CM | POA: Diagnosis not present

## 2013-12-29 DIAGNOSIS — E46 Unspecified protein-calorie malnutrition: Secondary | ICD-10-CM | POA: Diagnosis not present

## 2013-12-29 DIAGNOSIS — M8440XA Pathological fracture, unspecified site, initial encounter for fracture: Secondary | ICD-10-CM | POA: Diagnosis not present

## 2013-12-29 DIAGNOSIS — S52123A Displaced fracture of head of unspecified radius, initial encounter for closed fracture: Secondary | ICD-10-CM | POA: Diagnosis not present

## 2013-12-30 DIAGNOSIS — S52023D Displaced fracture of olecranon process without intraarticular extension of unspecified ulna, subsequent encounter for closed fracture with routine healing: Secondary | ICD-10-CM | POA: Diagnosis not present

## 2013-12-30 DIAGNOSIS — Z4889 Encounter for other specified surgical aftercare: Secondary | ICD-10-CM | POA: Diagnosis not present

## 2013-12-30 DIAGNOSIS — S72109D Unspecified trochanteric fracture of unspecified femur, subsequent encounter for closed fracture with routine healing: Secondary | ICD-10-CM | POA: Diagnosis not present

## 2013-12-30 DIAGNOSIS — I82411 Acute embolism and thrombosis of right femoral vein: Secondary | ICD-10-CM | POA: Diagnosis not present

## 2013-12-30 DIAGNOSIS — Z9181 History of falling: Secondary | ICD-10-CM | POA: Diagnosis not present

## 2013-12-30 DIAGNOSIS — Z7901 Long term (current) use of anticoagulants: Secondary | ICD-10-CM | POA: Diagnosis not present

## 2013-12-31 DIAGNOSIS — Z9181 History of falling: Secondary | ICD-10-CM | POA: Diagnosis not present

## 2013-12-31 DIAGNOSIS — Z7901 Long term (current) use of anticoagulants: Secondary | ICD-10-CM | POA: Diagnosis not present

## 2013-12-31 DIAGNOSIS — S52023D Displaced fracture of olecranon process without intraarticular extension of unspecified ulna, subsequent encounter for closed fracture with routine healing: Secondary | ICD-10-CM | POA: Diagnosis not present

## 2013-12-31 DIAGNOSIS — I82411 Acute embolism and thrombosis of right femoral vein: Secondary | ICD-10-CM | POA: Diagnosis not present

## 2013-12-31 DIAGNOSIS — Z4889 Encounter for other specified surgical aftercare: Secondary | ICD-10-CM | POA: Diagnosis not present

## 2013-12-31 DIAGNOSIS — S72109D Unspecified trochanteric fracture of unspecified femur, subsequent encounter for closed fracture with routine healing: Secondary | ICD-10-CM | POA: Diagnosis not present

## 2014-01-02 DIAGNOSIS — S72109D Unspecified trochanteric fracture of unspecified femur, subsequent encounter for closed fracture with routine healing: Secondary | ICD-10-CM | POA: Diagnosis not present

## 2014-01-02 DIAGNOSIS — Z4889 Encounter for other specified surgical aftercare: Secondary | ICD-10-CM | POA: Diagnosis not present

## 2014-01-02 DIAGNOSIS — Z9181 History of falling: Secondary | ICD-10-CM | POA: Diagnosis not present

## 2014-01-02 DIAGNOSIS — S52023D Displaced fracture of olecranon process without intraarticular extension of unspecified ulna, subsequent encounter for closed fracture with routine healing: Secondary | ICD-10-CM | POA: Diagnosis not present

## 2014-01-02 DIAGNOSIS — I82411 Acute embolism and thrombosis of right femoral vein: Secondary | ICD-10-CM | POA: Diagnosis not present

## 2014-01-02 DIAGNOSIS — Z7901 Long term (current) use of anticoagulants: Secondary | ICD-10-CM | POA: Diagnosis not present

## 2014-01-04 DIAGNOSIS — I824Y9 Acute embolism and thrombosis of unspecified deep veins of unspecified proximal lower extremity: Secondary | ICD-10-CM | POA: Diagnosis not present

## 2014-01-04 DIAGNOSIS — S72009D Fracture of unspecified part of neck of unspecified femur, subsequent encounter for closed fracture with routine healing: Secondary | ICD-10-CM | POA: Diagnosis not present

## 2014-01-04 DIAGNOSIS — S72109D Unspecified trochanteric fracture of unspecified femur, subsequent encounter for closed fracture with routine healing: Secondary | ICD-10-CM | POA: Diagnosis not present

## 2014-01-04 DIAGNOSIS — Z4889 Encounter for other specified surgical aftercare: Secondary | ICD-10-CM | POA: Diagnosis not present

## 2014-01-04 DIAGNOSIS — Z7901 Long term (current) use of anticoagulants: Secondary | ICD-10-CM

## 2014-01-04 DIAGNOSIS — Z9181 History of falling: Secondary | ICD-10-CM

## 2014-01-04 DIAGNOSIS — I82411 Acute embolism and thrombosis of right femoral vein: Secondary | ICD-10-CM | POA: Diagnosis not present

## 2014-01-04 DIAGNOSIS — S5290XD Unspecified fracture of unspecified forearm, subsequent encounter for closed fracture with routine healing: Secondary | ICD-10-CM | POA: Diagnosis not present

## 2014-01-04 DIAGNOSIS — S52023D Displaced fracture of olecranon process without intraarticular extension of unspecified ulna, subsequent encounter for closed fracture with routine healing: Secondary | ICD-10-CM | POA: Diagnosis not present

## 2014-01-05 DIAGNOSIS — S72109D Unspecified trochanteric fracture of unspecified femur, subsequent encounter for closed fracture with routine healing: Secondary | ICD-10-CM | POA: Diagnosis not present

## 2014-01-05 DIAGNOSIS — Z7901 Long term (current) use of anticoagulants: Secondary | ICD-10-CM | POA: Diagnosis not present

## 2014-01-05 DIAGNOSIS — S52023D Displaced fracture of olecranon process without intraarticular extension of unspecified ulna, subsequent encounter for closed fracture with routine healing: Secondary | ICD-10-CM | POA: Diagnosis not present

## 2014-01-05 DIAGNOSIS — Z9181 History of falling: Secondary | ICD-10-CM | POA: Diagnosis not present

## 2014-01-05 DIAGNOSIS — Z4889 Encounter for other specified surgical aftercare: Secondary | ICD-10-CM | POA: Diagnosis not present

## 2014-01-05 DIAGNOSIS — I82411 Acute embolism and thrombosis of right femoral vein: Secondary | ICD-10-CM | POA: Diagnosis not present

## 2014-01-06 DIAGNOSIS — Z9181 History of falling: Secondary | ICD-10-CM | POA: Diagnosis not present

## 2014-01-06 DIAGNOSIS — S72109D Unspecified trochanteric fracture of unspecified femur, subsequent encounter for closed fracture with routine healing: Secondary | ICD-10-CM | POA: Diagnosis not present

## 2014-01-06 DIAGNOSIS — I82411 Acute embolism and thrombosis of right femoral vein: Secondary | ICD-10-CM | POA: Diagnosis not present

## 2014-01-06 DIAGNOSIS — Z7901 Long term (current) use of anticoagulants: Secondary | ICD-10-CM | POA: Diagnosis not present

## 2014-01-06 DIAGNOSIS — Z4889 Encounter for other specified surgical aftercare: Secondary | ICD-10-CM | POA: Diagnosis not present

## 2014-01-06 DIAGNOSIS — S52023D Displaced fracture of olecranon process without intraarticular extension of unspecified ulna, subsequent encounter for closed fracture with routine healing: Secondary | ICD-10-CM | POA: Diagnosis not present

## 2014-01-10 DIAGNOSIS — Z9181 History of falling: Secondary | ICD-10-CM | POA: Diagnosis not present

## 2014-01-10 DIAGNOSIS — I82411 Acute embolism and thrombosis of right femoral vein: Secondary | ICD-10-CM | POA: Diagnosis not present

## 2014-01-10 DIAGNOSIS — Z4889 Encounter for other specified surgical aftercare: Secondary | ICD-10-CM | POA: Diagnosis not present

## 2014-01-10 DIAGNOSIS — Z7901 Long term (current) use of anticoagulants: Secondary | ICD-10-CM | POA: Diagnosis not present

## 2014-01-10 DIAGNOSIS — S72109D Unspecified trochanteric fracture of unspecified femur, subsequent encounter for closed fracture with routine healing: Secondary | ICD-10-CM | POA: Diagnosis not present

## 2014-01-10 DIAGNOSIS — S52023D Displaced fracture of olecranon process without intraarticular extension of unspecified ulna, subsequent encounter for closed fracture with routine healing: Secondary | ICD-10-CM | POA: Diagnosis not present

## 2014-01-12 DIAGNOSIS — Z9181 History of falling: Secondary | ICD-10-CM | POA: Diagnosis not present

## 2014-01-12 DIAGNOSIS — S52023D Displaced fracture of olecranon process without intraarticular extension of unspecified ulna, subsequent encounter for closed fracture with routine healing: Secondary | ICD-10-CM | POA: Diagnosis not present

## 2014-01-12 DIAGNOSIS — I82411 Acute embolism and thrombosis of right femoral vein: Secondary | ICD-10-CM | POA: Diagnosis not present

## 2014-01-12 DIAGNOSIS — S72109D Unspecified trochanteric fracture of unspecified femur, subsequent encounter for closed fracture with routine healing: Secondary | ICD-10-CM | POA: Diagnosis not present

## 2014-01-12 DIAGNOSIS — Z7901 Long term (current) use of anticoagulants: Secondary | ICD-10-CM | POA: Diagnosis not present

## 2014-01-12 DIAGNOSIS — Z4889 Encounter for other specified surgical aftercare: Secondary | ICD-10-CM | POA: Diagnosis not present

## 2014-01-13 DIAGNOSIS — Z4889 Encounter for other specified surgical aftercare: Secondary | ICD-10-CM | POA: Diagnosis not present

## 2014-01-13 DIAGNOSIS — Z7901 Long term (current) use of anticoagulants: Secondary | ICD-10-CM | POA: Diagnosis not present

## 2014-01-13 DIAGNOSIS — Z9181 History of falling: Secondary | ICD-10-CM | POA: Diagnosis not present

## 2014-01-13 DIAGNOSIS — I82411 Acute embolism and thrombosis of right femoral vein: Secondary | ICD-10-CM | POA: Diagnosis not present

## 2014-01-13 DIAGNOSIS — S52023D Displaced fracture of olecranon process without intraarticular extension of unspecified ulna, subsequent encounter for closed fracture with routine healing: Secondary | ICD-10-CM | POA: Diagnosis not present

## 2014-01-13 DIAGNOSIS — S72109D Unspecified trochanteric fracture of unspecified femur, subsequent encounter for closed fracture with routine healing: Secondary | ICD-10-CM | POA: Diagnosis not present

## 2014-01-17 DIAGNOSIS — S72109D Unspecified trochanteric fracture of unspecified femur, subsequent encounter for closed fracture with routine healing: Secondary | ICD-10-CM | POA: Diagnosis not present

## 2014-01-17 DIAGNOSIS — S52023D Displaced fracture of olecranon process without intraarticular extension of unspecified ulna, subsequent encounter for closed fracture with routine healing: Secondary | ICD-10-CM | POA: Diagnosis not present

## 2014-01-17 DIAGNOSIS — Z7901 Long term (current) use of anticoagulants: Secondary | ICD-10-CM | POA: Diagnosis not present

## 2014-01-17 DIAGNOSIS — I82411 Acute embolism and thrombosis of right femoral vein: Secondary | ICD-10-CM | POA: Diagnosis not present

## 2014-01-17 DIAGNOSIS — Z4889 Encounter for other specified surgical aftercare: Secondary | ICD-10-CM | POA: Diagnosis not present

## 2014-01-17 DIAGNOSIS — Z9181 History of falling: Secondary | ICD-10-CM | POA: Diagnosis not present

## 2014-01-19 DIAGNOSIS — M25559 Pain in unspecified hip: Secondary | ICD-10-CM | POA: Diagnosis not present

## 2014-01-19 DIAGNOSIS — S52599A Other fractures of lower end of unspecified radius, initial encounter for closed fracture: Secondary | ICD-10-CM | POA: Diagnosis not present

## 2014-01-20 DIAGNOSIS — S72109D Unspecified trochanteric fracture of unspecified femur, subsequent encounter for closed fracture with routine healing: Secondary | ICD-10-CM | POA: Diagnosis not present

## 2014-01-20 DIAGNOSIS — Z4889 Encounter for other specified surgical aftercare: Secondary | ICD-10-CM | POA: Diagnosis not present

## 2014-01-20 DIAGNOSIS — Z7901 Long term (current) use of anticoagulants: Secondary | ICD-10-CM | POA: Diagnosis not present

## 2014-01-20 DIAGNOSIS — S52023D Displaced fracture of olecranon process without intraarticular extension of unspecified ulna, subsequent encounter for closed fracture with routine healing: Secondary | ICD-10-CM | POA: Diagnosis not present

## 2014-01-20 DIAGNOSIS — I82411 Acute embolism and thrombosis of right femoral vein: Secondary | ICD-10-CM | POA: Diagnosis not present

## 2014-01-20 DIAGNOSIS — Z9181 History of falling: Secondary | ICD-10-CM | POA: Diagnosis not present

## 2014-02-03 DIAGNOSIS — S52023D Displaced fracture of olecranon process without intraarticular extension of unspecified ulna, subsequent encounter for closed fracture with routine healing: Secondary | ICD-10-CM | POA: Diagnosis not present

## 2014-02-03 DIAGNOSIS — Z7901 Long term (current) use of anticoagulants: Secondary | ICD-10-CM | POA: Diagnosis not present

## 2014-02-03 DIAGNOSIS — I82411 Acute embolism and thrombosis of right femoral vein: Secondary | ICD-10-CM | POA: Diagnosis not present

## 2014-02-03 DIAGNOSIS — Z4889 Encounter for other specified surgical aftercare: Secondary | ICD-10-CM | POA: Diagnosis not present

## 2014-02-03 DIAGNOSIS — S72109D Unspecified trochanteric fracture of unspecified femur, subsequent encounter for closed fracture with routine healing: Secondary | ICD-10-CM | POA: Diagnosis not present

## 2014-02-03 DIAGNOSIS — Z9181 History of falling: Secondary | ICD-10-CM | POA: Diagnosis not present

## 2014-02-16 DIAGNOSIS — M19131 Post-traumatic osteoarthritis, right wrist: Secondary | ICD-10-CM | POA: Diagnosis not present

## 2014-02-17 DIAGNOSIS — Z9181 History of falling: Secondary | ICD-10-CM | POA: Diagnosis not present

## 2014-02-17 DIAGNOSIS — Z7901 Long term (current) use of anticoagulants: Secondary | ICD-10-CM | POA: Diagnosis not present

## 2014-02-17 DIAGNOSIS — S52023D Displaced fracture of olecranon process without intraarticular extension of unspecified ulna, subsequent encounter for closed fracture with routine healing: Secondary | ICD-10-CM | POA: Diagnosis not present

## 2014-02-17 DIAGNOSIS — Z4889 Encounter for other specified surgical aftercare: Secondary | ICD-10-CM | POA: Diagnosis not present

## 2014-02-17 DIAGNOSIS — I82411 Acute embolism and thrombosis of right femoral vein: Secondary | ICD-10-CM | POA: Diagnosis not present

## 2014-02-17 DIAGNOSIS — S72109D Unspecified trochanteric fracture of unspecified femur, subsequent encounter for closed fracture with routine healing: Secondary | ICD-10-CM | POA: Diagnosis not present

## 2014-02-23 DIAGNOSIS — E46 Unspecified protein-calorie malnutrition: Secondary | ICD-10-CM | POA: Diagnosis not present

## 2014-02-23 DIAGNOSIS — M109 Gout, unspecified: Secondary | ICD-10-CM | POA: Diagnosis not present

## 2014-02-23 DIAGNOSIS — M8460XA Pathological fracture in other disease, unspecified site, initial encounter for fracture: Secondary | ICD-10-CM | POA: Diagnosis not present

## 2014-02-23 DIAGNOSIS — I82401 Acute embolism and thrombosis of unspecified deep veins of right lower extremity: Secondary | ICD-10-CM | POA: Diagnosis not present

## 2014-02-23 DIAGNOSIS — Z23 Encounter for immunization: Secondary | ICD-10-CM | POA: Diagnosis not present

## 2014-03-02 DIAGNOSIS — I82401 Acute embolism and thrombosis of unspecified deep veins of right lower extremity: Secondary | ICD-10-CM | POA: Diagnosis not present

## 2014-03-08 DIAGNOSIS — B079 Viral wart, unspecified: Secondary | ICD-10-CM | POA: Diagnosis not present

## 2014-03-08 DIAGNOSIS — D485 Neoplasm of uncertain behavior of skin: Secondary | ICD-10-CM | POA: Diagnosis not present

## 2014-03-16 DIAGNOSIS — I82401 Acute embolism and thrombosis of unspecified deep veins of right lower extremity: Secondary | ICD-10-CM | POA: Diagnosis not present

## 2014-03-30 DIAGNOSIS — Z7901 Long term (current) use of anticoagulants: Secondary | ICD-10-CM | POA: Diagnosis not present

## 2014-04-27 DIAGNOSIS — I82401 Acute embolism and thrombosis of unspecified deep veins of right lower extremity: Secondary | ICD-10-CM | POA: Diagnosis not present

## 2014-05-18 DIAGNOSIS — I82401 Acute embolism and thrombosis of unspecified deep veins of right lower extremity: Secondary | ICD-10-CM | POA: Diagnosis not present

## 2014-05-18 DIAGNOSIS — E46 Unspecified protein-calorie malnutrition: Secondary | ICD-10-CM | POA: Diagnosis not present

## 2014-05-19 DIAGNOSIS — H903 Sensorineural hearing loss, bilateral: Secondary | ICD-10-CM | POA: Diagnosis not present

## 2014-05-19 DIAGNOSIS — H9011 Conductive hearing loss, unilateral, right ear, with unrestricted hearing on the contralateral side: Secondary | ICD-10-CM | POA: Diagnosis not present

## 2014-05-26 ENCOUNTER — Encounter (HOSPITAL_COMMUNITY): Payer: Self-pay | Admitting: Orthopedic Surgery

## 2014-06-07 DIAGNOSIS — R109 Unspecified abdominal pain: Secondary | ICD-10-CM | POA: Diagnosis not present

## 2014-06-07 DIAGNOSIS — R350 Frequency of micturition: Secondary | ICD-10-CM | POA: Diagnosis not present

## 2014-06-17 DIAGNOSIS — H40013 Open angle with borderline findings, low risk, bilateral: Secondary | ICD-10-CM | POA: Diagnosis not present

## 2014-06-17 DIAGNOSIS — H25811 Combined forms of age-related cataract, right eye: Secondary | ICD-10-CM | POA: Diagnosis not present

## 2014-06-17 DIAGNOSIS — H3531 Nonexudative age-related macular degeneration: Secondary | ICD-10-CM | POA: Diagnosis not present

## 2014-07-05 DIAGNOSIS — H2511 Age-related nuclear cataract, right eye: Secondary | ICD-10-CM | POA: Diagnosis not present

## 2014-07-11 DIAGNOSIS — H2511 Age-related nuclear cataract, right eye: Secondary | ICD-10-CM | POA: Diagnosis not present

## 2014-07-11 DIAGNOSIS — H268 Other specified cataract: Secondary | ICD-10-CM | POA: Diagnosis not present

## 2014-07-15 ENCOUNTER — Other Ambulatory Visit: Payer: Self-pay | Admitting: Family Medicine

## 2014-07-15 ENCOUNTER — Ambulatory Visit
Admission: RE | Admit: 2014-07-15 | Discharge: 2014-07-15 | Disposition: A | Discharge status: Home / Self Care | Payer: Medicare Other | Source: Ambulatory Visit | Attending: Family Medicine | Admitting: Family Medicine

## 2014-07-15 DIAGNOSIS — S8991XA Unspecified injury of right lower leg, initial encounter: Secondary | ICD-10-CM | POA: Diagnosis not present

## 2014-07-15 DIAGNOSIS — M7989 Other specified soft tissue disorders: Secondary | ICD-10-CM | POA: Diagnosis not present

## 2014-07-15 DIAGNOSIS — M25512 Pain in left shoulder: Secondary | ICD-10-CM | POA: Diagnosis not present

## 2014-07-15 DIAGNOSIS — F0390 Unspecified dementia without behavioral disturbance: Secondary | ICD-10-CM | POA: Diagnosis not present

## 2014-07-15 DIAGNOSIS — W19XXXA Unspecified fall, initial encounter: Secondary | ICD-10-CM

## 2014-07-15 DIAGNOSIS — M25561 Pain in right knee: Secondary | ICD-10-CM | POA: Diagnosis not present

## 2014-07-15 DIAGNOSIS — T148 Other injury of unspecified body region: Secondary | ICD-10-CM | POA: Diagnosis not present

## 2014-07-15 DIAGNOSIS — S40012A Contusion of left shoulder, initial encounter: Secondary | ICD-10-CM | POA: Diagnosis not present

## 2014-11-16 DIAGNOSIS — E46 Unspecified protein-calorie malnutrition: Secondary | ICD-10-CM | POA: Diagnosis not present

## 2014-11-16 DIAGNOSIS — F0390 Unspecified dementia without behavioral disturbance: Secondary | ICD-10-CM | POA: Diagnosis not present

## 2014-11-16 DIAGNOSIS — M810 Age-related osteoporosis without current pathological fracture: Secondary | ICD-10-CM | POA: Diagnosis not present

## 2015-02-21 DIAGNOSIS — Z23 Encounter for immunization: Secondary | ICD-10-CM | POA: Diagnosis not present

## 2015-05-19 DIAGNOSIS — F0390 Unspecified dementia without behavioral disturbance: Secondary | ICD-10-CM | POA: Diagnosis not present

## 2015-05-19 DIAGNOSIS — E46 Unspecified protein-calorie malnutrition: Secondary | ICD-10-CM | POA: Diagnosis not present

## 2015-05-19 DIAGNOSIS — M8460XA Pathological fracture in other disease, unspecified site, initial encounter for fracture: Secondary | ICD-10-CM | POA: Diagnosis not present

## 2015-05-19 DIAGNOSIS — M810 Age-related osteoporosis without current pathological fracture: Secondary | ICD-10-CM | POA: Diagnosis not present

## 2015-05-19 DIAGNOSIS — E559 Vitamin D deficiency, unspecified: Secondary | ICD-10-CM | POA: Diagnosis not present

## 2015-09-18 DIAGNOSIS — M810 Age-related osteoporosis without current pathological fracture: Secondary | ICD-10-CM | POA: Diagnosis not present

## 2015-09-18 DIAGNOSIS — F0390 Unspecified dementia without behavioral disturbance: Secondary | ICD-10-CM | POA: Diagnosis not present

## 2015-09-18 DIAGNOSIS — E46 Unspecified protein-calorie malnutrition: Secondary | ICD-10-CM | POA: Diagnosis not present

## 2015-09-18 DIAGNOSIS — M8460XA Pathological fracture in other disease, unspecified site, initial encounter for fracture: Secondary | ICD-10-CM | POA: Diagnosis not present

## 2015-09-18 DIAGNOSIS — E559 Vitamin D deficiency, unspecified: Secondary | ICD-10-CM | POA: Diagnosis not present

## 2016-01-12 IMAGING — CR DG HIP COMPLETE 2+V*R*
3 series · 3 of 3 positions shown · non-contrast
Comparison: Radiographs and CT 12/04/2013.

CLINICAL DATA: Right hip pain. Follow-up greater trochanteric
fracture.

EXAM:
RIGHT HIP - COMPLETE 2+ VIEW

[t pelvis ap]
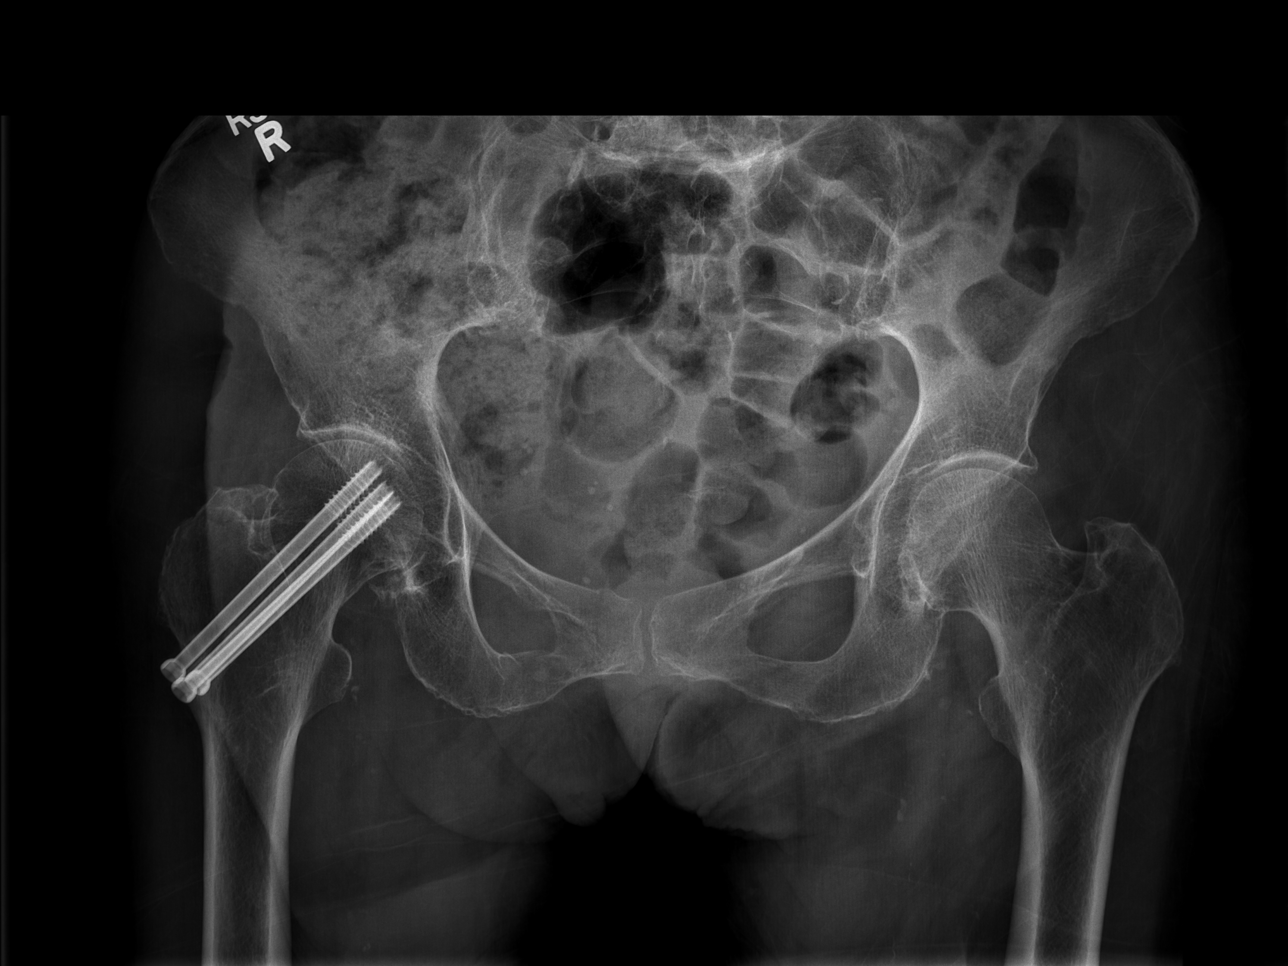

[t hip ap right]
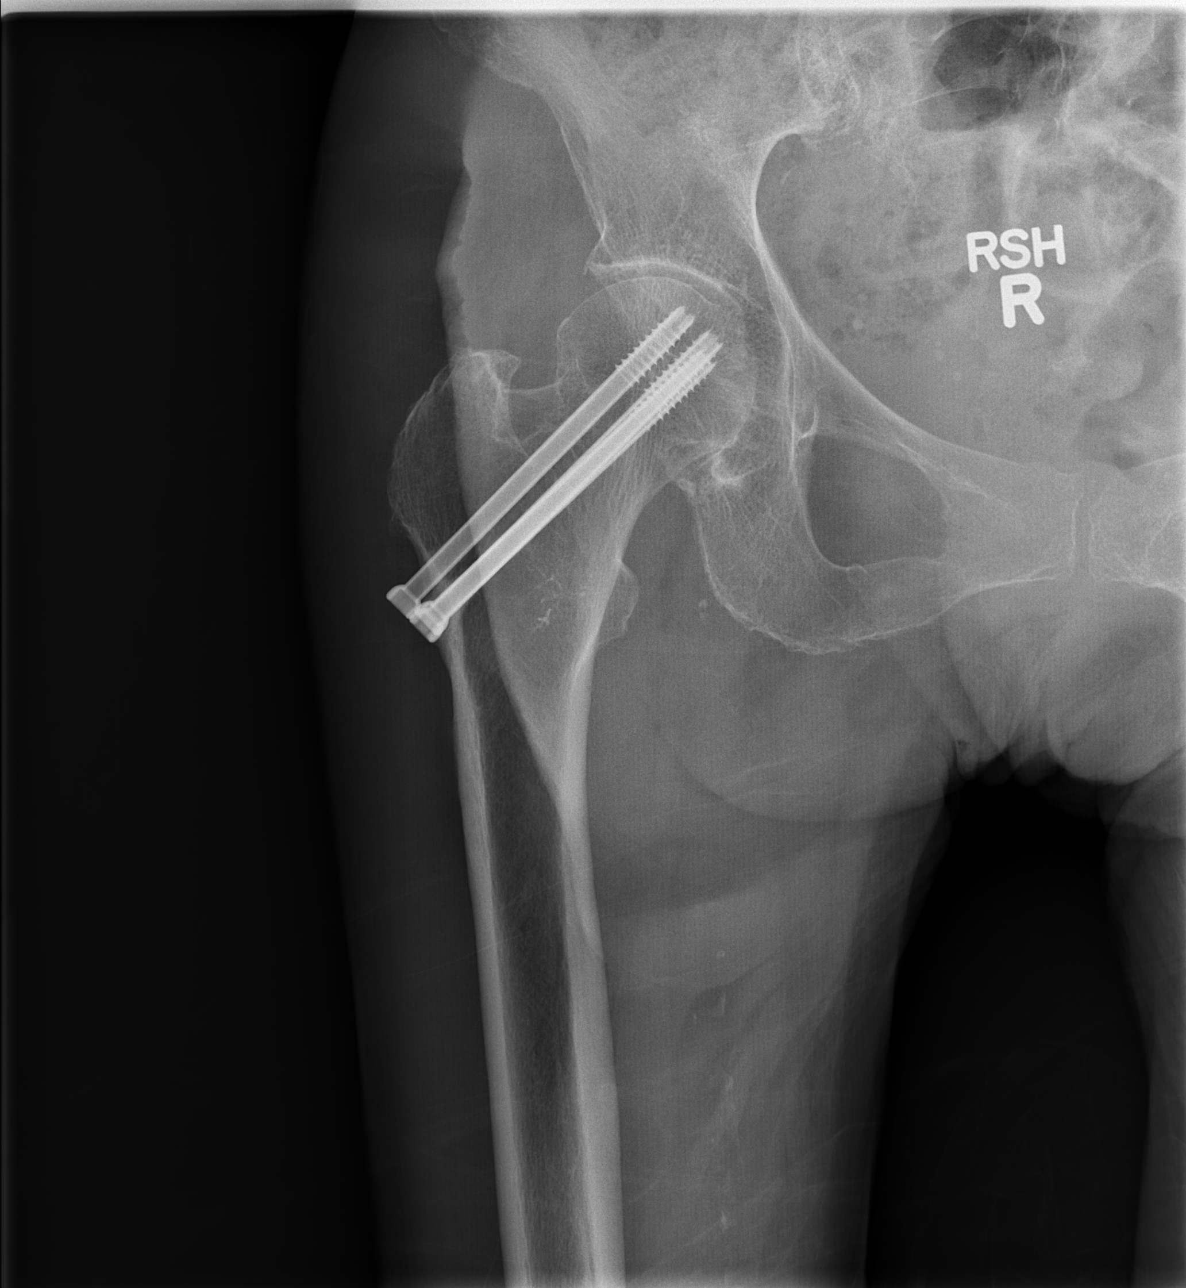

[t hip frog leg right]
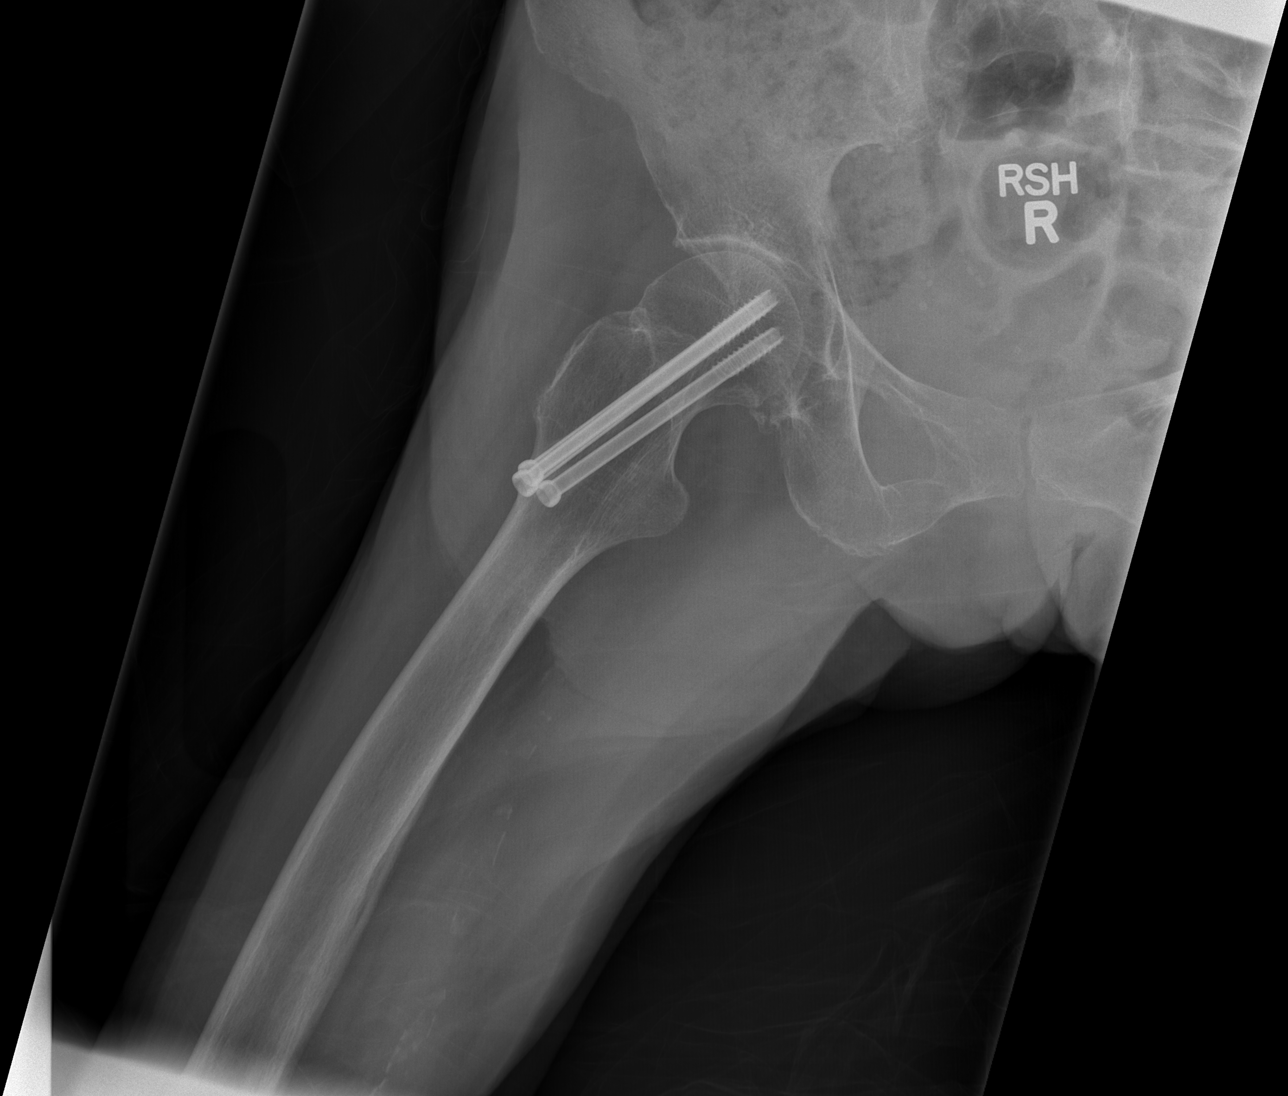

[3 of 3 positions shown; findings below may reference images not displayed]

FINDINGS: The bones are demineralized. There is no hardware displacement
status post screw fixation of a subcapital fracture. The
posttraumatic deformity of the femoral neck is stable. The
nondisplaced fracture of the greater trochanter demonstrated on CT
is not well visualized, but appears stable. No new fractures are
identified.
IMPRESSION: Stable nondisplaced fracture of the right femoral greater
trochanter.

## 2016-03-11 DIAGNOSIS — M545 Low back pain: Secondary | ICD-10-CM | POA: Diagnosis not present

## 2016-03-19 DIAGNOSIS — M545 Low back pain: Secondary | ICD-10-CM | POA: Diagnosis not present

## 2016-03-22 DIAGNOSIS — S32030A Wedge compression fracture of third lumbar vertebra, initial encounter for closed fracture: Secondary | ICD-10-CM | POA: Diagnosis not present

## 2016-03-22 DIAGNOSIS — S32040A Wedge compression fracture of fourth lumbar vertebra, initial encounter for closed fracture: Secondary | ICD-10-CM | POA: Diagnosis not present

## 2016-03-22 DIAGNOSIS — S32010A Wedge compression fracture of first lumbar vertebra, initial encounter for closed fracture: Secondary | ICD-10-CM | POA: Diagnosis not present

## 2016-03-27 DIAGNOSIS — M810 Age-related osteoporosis without current pathological fracture: Secondary | ICD-10-CM | POA: Diagnosis not present

## 2016-03-27 DIAGNOSIS — M8460XA Pathological fracture in other disease, unspecified site, initial encounter for fracture: Secondary | ICD-10-CM | POA: Diagnosis not present

## 2016-03-27 DIAGNOSIS — Z23 Encounter for immunization: Secondary | ICD-10-CM | POA: Diagnosis not present

## 2016-04-08 ENCOUNTER — Other Ambulatory Visit: Payer: Self-pay | Admitting: Orthopedic Surgery

## 2016-04-08 DIAGNOSIS — S32008A Other fracture of unspecified lumbar vertebra, initial encounter for closed fracture: Secondary | ICD-10-CM | POA: Diagnosis not present

## 2016-04-08 DIAGNOSIS — M545 Low back pain: Secondary | ICD-10-CM | POA: Diagnosis not present

## 2016-04-10 ENCOUNTER — Encounter (HOSPITAL_COMMUNITY): Payer: Self-pay | Admitting: *Deleted

## 2016-04-10 NOTE — H&P (Signed)
PREOPERATIVE H&P  Chief Complaint: Low back pain  HPI: Vanessa Pace is a 82 y.o. female who presents with ongoing pain in the low back  MRI reveals edema within the L1, L3, and L4 vertebral bodies  Patient has failed multiple forms of conservative care and continues to have pain (see office notes for additional details regarding the patient's full course of treatment)  Past Medical History:  Diagnosis Date  . Arthritis   . Cancer ([place])    skin/face  . Compression fracture of L1 lumbar vertebra ([facility])    L3 and L4  . Dementia   . DVT (deep venous thrombosis) ([place])   . Elbow fracture 12/04/2013   rt elbow  . Family history of anesthesia complication    Daughter is difficult to intubate   . GERD (gastroesophageal reflux disease)   . Macular degeneration   . PONV (postoperative nausea and vomiting)    Past Surgical History:  Procedure Laterality Date  . cataracts     B/L  . COLONOSCOPY W/ BIOPSIES AND POLYPECTOMY    . femur surgery    . FRACTURE SURGERY     right hip  . HIP SURGERY    . ORIF ELBOW FRACTURE Right 12/06/2013   Procedure: OPEN REDUCTION INTERNAL FIXATION (ORIF) ELBOW/OLECRANON FRACTURE;  Surgeon: Jolyn Nap, MD;  Location: [facility];  Service: Orthopedics;  Laterality: Right;   Social History   Social History  . Marital status: Single    Spouse name: N/A  . Number of children: N/A  . Years of education: N/A   Social History Main Topics  . Smoking status: Never Smoker  . Smokeless tobacco: Never Used     Comment: exposed to 2nd hand smoke   . Alcohol use Yes     Comment: occasionally  . Drug use: No  . Sexual activity: Not Asked   Other Topics Concern  . None   Social History Narrative  . None   Family History  Problem Relation Age of Onset  . CAD Father   . CAD Son   . Cervical cancer Daughter    Allergies  Allergen Reactions  . Adhesive [Tape] Other (See Comments)    Tears skin  . Amoxicillin Hives and Rash    Has patient  had a PCN reaction causing immediate rash, facial/tongue/throat swelling, SOB or lightheadedness with hypotension: Yes Has patient had a PCN reaction causing severe rash involving mucus membranes or skin necrosis: Yes Has patient had a PCN reaction that required hospitalization No Has patient had a PCN reaction occurring within the last 10 years: No If all of the above answers are "NO", then may proceed with Cephalosporin use.   [name] Kitchen Hydrocodone Nausea Only   Prior to Admission medications   Medication Sig Start Date End Date Taking? Authorizing Provider  diclofenac sodium (VOLTAREN) 1 % GEL Apply 2 g topically 3 (three) times daily. Patient taking differently: Apply 2 g topically 3 (three) times daily as needed (pain).  12/21/13  Yes Daniel J Angiulli, PA-C  DiphenhydrAMINE HCl, Sleep, (UNISOM SLEEPGELS) 50 MG CAPS Take 50 mg by mouth at bedtime.   Yes Historical Provider, MD  Multiple Vitamins-Minerals (ICAPS AREDS 2 PO) Take 1 capsule by mouth 3 (three) times a week.   Yes Historical Provider, MD  polyethylene glycol (MIRALAX / GLYCOLAX) packet Take 17 g by mouth daily. Patient taking differently: Take 17 g by mouth daily as needed for mild constipation.  12/09/13  Yes Nayana  Abrol, MD     All other systems have been reviewed and were otherwise negative with the exception of those mentioned in the HPI and as above.  Physical Exam: There were no vitals filed for this visit.  General: Alert, no acute distress Cardiovascular: No pedal edema Respiratory: No cyanosis, no use of accessory musculature Skin: No lesions in the area of chief complaint Neurologic: Sensation intact distally Psychiatric: Patient is competent for consent with normal mood and affect Lymphatic: No axillary or cervical lymphadenopathy  MUSCULOSKELETAL: + TTP low back  Assessment/Plan: Compression fractures at L1, L3, L4 Plan for Procedure(s): LUMBAR1, LUMBAR 3, LUMBAR 4 KYPHOPLASTY   Sinclair Ship,  MD 04/10/2016 5:03 PM

## 2016-04-10 NOTE — Progress Notes (Signed)
Pt SDW-pre-op call completed by py daughter Vanessa Pace with pt verbal consent. Daughter denies that mother C/O SOB and chest pain. Daughter denies pt is under the care of a cardiologist. Daughter stated that pt had a stress test > 15 years ago but denies that pt had a cardiac cath and echo. Daughter denies that pt had a chest x ray and EKG within the last year. Daughter made aware to have pt stop taking  Aspirin, vitamins, fish oil and herbal medications. Do not take any NSAIDs ie: Ibuprofen, Advil, Naproxen, BC and Goody Powder or any medication containing Aspirin such as Voltaren. Daughter verbalized understanding of all pre-op instructions.

## 2016-04-11 ENCOUNTER — Ambulatory Visit (HOSPITAL_COMMUNITY): Payer: Medicare Other

## 2016-04-11 ENCOUNTER — Ambulatory Visit (HOSPITAL_COMMUNITY): Payer: Medicare Other | Admitting: Certified Registered Nurse Anesthetist

## 2016-04-11 ENCOUNTER — Encounter (HOSPITAL_COMMUNITY)
Admission: RE | Disposition: A | Discharge status: Home / Self Care | Payer: Self-pay | Source: Ambulatory Visit | Attending: Orthopedic Surgery

## 2016-04-11 ENCOUNTER — Ambulatory Visit (HOSPITAL_COMMUNITY)
Admission: RE | Admit: 2016-04-11 | Discharge: 2016-04-11 | Disposition: A | Discharge status: Home / Self Care | Payer: Medicare Other | Source: Ambulatory Visit | Attending: Orthopedic Surgery | Admitting: Orthopedic Surgery

## 2016-04-11 ENCOUNTER — Encounter (HOSPITAL_COMMUNITY): Payer: Self-pay | Admitting: *Deleted

## 2016-04-11 DIAGNOSIS — M1990 Unspecified osteoarthritis, unspecified site: Secondary | ICD-10-CM | POA: Diagnosis not present

## 2016-04-11 DIAGNOSIS — S32010A Wedge compression fracture of first lumbar vertebra, initial encounter for closed fracture: Secondary | ICD-10-CM | POA: Diagnosis not present

## 2016-04-11 DIAGNOSIS — Z888 Allergy status to other drugs, medicaments and biological substances status: Secondary | ICD-10-CM | POA: Insufficient documentation

## 2016-04-11 DIAGNOSIS — Z8249 Family history of ischemic heart disease and other diseases of the circulatory system: Secondary | ICD-10-CM | POA: Diagnosis not present

## 2016-04-11 DIAGNOSIS — I10 Essential (primary) hypertension: Secondary | ICD-10-CM | POA: Insufficient documentation

## 2016-04-11 DIAGNOSIS — Z880 Allergy status to penicillin: Secondary | ICD-10-CM | POA: Diagnosis not present

## 2016-04-11 DIAGNOSIS — K219 Gastro-esophageal reflux disease without esophagitis: Secondary | ICD-10-CM | POA: Diagnosis not present

## 2016-04-11 DIAGNOSIS — Z885 Allergy status to narcotic agent status: Secondary | ICD-10-CM | POA: Diagnosis not present

## 2016-04-11 DIAGNOSIS — S32030A Wedge compression fracture of third lumbar vertebra, initial encounter for closed fracture: Secondary | ICD-10-CM | POA: Diagnosis not present

## 2016-04-11 DIAGNOSIS — Z85828 Personal history of other malignant neoplasm of skin: Secondary | ICD-10-CM | POA: Diagnosis not present

## 2016-04-11 DIAGNOSIS — Z01818 Encounter for other preprocedural examination: Secondary | ICD-10-CM

## 2016-04-11 DIAGNOSIS — R011 Cardiac murmur, unspecified: Secondary | ICD-10-CM | POA: Diagnosis not present

## 2016-04-11 DIAGNOSIS — M4856XA Collapsed vertebra, not elsewhere classified, lumbar region, initial encounter for fracture: Secondary | ICD-10-CM | POA: Diagnosis not present

## 2016-04-11 DIAGNOSIS — F0390 Unspecified dementia without behavioral disturbance: Secondary | ICD-10-CM | POA: Insufficient documentation

## 2016-04-11 DIAGNOSIS — Z86718 Personal history of other venous thrombosis and embolism: Secondary | ICD-10-CM | POA: Diagnosis not present

## 2016-04-11 DIAGNOSIS — H3530 Unspecified macular degeneration: Secondary | ICD-10-CM | POA: Diagnosis not present

## 2016-04-11 DIAGNOSIS — S52021A Displaced fracture of olecranon process without intraarticular extension of right ulna, initial encounter for closed fracture: Secondary | ICD-10-CM | POA: Diagnosis not present

## 2016-04-11 DIAGNOSIS — S32040A Wedge compression fracture of fourth lumbar vertebra, initial encounter for closed fracture: Secondary | ICD-10-CM | POA: Diagnosis not present

## 2016-04-11 DIAGNOSIS — S32000A Wedge compression fracture of unspecified lumbar vertebra, initial encounter for closed fracture: Secondary | ICD-10-CM

## 2016-04-11 DIAGNOSIS — M4326 Fusion of spine, lumbar region: Secondary | ICD-10-CM | POA: Diagnosis not present

## 2016-04-11 DIAGNOSIS — S72009A Fracture of unspecified part of neck of unspecified femur, initial encounter for closed fracture: Secondary | ICD-10-CM | POA: Diagnosis not present

## 2016-04-11 HISTORY — DX: Nausea with vomiting, unspecified: R11.2

## 2016-04-11 HISTORY — DX: Other specified postprocedural states: Z98.890

## 2016-04-11 HISTORY — DX: Malignant (primary) neoplasm, unspecified: C80.1

## 2016-04-11 HISTORY — DX: Unspecified dementia, unspecified severity, without behavioral disturbance, psychotic disturbance, mood disturbance, and anxiety: F03.90

## 2016-04-11 HISTORY — DX: Acute embolism and thrombosis of unspecified deep veins of unspecified lower extremity: I82.409

## 2016-04-11 HISTORY — DX: Wedge compression fracture of first lumbar vertebra, initial encounter for closed fracture: S32.010A

## 2016-04-11 HISTORY — DX: Gastro-esophageal reflux disease without esophagitis: K21.9

## 2016-04-11 HISTORY — PX: KYPHOPLASTY: SHX5884

## 2016-04-11 HISTORY — DX: Unspecified macular degeneration: H35.30

## 2016-04-11 LAB — COMPREHENSIVE METABOLIC PANEL
ALBUMIN: 3.6 g/dL (ref 3.5–5.0)
ALK PHOS: 109 U/L (ref 38–126)
ALT: 13 U/L — AB (ref 14–54)
AST: 23 U/L (ref 15–41)
Anion gap: 11 (ref 5–15)
BUN: 10 mg/dL (ref 6–20)
CHLORIDE: 101 mmol/L (ref 101–111)
CO2: 25 mmol/L (ref 22–32)
CREATININE: 0.76 mg/dL (ref 0.44–1.00)
Calcium: 9.6 mg/dL (ref 8.9–10.3)
GFR calc non Af Amer: >60 mL/min (ref >60)
GLUCOSE: 91 mg/dL (ref 65–99)
Potassium: 3.8 mmol/L (ref 3.5–5.1)
SODIUM: 137 mmol/L (ref 135–145)
Total Bilirubin: 0.7 mg/dL (ref 0.3–1.2)
Total Protein: 7 g/dL (ref 6.5–8.1)

## 2016-04-11 LAB — SURGICAL PCR SCREEN
MRSA, PCR: NEGATIVE
Staphylococcus aureus: NEGATIVE

## 2016-04-11 LAB — CBC WITH DIFFERENTIAL/PLATELET
BASOS ABS: 0 10*3/uL (ref 0.0–0.1)
BASOS PCT: 0 %
EOS ABS: 0.1 10*3/uL (ref 0.0–0.7)
EOS PCT: 2 %
HCT: 40.2 % (ref 36.0–46.0)
HEMOGLOBIN: 13.2 g/dL (ref 12.0–15.0)
Lymphocytes Relative: 13 %
Lymphs Abs: 1.2 10*3/uL (ref 0.7–4.0)
MCH: 30.2 pg (ref 26.0–34.0)
MCHC: 32.8 g/dL (ref 30.0–36.0)
MCV: 92 fL (ref 78.0–100.0)
Monocytes Absolute: 0.8 10*3/uL (ref 0.1–1.0)
Monocytes Relative: 9 %
NEUTROS PCT: 76 %
Neutro Abs: 6.8 10*3/uL (ref 1.7–7.7)
PLATELETS: 260 10*3/uL (ref 150–400)
RBC: 4.37 MIL/uL (ref 3.87–5.11)
RDW: 12.9 % (ref 11.5–15.5)
WBC: 9 10*3/uL (ref 4.0–10.5)

## 2016-04-11 LAB — URINALYSIS, ROUTINE W REFLEX MICROSCOPIC
Bilirubin Urine: NEGATIVE
GLUCOSE, UA: NEGATIVE mg/dL
Ketones, ur: NEGATIVE mg/dL
Nitrite: POSITIVE — AB
PROTEIN: NEGATIVE mg/dL
SPECIFIC GRAVITY, URINE: 1.008 (ref 1.005–1.030)
pH: 7 (ref 5.0–8.0)

## 2016-04-11 LAB — URINE MICROSCOPIC-ADD ON

## 2016-04-11 LAB — PROTIME-INR
INR: 1.07
Prothrombin Time: 14 seconds (ref 11.4–15.2)

## 2016-04-11 LAB — APTT: APTT: 36 s (ref 24–36)

## 2016-04-11 SURGERY — KYPHOPLASTY
Anesthesia: General | Site: Back

## 2016-04-11 MED ORDER — PHENYLEPHRINE 40 MCG/ML (10ML) SYRINGE FOR IV PUSH (FOR BLOOD PRESSURE SUPPORT)
PREFILLED_SYRINGE | INTRAVENOUS | Status: DC | PRN
  Administered 2016-04-11 (×4): 80 ug via INTRAVENOUS

## 2016-04-11 MED ORDER — LIDOCAINE 2% (20 MG/ML) 5 ML SYRINGE
INTRAMUSCULAR | Status: AC
  Filled 2016-04-11: qty 5

## 2016-04-11 MED ORDER — IOPAMIDOL (ISOVUE-300) INJECTION 61%
INTRAVENOUS | Status: AC
  Filled 2016-04-11: qty 50

## 2016-04-11 MED ORDER — PHENYLEPHRINE 40 MCG/ML (10ML) SYRINGE FOR IV PUSH (FOR BLOOD PRESSURE SUPPORT)
PREFILLED_SYRINGE | INTRAVENOUS | Status: AC
  Filled 2016-04-11: qty 10

## 2016-04-11 MED ORDER — SUCCINYLCHOLINE CHLORIDE 200 MG/10ML IV SOSY
PREFILLED_SYRINGE | INTRAVENOUS | Status: DC | PRN
  Administered 2016-04-11: 80 mg via INTRAVENOUS

## 2016-04-11 MED ORDER — IOPAMIDOL (ISOVUE-300) INJECTION 61%
INTRAVENOUS | Status: DC | PRN
Start: 2016-04-11 — End: 2016-04-11
  Administered 2016-04-11 (×2): 50 mL

## 2016-04-11 MED ORDER — BACITRACIN ZINC 500 UNIT/GM EX OINT
TOPICAL_OINTMENT | CUTANEOUS | Status: DC | PRN
  Administered 2016-04-11: 1 via TOPICAL

## 2016-04-11 MED ORDER — ONDANSETRON HCL 4 MG/2ML IJ SOLN
INTRAMUSCULAR | Status: DC | PRN
Start: 2016-04-11 — End: 2016-04-11
  Administered 2016-04-11: 4 mg via INTRAVENOUS

## 2016-04-11 MED ORDER — BACITRACIN ZINC 500 UNIT/GM EX OINT
TOPICAL_OINTMENT | CUTANEOUS | Status: AC
  Filled 2016-04-11: qty 28.35

## 2016-04-11 MED ORDER — LACTATED RINGERS IV SOLN
INTRAVENOUS | Status: DC | PRN
  Administered 2016-04-11 (×2): via INTRAVENOUS

## 2016-04-11 MED ORDER — MUPIROCIN 2 % EX OINT
1.0000 "application " | TOPICAL_OINTMENT | Freq: Once | CUTANEOUS | Status: AC
  Administered 2016-04-11: 1 via TOPICAL

## 2016-04-11 MED ORDER — POVIDONE-IODINE 7.5 % EX SOLN: Freq: Once | CUTANEOUS | Status: DC

## 2016-04-11 MED ORDER — FENTANYL CITRATE (PF) 100 MCG/2ML IJ SOLN: 25.0000 ug | INTRAMUSCULAR | Status: DC | PRN

## 2016-04-11 MED ORDER — PHENYLEPHRINE HCL 10 MG/ML IJ SOLN
INTRAVENOUS | Status: DC | PRN
  Administered 2016-04-11: 20 ug/min via INTRAVENOUS

## 2016-04-11 MED ORDER — ONDANSETRON HCL 4 MG/2ML IJ SOLN
INTRAMUSCULAR | Status: AC
  Filled 2016-04-11: qty 2

## 2016-04-11 MED ORDER — MUPIROCIN 2 % EX OINT
TOPICAL_OINTMENT | CUTANEOUS | Status: AC
  Administered 2016-04-11: 1 via TOPICAL
  Filled 2016-04-11: qty 22

## 2016-04-11 MED ORDER — LIDOCAINE 2% (20 MG/ML) 5 ML SYRINGE
INTRAMUSCULAR | Status: DC | PRN
  Administered 2016-04-11: 10 mg via INTRAVENOUS
  Administered 2016-04-11: 50 mg via INTRAVENOUS

## 2016-04-11 MED ORDER — PROPOFOL 10 MG/ML IV BOLUS
INTRAVENOUS | Status: DC | PRN
  Administered 2016-04-11 (×2): 10 mg via INTRAVENOUS
  Administered 2016-04-11: 70 mg via INTRAVENOUS

## 2016-04-11 MED ORDER — ROCURONIUM BROMIDE 10 MG/ML (PF) SYRINGE
PREFILLED_SYRINGE | INTRAVENOUS | Status: AC
  Filled 2016-04-11: qty 10

## 2016-04-11 MED ORDER — BUPIVACAINE-EPINEPHRINE (PF) 0.25% -1:200000 IJ SOLN
INTRAMUSCULAR | Status: AC
  Filled 2016-04-11: qty 30

## 2016-04-11 MED ORDER — FENTANYL CITRATE (PF) 100 MCG/2ML IJ SOLN
INTRAMUSCULAR | Status: AC
  Filled 2016-04-11: qty 4

## 2016-04-11 MED ORDER — VANCOMYCIN HCL IN DEXTROSE 1-5 GM/200ML-% IV SOLN
1000.0000 mg | INTRAVENOUS | Status: AC
  Administered 2016-04-11 (×2): 1000 mg via INTRAVENOUS

## 2016-04-11 MED ORDER — VANCOMYCIN HCL IN DEXTROSE 1-5 GM/200ML-% IV SOLN
INTRAVENOUS | Status: AC
  Administered 2016-04-11: 1000 mg via INTRAVENOUS
  Filled 2016-04-11: qty 200

## 2016-04-11 MED ORDER — SUCCINYLCHOLINE CHLORIDE 200 MG/10ML IV SOSY
PREFILLED_SYRINGE | INTRAVENOUS | Status: AC
  Filled 2016-04-11: qty 10

## 2016-04-11 MED ORDER — PROPOFOL 10 MG/ML IV BOLUS
INTRAVENOUS | Status: AC
  Filled 2016-04-11: qty 20

## 2016-04-11 MED ORDER — FENTANYL CITRATE (PF) 100 MCG/2ML IJ SOLN
INTRAMUSCULAR | Status: DC | PRN
  Administered 2016-04-11 (×3): 25 ug via INTRAVENOUS

## 2016-04-11 MED ORDER — LACTATED RINGERS IV SOLN
INTRAVENOUS | Status: DC
Start: 2016-04-11 — End: 2016-04-11
  Administered 2016-04-11: 09:00:00 via INTRAVENOUS

## 2016-04-11 SURGICAL SUPPLY — 42 items
BLADE SURG 15 STRL LF DISP TIS (BLADE) ×1 IMPLANT
BLADE SURG 15 STRL SS (BLADE) ×3
CEMENT BONE KYPHX HV R (Orthopedic Implant) ×6 IMPLANT
CEMENT KYPHON C01A KIT/MIXER (Cement) ×6 IMPLANT
COVER MAYO STAND STRL (DRAPES) ×3 IMPLANT
COVER SURGICAL LIGHT HANDLE (MISCELLANEOUS) ×3 IMPLANT
CURETTE EXPRESS SZ2 7MM (INSTRUMENTS) ×1 IMPLANT
CURRETTE EXPRESS SZ2 7MM (INSTRUMENTS) ×3
DRAPE C-ARM 42X72 X-RAY (DRAPES) ×3 IMPLANT
DRAPE INCISE IOBAN 66X45 STRL (DRAPES) ×3 IMPLANT
DRAPE LAPAROTOMY T 102X78X121 (DRAPES) ×3 IMPLANT
DRAPE PROXIMA HALF (DRAPES) IMPLANT
DRAPE SURG 17X23 STRL (DRAPES) ×12 IMPLANT
DURAPREP 26ML APPLICATOR (WOUND CARE) ×3 IMPLANT
GAUZE SPONGE 4X4 12PLY STRL (GAUZE/BANDAGES/DRESSINGS) ×3 IMPLANT
GAUZE SPONGE 4X4 16PLY XRAY LF (GAUZE/BANDAGES/DRESSINGS) ×6 IMPLANT
GLOVE BIO SURGEON STRL SZ7 (GLOVE) ×3 IMPLANT
GLOVE BIO SURGEON STRL SZ8 (GLOVE) ×3 IMPLANT
GLOVE BIOGEL PI IND STRL 7.0 (GLOVE) ×1 IMPLANT
GLOVE BIOGEL PI IND STRL 8 (GLOVE) ×1 IMPLANT
GLOVE BIOGEL PI INDICATOR 7.0 (GLOVE) ×2
GLOVE BIOGEL PI INDICATOR 8 (GLOVE) ×2
GOWN STRL REUS W/ TWL LRG LVL3 (GOWN DISPOSABLE) ×2 IMPLANT
GOWN STRL REUS W/ TWL XL LVL3 (GOWN DISPOSABLE) ×1 IMPLANT
GOWN STRL REUS W/TWL LRG LVL3 (GOWN DISPOSABLE) ×6
GOWN STRL REUS W/TWL XL LVL3 (GOWN DISPOSABLE) ×3
KIT BASIN OR (CUSTOM PROCEDURE TRAY) ×3 IMPLANT
KIT ROOM TURNOVER OR (KITS) ×3 IMPLANT
NEEDLE 22X1 1/2 (OR ONLY) (NEEDLE) IMPLANT
NEEDLE HYPO 25X1 1.5 SAFETY (NEEDLE) IMPLANT
NEEDLE SPNL 18GX3.5 QUINCKE PK (NEEDLE) ×6 IMPLANT
NS IRRIG 1000ML POUR BTL (IV SOLUTION) ×3 IMPLANT
PACK SURGICAL SETUP 50X90 (CUSTOM PROCEDURE TRAY) ×3 IMPLANT
PAD ARMBOARD 7.5X6 YLW CONV (MISCELLANEOUS) ×6 IMPLANT
POSITIONER HEAD PRONE TRACH (MISCELLANEOUS) ×3 IMPLANT
SUT MNCRL AB 4-0 PS2 18 (SUTURE) ×3 IMPLANT
SYR BULB IRRIGATION 50ML (SYRINGE) ×3 IMPLANT
SYR CONTROL 10ML LL (SYRINGE) ×3 IMPLANT
TAPE CLOTH SURG 6X10 WHT LF (GAUZE/BANDAGES/DRESSINGS) ×3 IMPLANT
TOWEL OR 17X24 6PK STRL BLUE (TOWEL DISPOSABLE) ×3 IMPLANT
TOWEL OR 17X26 10 PK STRL BLUE (TOWEL DISPOSABLE) ×3 IMPLANT
TRAY KYPHOPAK 15/3 ONESTEP 1ST (MISCELLANEOUS) ×6 IMPLANT

## 2016-04-11 NOTE — Anesthesia Preprocedure Evaluation (Addendum)
Anesthesia Evaluation  Patient identified by MRN, date of birth, ID band Patient awake and Patient confused    Reviewed: Allergy & Precautions  History of Anesthesia Complications (+) Family history of anesthesia reaction  Airway Mallampati: I  TM Distance: >3 FB Neck ROM: Full    Dental  (+) Partial Upper, Dental Advisory Given   Pulmonary    breath sounds clear to auscultation       Cardiovascular hypertension,  Rhythm:Regular Rate:Normal + Systolic murmurs    Neuro/Psych Daughter describes some dementia    GI/Hepatic   Endo/Other    Renal/GU      Musculoskeletal  (+) Arthritis ,   Abdominal   Peds  Hematology   Anesthesia Other Findings   Reproductive/Obstetrics                            Anesthesia Physical Anesthesia Plan  ASA: III  Anesthesia Plan: General   Post-op Pain Management:    Induction: Intravenous  Airway Management Planned: Oral ETT  Additional Equipment:   Intra-op Plan:   Post-operative Plan: Extubation in OR  Informed Consent: I have reviewed the patients History and Physical, chart, labs and discussed the procedure including the risks, benefits and alternatives for the proposed anesthesia with the patient or authorized representative who has indicated his/her understanding and acceptance.     Plan Discussed with:   Anesthesia Plan Comments: (Discussed plan c daughter regarding face down position and intubation annd Gen anesth)        Anesthesia Quick Evaluation

## 2016-04-11 NOTE — Anesthesia Procedure Notes (Signed)
Procedure Name: Intubation Date/Time: 04/11/2016 10:52 AM Performed by: Garrison Columbus T Pre-anesthesia Checklist: Patient identified, Emergency Drugs available, Suction available and Patient being monitored Patient Re-evaluated:Patient Re-evaluated prior to inductionOxygen Delivery Method: Circle System Utilized Preoxygenation: Pre-oxygenation with 100% oxygen Intubation Type: IV induction Ventilation: Mask ventilation without difficulty Laryngoscope Size: Mac and 3 Grade View: Grade I Tube type: Oral Tube size: 7.0 mm Number of attempts: 1 Airway Equipment and Method: Stylet and Oral airway Placement Confirmation: ETT inserted through vocal cords under direct vision,  positive ETCO2 and breath sounds checked- equal and bilateral Secured at: 21 cm Tube secured with: Tape Dental Injury: Teeth and Oropharynx as per pre-operative assessment  Comments: Intubation by Delphia Grates, CRNA

## 2016-04-11 NOTE — Anesthesia Postprocedure Evaluation (Signed)
Anesthesia Post Note  Patient: Vanessa Pace  Procedure(s) Performed: Procedure(s) (LRB): LUMBAR 1, LUMBAR 3, LUMBAR 4 KYPHOPLASTY (N/A)  Patient location during evaluation: PACU Anesthesia Type: General Level of consciousness: awake and alert Pain management: pain level controlled Vital Signs Assessment: post-procedure vital signs reviewed and stable Respiratory status: spontaneous breathing, nonlabored ventilation, respiratory function stable and patient connected to nasal cannula oxygen Cardiovascular status: blood pressure returned to baseline and stable Postop Assessment: no signs of nausea or vomiting Anesthetic complications: no    Last Vitals:  Vitals:   04/11/16 1300 04/11/16 1315  BP: (!) 166/88 (!) 146/98  Pulse: 84 84  Resp: 15 14  Temp: 36.3 C     Last Pain:  Vitals:   04/11/16 0908  TempSrc: Oral  PainSc:                  Lestat Golob,JAMES TERRILL

## 2016-04-11 NOTE — Transfer of Care (Signed)
Immediate Anesthesia Transfer of Care Note  Patient: Vanessa Pace  Procedure(s) Performed: Procedure(s) with comments: LUMBAR 1, LUMBAR 3, LUMBAR 4 KYPHOPLASTY (N/A) - LUMBAR1, LUMBAR 3, LUMBAR 4 KYPHOPLASTY  Patient Location: PACU  Anesthesia Type:General  Level of Consciousness: awake and alert   Airway & Oxygen Therapy: Patient Spontanous Breathing  Post-op Assessment: Report given to RN, Post -op Vital signs reviewed and stable and Patient moving all extremities X 4  Post vital signs: Reviewed and stable  Last Vitals:  Vitals:   04/11/16 0908  BP: (!) 180/90  Pulse: 94  Resp: 20  Temp: 37.2 C    Last Pain:  Vitals:   04/11/16 0908  TempSrc: Oral  PainSc:       Patients Stated Pain Goal: 3 ([id number])  Complications: No apparent anesthesia complications

## 2016-04-12 ENCOUNTER — Encounter (HOSPITAL_COMMUNITY): Payer: Self-pay | Admitting: Orthopedic Surgery

## 2016-04-12 NOTE — Op Note (Signed)
NAMEJONNI, IBACH NO.:  [id number]  MEDICAL RECORD NO.:  [id number]  LOCATION:                                 FACILITY:  PHYSICIAN:  Phylliss Bob, MD      DATE OF BIRTH:  May 05, 1927  DATE OF PROCEDURE:  04/11/2016                              OPERATIVE REPORT   PREOPERATIVE DIAGNOSIS:  Acute/subacute lumbar compression fractures, L1, L3, L4.  POSTOPERATIVE DIAGNOSIS:  Acute/subacute lumbar compression fractures, L1, L3, L4.  PROCEDURE:  Kyphoplasty x3 (L1, L3, L4).  ASSISTANT:  Pricilla Holm, PA-C.  ANESTHESIA:  General endotracheal anesthesia.  COMPLICATIONS:  None.  DISPOSITION:  Stable.  ESTIMATED BLOOD LOSS:  Minimal.  INDICATIONS FOR SURGERY:  Briefly, Ms. Fortini is a very pleasant 82- year-old female who did present to me with severe debilitating pain in her low back.  X-rays and an MRI were notable for an acute/subacute compression fractures at the levels outlined above.  We did initially proceed with nonoperative measures including pain medication as well as brace treatment.  The patient did continue to have ongoing pain.  She did elect to proceed with the procedure reflected above.  Of note, the patient was noted to have chronic deformities involving the thoracic spine, which did not have any edema on the MRI, specifically in the lower thoracic region.  Also, her pain was overlying the lumbar region, so we did discuss the pros and cons of proceeding with the procedure reflected above.  The patient was fully aware of the risks and limitations of surgery and did elect to proceed.  OPERATIVE DETAILS:  On April 11, 2016, the patient was brought to surgery and general endotracheal anesthesia was administered.  The patient was placed prone on a well-padded flat Jackson bed with a spinal frame.  Antibiotics were given and a time-out procedure was performed. The back was prepped and draped.  I then made small stab incisions at the  superior and lateral aspects of the L1, L3, and L4 pedicles.  Using Jamshidi, I did advance the Jamshidi across the pedicles bilaterally at L1, L3, and L4.  I did liberally use AP and lateral fluoroscopy.  Once the trocars were in their appropriate position, I did drill and curette into the vertebral bodies of L1, L3, and L4.  Kyphoplasty balloons were inserted bilaterally at each level and inflated.  Each level did accommodate approximately 6-7 mL of contrast.  Once the cement was partially hardened, cement was introduced into the L1, L3, and L4 vertebral bodies uneventfully.  Excellent interdigitation of cement was noted in the L3 and L4 vertebral bodies.  At the L1 vertebral body, a total of above 7 mL of cement was introduced.  Of note, there was very low pressure noted particularly in the inferior aspect of the L1 vertebral body and when advancing the drill and curette, consistent with the patient's preoperative MRI.  I did fill each level with what I did feel to be the most appropriate and the safest amount of cement.  I was very pleased with the final AP and lateral fluoroscopic images.  The wound was then irrigated.  The trocars were removed.  Each stab  incision was closed with 4-0 Monocryl.  I was very pleased with the images, as mentioned.  The patient was then awakened from general endotracheal anesthesia and transferred to recovery in stable condition.     Phylliss Bob, MD   ______________________________ Phylliss Bob, MD    MD/MEDQ  D:  04/11/2016  T:  04/12/2016  Job:  [id number]

## 2016-04-24 ENCOUNTER — Other Ambulatory Visit: Payer: Self-pay | Admitting: Family Medicine

## 2016-04-24 DIAGNOSIS — R918 Other nonspecific abnormal finding of lung field: Secondary | ICD-10-CM

## 2016-04-24 DIAGNOSIS — Z9889 Other specified postprocedural states: Secondary | ICD-10-CM | POA: Diagnosis not present

## 2016-04-25 ENCOUNTER — Ambulatory Visit
Admission: RE | Admit: 2016-04-25 | Discharge: 2016-04-25 | Disposition: A | Discharge status: Home / Self Care | Payer: Medicare Other | Source: Ambulatory Visit | Attending: Family Medicine | Admitting: Family Medicine

## 2016-04-25 DIAGNOSIS — R918 Other nonspecific abnormal finding of lung field: Secondary | ICD-10-CM

## 2016-04-25 MED ORDER — IOPAMIDOL (ISOVUE-300) INJECTION 61%
75.0000 mL | Freq: Once | INTRAVENOUS | Status: AC | PRN
  Administered 2016-04-25: 75 mL via INTRAVENOUS

## 2016-06-13 ENCOUNTER — Inpatient Hospital Stay (HOSPITAL_COMMUNITY)
Admission: EM | Admit: 2016-06-13 | Discharge: 2016-06-19 | DRG: 562 | Disposition: A | Discharge status: Skilled Nursing Facility | Payer: Medicare Other | Attending: General Surgery | Admitting: General Surgery

## 2016-06-13 ENCOUNTER — Emergency Department (HOSPITAL_COMMUNITY): Payer: Medicare Other

## 2016-06-13 ENCOUNTER — Encounter (HOSPITAL_COMMUNITY): Payer: Self-pay

## 2016-06-13 DIAGNOSIS — S79911A Unspecified injury of right hip, initial encounter: Secondary | ICD-10-CM | POA: Diagnosis not present

## 2016-06-13 DIAGNOSIS — S32502A Unspecified fracture of left pubis, initial encounter for closed fracture: Secondary | ICD-10-CM | POA: Diagnosis not present

## 2016-06-13 DIAGNOSIS — E43 Unspecified severe protein-calorie malnutrition: Secondary | ICD-10-CM | POA: Diagnosis present

## 2016-06-13 DIAGNOSIS — S89202A Unspecified physeal fracture of upper end of left fibula, initial encounter for closed fracture: Secondary | ICD-10-CM | POA: Diagnosis present

## 2016-06-13 DIAGNOSIS — W19XXXA Unspecified fall, initial encounter: Secondary | ICD-10-CM

## 2016-06-13 DIAGNOSIS — S82392A Other fracture of lower end of left tibia, initial encounter for closed fracture: Secondary | ICD-10-CM | POA: Diagnosis not present

## 2016-06-13 DIAGNOSIS — S59902A Unspecified injury of left elbow, initial encounter: Secondary | ICD-10-CM | POA: Diagnosis not present

## 2016-06-13 DIAGNOSIS — R262 Difficulty in walking, not elsewhere classified: Secondary | ICD-10-CM

## 2016-06-13 DIAGNOSIS — S3289XD Fracture of other parts of pelvis, subsequent encounter for fracture with routine healing: Secondary | ICD-10-CM | POA: Diagnosis not present

## 2016-06-13 DIAGNOSIS — S52572A Other intraarticular fracture of lower end of left radius, initial encounter for closed fracture: Secondary | ICD-10-CM | POA: Diagnosis present

## 2016-06-13 DIAGNOSIS — H3530 Unspecified macular degeneration: Secondary | ICD-10-CM | POA: Diagnosis present

## 2016-06-13 DIAGNOSIS — W108XXA Fall (on) (from) other stairs and steps, initial encounter: Secondary | ICD-10-CM | POA: Diagnosis present

## 2016-06-13 DIAGNOSIS — Z8049 Family history of malignant neoplasm of other genital organs: Secondary | ICD-10-CM

## 2016-06-13 DIAGNOSIS — R52 Pain, unspecified: Secondary | ICD-10-CM

## 2016-06-13 DIAGNOSIS — S82252D Displaced comminuted fracture of shaft of left tibia, subsequent encounter for closed fracture with routine healing: Secondary | ICD-10-CM | POA: Diagnosis not present

## 2016-06-13 DIAGNOSIS — R2689 Other abnormalities of gait and mobility: Secondary | ICD-10-CM | POA: Diagnosis not present

## 2016-06-13 DIAGNOSIS — Z8249 Family history of ischemic heart disease and other diseases of the circulatory system: Secondary | ICD-10-CM

## 2016-06-13 DIAGNOSIS — D649 Anemia, unspecified: Secondary | ICD-10-CM | POA: Diagnosis present

## 2016-06-13 DIAGNOSIS — M6281 Muscle weakness (generalized): Secondary | ICD-10-CM | POA: Diagnosis not present

## 2016-06-13 DIAGNOSIS — Y92007 Garden or yard of unspecified non-institutional (private) residence as the place of occurrence of the external cause: Secondary | ICD-10-CM

## 2016-06-13 DIAGNOSIS — S82192A Other fracture of upper end of left tibia, initial encounter for closed fracture: Secondary | ICD-10-CM | POA: Diagnosis not present

## 2016-06-13 DIAGNOSIS — Z681 Body mass index (BMI) 19 or less, adult: Secondary | ICD-10-CM | POA: Diagnosis not present

## 2016-06-13 DIAGNOSIS — R2681 Unsteadiness on feet: Secondary | ICD-10-CM | POA: Diagnosis not present

## 2016-06-13 DIAGNOSIS — S8990XA Unspecified injury of unspecified lower leg, initial encounter: Secondary | ICD-10-CM | POA: Diagnosis not present

## 2016-06-13 DIAGNOSIS — M25522 Pain in left elbow: Secondary | ICD-10-CM | POA: Diagnosis not present

## 2016-06-13 DIAGNOSIS — S52502D Unspecified fracture of the lower end of left radius, subsequent encounter for closed fracture with routine healing: Secondary | ICD-10-CM | POA: Diagnosis not present

## 2016-06-13 DIAGNOSIS — R339 Retention of urine, unspecified: Secondary | ICD-10-CM | POA: Diagnosis present

## 2016-06-13 DIAGNOSIS — S82832A Other fracture of upper and lower end of left fibula, initial encounter for closed fracture: Secondary | ICD-10-CM | POA: Diagnosis present

## 2016-06-13 DIAGNOSIS — S32592A Other specified fracture of left pubis, initial encounter for closed fracture: Secondary | ICD-10-CM | POA: Diagnosis present

## 2016-06-13 DIAGNOSIS — S32512A Fracture of superior rim of left pubis, initial encounter for closed fracture: Secondary | ICD-10-CM | POA: Diagnosis not present

## 2016-06-13 DIAGNOSIS — S82142A Displaced bicondylar fracture of left tibia, initial encounter for closed fracture: Secondary | ICD-10-CM | POA: Diagnosis present

## 2016-06-13 DIAGNOSIS — S0990XA Unspecified injury of head, initial encounter: Secondary | ICD-10-CM | POA: Diagnosis not present

## 2016-06-13 DIAGNOSIS — Z9181 History of falling: Secondary | ICD-10-CM | POA: Diagnosis not present

## 2016-06-13 DIAGNOSIS — T148XXA Other injury of unspecified body region, initial encounter: Secondary | ICD-10-CM | POA: Diagnosis not present

## 2016-06-13 DIAGNOSIS — S199XXA Unspecified injury of neck, initial encounter: Secondary | ICD-10-CM | POA: Diagnosis not present

## 2016-06-13 DIAGNOSIS — S62102A Fracture of unspecified carpal bone, left wrist, initial encounter for closed fracture: Secondary | ICD-10-CM | POA: Diagnosis not present

## 2016-06-13 DIAGNOSIS — R4189 Other symptoms and signs involving cognitive functions and awareness: Secondary | ICD-10-CM | POA: Diagnosis not present

## 2016-06-13 DIAGNOSIS — S82402A Unspecified fracture of shaft of left fibula, initial encounter for closed fracture: Secondary | ICD-10-CM | POA: Diagnosis not present

## 2016-06-13 DIAGNOSIS — Z86718 Personal history of other venous thrombosis and embolism: Secondary | ICD-10-CM | POA: Diagnosis not present

## 2016-06-13 DIAGNOSIS — S52615A Nondisplaced fracture of left ulna styloid process, initial encounter for closed fracture: Secondary | ICD-10-CM | POA: Diagnosis present

## 2016-06-13 DIAGNOSIS — S82452D Displaced comminuted fracture of shaft of left fibula, subsequent encounter for closed fracture with routine healing: Secondary | ICD-10-CM | POA: Diagnosis not present

## 2016-06-13 DIAGNOSIS — G8911 Acute pain due to trauma: Secondary | ICD-10-CM | POA: Diagnosis not present

## 2016-06-13 DIAGNOSIS — S329XXA Fracture of unspecified parts of lumbosacral spine and pelvis, initial encounter for closed fracture: Secondary | ICD-10-CM | POA: Diagnosis present

## 2016-06-13 DIAGNOSIS — S59919D Unspecified injury of unspecified forearm, subsequent encounter: Secondary | ICD-10-CM | POA: Diagnosis not present

## 2016-06-13 DIAGNOSIS — M25539 Pain in unspecified wrist: Secondary | ICD-10-CM | POA: Diagnosis not present

## 2016-06-13 DIAGNOSIS — S9032XA Contusion of left foot, initial encounter: Secondary | ICD-10-CM | POA: Diagnosis not present

## 2016-06-13 DIAGNOSIS — S82202A Unspecified fracture of shaft of left tibia, initial encounter for closed fracture: Secondary | ICD-10-CM | POA: Diagnosis not present

## 2016-06-13 LAB — CBC WITH DIFFERENTIAL/PLATELET
BASOS ABS: 0 10*3/uL (ref 0.0–0.1)
Basophils Relative: 0 %
Eosinophils Absolute: 0 10*3/uL (ref 0.0–0.7)
Eosinophils Relative: 0 %
HEMATOCRIT: 30.4 % — AB (ref 36.0–46.0)
HEMOGLOBIN: 10.2 g/dL — AB (ref 12.0–15.0)
LYMPHS PCT: 9 %
Lymphs Abs: 1.6 10*3/uL (ref 0.7–4.0)
MCH: 30.2 pg (ref 26.0–34.0)
MCHC: 33.6 g/dL (ref 30.0–36.0)
MCV: 89.9 fL (ref 78.0–100.0)
MONOS PCT: 3 %
Monocytes Absolute: 0.5 10*3/uL (ref 0.1–1.0)
Neutro Abs: 15.8 10*3/uL — ABNORMAL HIGH (ref 1.7–7.7)
Neutrophils Relative %: 88 %
Platelets: 172 10*3/uL (ref 150–400)
RBC: 3.38 MIL/uL — ABNORMAL LOW (ref 3.87–5.11)
RDW: 13.4 % (ref 11.5–15.5)
WBC: 17.9 10*3/uL — AB (ref 4.0–10.5)

## 2016-06-13 LAB — COMPREHENSIVE METABOLIC PANEL
ALBUMIN: 3.8 g/dL (ref 3.5–5.0)
ALK PHOS: 75 U/L (ref 38–126)
ALT: 25 U/L (ref 14–54)
AST: 37 U/L (ref 15–41)
Anion gap: 10 (ref 5–15)
BILIRUBIN TOTAL: 0.6 mg/dL (ref 0.3–1.2)
BUN: 14 mg/dL (ref 6–20)
CALCIUM: 8.7 mg/dL — AB (ref 8.9–10.3)
CO2: 24 mmol/L (ref 22–32)
Chloride: 102 mmol/L (ref 101–111)
Creatinine, Ser: 0.77 mg/dL (ref 0.44–1.00)
GFR calc Af Amer: >60 mL/min (ref >60)
GFR calc non Af Amer: >60 mL/min (ref >60)
GLUCOSE: 126 mg/dL — AB (ref 65–99)
Potassium: 4 mmol/L (ref 3.5–5.1)
Sodium: 136 mmol/L (ref 135–145)
TOTAL PROTEIN: 6.7 g/dL (ref 6.5–8.1)

## 2016-06-13 LAB — CK: Total CK: 470 U/L — ABNORMAL HIGH (ref 38–234)

## 2016-06-13 MED ORDER — ONDANSETRON HCL 4 MG/2ML IJ SOLN: 4.0000 mg | Freq: Four times a day (QID) | INTRAMUSCULAR | Status: DC | PRN

## 2016-06-13 MED ORDER — ENOXAPARIN SODIUM 40 MG/0.4ML ~~LOC~~ SOLN
40.0000 mg | SUBCUTANEOUS | Status: DC
  Administered 2016-06-14 – 2016-06-15 (×2): 40 mg via SUBCUTANEOUS

## 2016-06-13 MED ORDER — MORPHINE SULFATE (PF) 2 MG/ML IV SOLN
2.0000 mg | INTRAVENOUS | Status: DC | PRN
  Administered 2016-06-13 – 2016-06-15 (×4): 2 mg via INTRAVENOUS
  Filled 2016-06-13 (×4): qty 1

## 2016-06-13 MED ORDER — DOXYLAMINE SUCCINATE (SLEEP) 25 MG PO TABS
25.0000 mg | ORAL_TABLET | Freq: Every evening | ORAL | Status: DC | PRN
  Filled 2016-06-13: qty 1

## 2016-06-13 MED ORDER — SODIUM CHLORIDE 0.9 % IV BOLUS (SEPSIS)
500.0000 mL | Freq: Once | INTRAVENOUS | Status: AC
  Administered 2016-06-13: 500 mL via INTRAVENOUS

## 2016-06-13 MED ORDER — DIAZEPAM 5 MG PO TABS
2.5000 mg | ORAL_TABLET | Freq: Once | ORAL | Status: AC
  Administered 2016-06-13: 2.5 mg via ORAL
  Filled 2016-06-13: qty 1

## 2016-06-13 MED ORDER — ONDANSETRON HCL 4 MG PO TABS: 4.0000 mg | ORAL_TABLET | Freq: Four times a day (QID) | ORAL | Status: DC | PRN

## 2016-06-13 MED ORDER — POTASSIUM CHLORIDE IN NACL 20-0.9 MEQ/L-% IV SOLN
INTRAVENOUS | Status: DC
  Administered 2016-06-13 – 2016-06-17 (×4): via INTRAVENOUS
  Filled 2016-06-13 (×6): qty 1000

## 2016-06-13 MED ORDER — METHOCARBAMOL 500 MG PO TABS
500.0000 mg | ORAL_TABLET | Freq: Three times a day (TID) | ORAL | Status: DC | PRN
  Administered 2016-06-14 – 2016-06-15 (×2): 500 mg via ORAL
  Filled 2016-06-13 (×2): qty 1

## 2016-06-13 MED ORDER — ONDANSETRON HCL 4 MG/2ML IJ SOLN
4.0000 mg | Freq: Once | INTRAMUSCULAR | Status: AC
  Administered 2016-06-13: 4 mg via INTRAVENOUS
  Filled 2016-06-13: qty 2

## 2016-06-13 MED ORDER — MORPHINE SULFATE (PF) 4 MG/ML IV SOLN
4.0000 mg | Freq: Once | INTRAVENOUS | Status: AC
  Administered 2016-06-13: 4 mg via INTRAVENOUS
  Filled 2016-06-13: qty 1

## 2016-06-13 NOTE — ED Provider Notes (Signed)
[facility] DEPT Provider Note   CSN: [id number] Arrival date & time: 06/13/16  1702     History   Chief Complaint Chief Complaint  Patient presents with  . Fall    HPI Vanessa Pace is a 83 y.o. female.  Patient is an 83 year old female with a history of recent compression fractures status post kyphoplasty, arthritis, falls presenting today after a fall at 9 AM this morning. She went outside to feed her cats in her house shoes and slipped falling down 3 cement steps. She had significant pain in her left knee and left wrist and was unable to get up. She lives with her daughter but her daughter was sleeping and did not hear her. She may outside from 9 AM until 4 PM. She denies hitting her head or loss of consciousness. She denies any shortness of breath, chest pain or abdominal pain. She is not complaining of a week back pain. She has significant pain of the left knee but is able to wiggle her toes bilaterally. She is complaining of feeling cold.  She does not take anticoagulation.   The history is provided by the patient.    Past Medical History:  Diagnosis Date  . Arthritis   . Cancer ([place])    skin/face  . Compression fracture of L1 lumbar vertebra (H[facility])    L3 and L4  . Dementia   . DVT (deep venous thrombosis) ([place])   . Elbow fracture 12/04/2013   rt elbow  . Family history of anesthesia complication    Daughter is difficult to intubate   . GERD (gastroesophageal reflux disease)   . Macular degeneration   . PONV (postoperative nausea and vomiting)     Patient Active Problem List   Diagnosis Date Noted  . Fall 12/09/2013  . Fracture of olecranon process of right ulna 12/08/2013  . Hip fracture ([place]) 12/04/2013  . Closed fracture of right ulna 12/04/2013  . Minor head injury without loss of consciousness 12/04/2013  . Leucocytosis 12/04/2013    Past Surgical History:  Procedure Laterality Date  . cataracts     B/L  . COLONOSCOPY W/ BIOPSIES AND POLYPECTOMY      . femur surgery    . FRACTURE SURGERY     right hip  . HIP SURGERY    . KYPHOPLASTY N/A 04/11/2016   Procedure: LUMBAR 1, LUMBAR 3, LUMBAR 4 KYPHOPLASTY;  Surgeon: Phylliss Bob, MD;  Location: [facility];  Service: Orthopedics;  Laterality: N/A;  LUMBAR1, LUMBAR 3, LUMBAR 4 KYPHOPLASTY  . ORIF ELBOW FRACTURE Right 12/06/2013   Procedure: OPEN REDUCTION INTERNAL FIXATION (ORIF) ELBOW/OLECRANON FRACTURE;  Surgeon: Jolyn Nap, MD;  Location: [facility];  Service: Orthopedics;  Laterality: Right;    OB History    No data available       Home Medications    Prior to Admission medications   Medication Sig Start Date End Date Taking? Authorizing Provider  diclofenac sodium (VOLTAREN) 1 % GEL Apply 2 g topically 3 (three) times daily. Patient taking differently: Apply 2 g topically 3 (three) times daily as needed (pain).  12/21/13   Lavon Paganini Angiulli, PA-C  DiphenhydrAMINE HCl, Sleep, (UNISOM SLEEPGELS) 50 MG CAPS Take 50 mg by mouth at bedtime.    Historical Provider, MD  Multiple Vitamins-Minerals (ICAPS AREDS 2 PO) Take 1 capsule by mouth 3 (three) times a week.    Historical Provider, MD  polyethylene glycol (MIRALAX / GLYCOLAX) packet Take 17 g by mouth daily. Patient taking  differently: Take 17 g by mouth daily as needed for mild constipation.  12/09/13   Reyne Dumas, MD    Family History Family History  Problem Relation Age of Onset  . CAD Father   . CAD Son   . Cervical cancer Daughter     Social History Social History  Substance Use Topics  . Smoking status: Never Smoker  . Smokeless tobacco: Never Used     Comment: exposed to 2nd hand smoke   . Alcohol use Yes     Comment: occasionally     Allergies   Adhesive [tape]; Amoxicillin; and Hydrocodone   Review of Systems Review of Systems  All other systems reviewed and are negative.    Physical Exam Updated Vital Signs BP (!) 155/123   Pulse 108   Temp (!) 94 F (34.4 C) (Rectal)   Resp (!) 28   Ht 5' (1.524  m)   Wt 85 lb (38.6 kg)   SpO2 100%   BMI 16.60 kg/m   Physical Exam  Constitutional: She is oriented to person, place, and time. She appears well-developed. She appears cachectic. No distress.  HENT:  Head: Normocephalic and atraumatic.    Mouth/Throat: Oropharynx is clear and moist.  Eyes: Conjunctivae and EOM are normal. Pupils are equal, round, and reactive to light.  Neck: Neck supple.  In c-collar.  No tenderness on palpation  Cardiovascular: Regular rhythm and intact distal pulses.  Tachycardia present.   Murmur heard.  Systolic murmur is present with a grade of 2/6  Pulmonary/Chest: Effort normal and breath sounds normal. No respiratory distress. She has no wheezes. She has no rales.  Abdominal: Soft. She exhibits no distension. There is no tenderness. There is no rebound and no guarding.  Musculoskeletal: She exhibits tenderness and deformity. She exhibits no edema.       Left wrist: She exhibits decreased range of motion, tenderness and deformity.       Left knee: She exhibits decreased range of motion, swelling, effusion and deformity. Tenderness found. Medial joint line and lateral joint line tenderness noted.  Left wrist with deformity.  1+ radial pulse and 3sec cap refill.  Sensation and movement intact in hand.  Mild pain over the elbow but no obvious injury.  Left knee with deformity and pain with movement.  Able to move the left toes and pulse palpable.  No pain at the ankle.  Mild pain with palpation of the left hip without deformity.  Neurological: She is alert and oriented to person, place, and time.  Skin: Skin is dry. No rash noted. No erythema.  Cold to the touch  Psychiatric: She has a normal mood and affect. Her behavior is normal.  Nursing note and vitals reviewed.    ED Treatments / Results  Labs (all labs ordered are listed, but only abnormal results are displayed) Labs Reviewed  CBC WITH DIFFERENTIAL/PLATELET - Abnormal; Notable for the following:         Result Value   WBC 17.9 (*)    RBC 3.38 (*)    Hemoglobin 10.2 (*)    HCT 30.4 (*)    Neutro Abs 15.8 (*)    All other components within normal limits  COMPREHENSIVE METABOLIC PANEL - Abnormal; Notable for the following:    Glucose, Bld 126 (*)    Calcium 8.7 (*)    All other components within normal limits  CK - Abnormal; Notable for the following:    Total CK 470 (*)  All other components within normal limits    EKG  EKG Interpretation  Date/Time:  Thursday June 13 2016 17:39:43 EST Ventricular Rate:  106 PR Interval:    QRS Duration: 80 QT Interval:  349 QTC Calculation: 464 R Axis:   7 Text Interpretation:  Sinus tachycardia Biatrial enlargement No significant change since last tracing Confirmed by Maryan Rued  MD, Loree Fee ([id number]) on 06/13/2016 6:32:31 PM       Radiology Dg Elbow Complete Left  Result Date: 06/13/2016 CLINICAL DATA:  Status post fall, with left elbow pain. Initial encounter. EXAM: LEFT ELBOW - COMPLETE 3+ VIEW COMPARISON:  None. FINDINGS: There is no evidence of fracture or dislocation. The visualized joint spaces are preserved. No significant joint effusion is identified. Mild soft tissue swelling is noted overlying the olecranon. IMPRESSION: No evidence of fracture or dislocation. Electronically Signed   By: Garald Balding M.D.   On: 06/13/2016 19:28   Dg Wrist Complete Left  Result Date: 06/13/2016 CLINICAL DATA:  [place] at home around 09:00. EXAM: LEFT WRIST - COMPLETE 3+ VIEW COMPARISON:  None. FINDINGS: There is an intra-articular comminuted fracture of the distal radius with moderate impaction. There is slight irregularity of the ulnar styloid, and nondisplaced ulnar styloid fracture cannot be excluded. There is scapholunate dissociation, indeterminate chronicity. There is moderate arthritic change of the first carpometacarpal articulation. IMPRESSION: 1. Acute intra-articular fracture of the distal radius with moderate comminution and impaction.  2. Possible nondisplaced ulnar styloid fracture. 3. Scapholunate dissociation of indeterminate chronicity. Electronically Signed   By: Andreas Newport M.D.   On: 06/13/2016 19:28   Dg Tibia/fibula Left  Result Date: 06/13/2016 CLINICAL DATA:  Status post fall, with left knee deformity. Initial encounter. EXAM: LEFT TIBIA AND FIBULA - 2 VIEW COMPARISON:  None. FINDINGS: The comminuted fractures of the proximal tibia and fibula are better characterized on concurrent knee radiographs. No additional fractures are seen. The ankle mortise is incompletely assessed, but appears grossly unremarkable. Mild diffuse soft tissue swelling is noted about the left lower leg. A large lipohemarthrosis is seen at the left knee joint. There is osteopenia of visualized osseous structures. Diffuse vascular calcifications are seen. IMPRESSION: 1. Comminuted fractures of the proximal tibia and fibula, better characterized on concurrent knee radiographs. 2. Large lipohemarthrosis at the left knee joint. 3. Diffuse vascular calcifications seen. 4. Osteopenia of visualized osseous structures. Electronically Signed   By: Garald Balding M.D.   On: 06/13/2016 19:30   Ct Head Wo Contrast  Result Date: 06/13/2016 CLINICAL DATA:  Initial evaluation for acute trauma, fall. EXAM: CT HEAD WITHOUT CONTRAST CT CERVICAL SPINE WITHOUT CONTRAST TECHNIQUE: Multidetector CT imaging of the head and cervical spine was performed following the standard protocol without intravenous contrast. Multiplanar CT image reconstructions of the cervical spine were also generated. COMPARISON:  Prior CT from 12/04/2013. FINDINGS: CT HEAD FINDINGS Brain: Generalized cerebral atrophy with moderate chronic microvascular ischemic disease. No acute intracranial hemorrhage. No evidence for acute large vessel territory infarct. No mass lesion, midline shift, or mass effect. Ventricular prominence related global parenchymal volume loss of hydrocephalus. No extra-axial fluid  collection. Vascular: No hyperdense vessel. Prominent vascular calcifications present within the carotid siphons and distal vertebral arteries. Skull: Scalp soft tissues demonstrate no acute abnormality. Calvarium intact. Sinuses/Orbits: Globes and orbital soft tissues within normal limits. Patient is status post lens extraction bilaterally. Mild mucosal thickening within the right sphenoid sinus. Paranasal sinuses are otherwise clear. No mastoid effusion. CT CERVICAL SPINE FINDINGS Alignment: Grade 1 anterolisthesis of  C3 on C4 and C4 on C5, likely related to chronic facet degeneration. Vertebral bodies otherwise normally aligned. Skull base and vertebrae: Skullbase intact. Skullbase intact. Normal C1-2 articulations preserved. Dens is intact. Height loss at the inferior endplate of T1 favored to be chronic in nature. Vertebral body heights otherwise maintained. No acute fracture. Soft tissues and spinal canal: Soft tissues of the neck demonstrate no acute abnormality. Prominent vascular calcifications noted about the carotid bifurcations. No prevertebral edema. Disc levels: Moderate multilevel degenerative spondylolysis, greatest at C5-6. Multilevel facet arthrosis noted. Upper chest: Visualized upper mediastinum demonstrates no acute abnormality. Visualized lungs are clear. The IMPRESSION: CT BRAIN: 1. No acute intracranial process identified. 2. Moderate atrophy with chronic microvascular ischemic disease. CT CERVICAL SPINE: 1. No acute traumatic injury within the cervical spine. 2. Moderate multilevel degenerative spondylolysis, greatest at C5-6. Electronically Signed   By: Jeannine Boga M.D.   On: 06/13/2016 19:08   Ct Cervical Spine Wo Contrast  Result Date: 06/13/2016 CLINICAL DATA:  Initial evaluation for acute trauma, fall. EXAM: CT HEAD WITHOUT CONTRAST CT CERVICAL SPINE WITHOUT CONTRAST TECHNIQUE: Multidetector CT imaging of the head and cervical spine was performed following the standard  protocol without intravenous contrast. Multiplanar CT image reconstructions of the cervical spine were also generated. COMPARISON:  Prior CT from 12/04/2013. FINDINGS: CT HEAD FINDINGS Brain: Generalized cerebral atrophy with moderate chronic microvascular ischemic disease. No acute intracranial hemorrhage. No evidence for acute large vessel territory infarct. No mass lesion, midline shift, or mass effect. Ventricular prominence related global parenchymal volume loss of hydrocephalus. No extra-axial fluid collection. Vascular: No hyperdense vessel. Prominent vascular calcifications present within the carotid siphons and distal vertebral arteries. Skull: Scalp soft tissues demonstrate no acute abnormality. Calvarium intact. Sinuses/Orbits: Globes and orbital soft tissues within normal limits. Patient is status post lens extraction bilaterally. Mild mucosal thickening within the right sphenoid sinus. Paranasal sinuses are otherwise clear. No mastoid effusion. CT CERVICAL SPINE FINDINGS Alignment: Grade 1 anterolisthesis of C3 on C4 and C4 on C5, likely related to chronic facet degeneration. Vertebral bodies otherwise normally aligned. Skull base and vertebrae: Skullbase intact. Skullbase intact. Normal C1-2 articulations preserved. Dens is intact. Height loss at the inferior endplate of T1 favored to be chronic in nature. Vertebral body heights otherwise maintained. No acute fracture. Soft tissues and spinal canal: Soft tissues of the neck demonstrate no acute abnormality. Prominent vascular calcifications noted about the carotid bifurcations. No prevertebral edema. Disc levels: Moderate multilevel degenerative spondylolysis, greatest at C5-6. Multilevel facet arthrosis noted. Upper chest: Visualized upper mediastinum demonstrates no acute abnormality. Visualized lungs are clear. The IMPRESSION: CT BRAIN: 1. No acute intracranial process identified. 2. Moderate atrophy with chronic microvascular ischemic disease. CT  CERVICAL SPINE: 1. No acute traumatic injury within the cervical spine. 2. Moderate multilevel degenerative spondylolysis, greatest at C5-6. Electronically Signed   By: Jeannine Boga M.D.   On: 06/13/2016 19:08   Dg Knee Complete 4 Views Left  Result Date: 06/13/2016 CLINICAL DATA:  Fall, deformed left knee. EXAM: LEFT KNEE - COMPLETE 4+ VIEW COMPARISON:  None. FINDINGS: Displaced/comminuted fractures of the proximal left tibia, with probable extension to the midline/medial tibial plateau, and at least some component of impaction within the proximal metaphysis. Additional slightly displaced fracture of the proximal left fibula. Distal femur appears intact and normally aligned. Joint effusion with fat fluid level in the suprapatellar bursa. Prominent atherosclerotic calcifications within the popliteal fossa. IMPRESSION: 1. Displaced/comminuted fractures of the proximal left tibia, with probable extension to  the midline/medial tibial plateau, and impaction within the proximal metaphysis. 2. Slightly displaced fracture of the proximal left fibula. 3. Joint effusion. 4. Atherosclerosis. Electronically Signed   By: Franki Cabot M.D.   On: 06/13/2016 19:28   Dg Hip Unilat W Or Wo Pelvis 2-3 Views Left  Result Date: 06/13/2016 CLINICAL DATA:  Status post fall, with left hip pain. Initial encounter. EXAM: DG HIP (WITH OR WITHOUT PELVIS) 2-3V LEFT COMPARISON:  None. FINDINGS: There are mildly displaced fractures of the left superior and inferior pubic rami. The left femoral head remains seated at the acetabulum. The left femoral neck appears grossly intact. The right hip joint is grossly unremarkable, with pins across the proximal right femur. Degenerative change and changes of vertebroplasty are noted along the lower lumbar spine. Mild sclerosis is seen at the left sacroiliac joint. The visualized bowel gas pattern is grossly unremarkable. Scattered vascular calcifications are seen. IMPRESSION: 1. Mildly  displaced fractures of the left superior and inferior pubic rami. 2. Scattered vascular calcifications seen. Electronically Signed   By: Garald Balding M.D.   On: 06/13/2016 19:27    Procedures Procedures (including critical care time)  Medications Ordered in ED Medications  sodium chloride 0.9 % bolus 500 mL (not administered)  morphine 4 MG/ML injection 4 mg (not administered)  ondansetron (ZOFRAN) injection 4 mg (not administered)     Initial Impression / Assessment and Plan / ED Course  I have reviewed the triage vital signs and the nursing notes.  Pertinent labs & imaging results that were available during my care of the patient were reviewed by me and considered in my medical decision making (see chart for details).     Patient coming from home today after mechanical fall. Patient laid outside 8 hours and when arrived here was hypothermic at 94. She was placed on a bear hugger and started on warm fluids. She is awake alert and oriented. She has significant injury to the left wrist and knee. She is neurovascularly intact at this time. CBC, CMP, CK pending. Patient given pain control. Imaging pending.  7:52 PM Patient's body temperature improved and now she is complaining of being hot. Tachycardia has improved. Imaging is consistent with a tibial plateau fracture, intra-articular distal radius fracture, pubic rami fracture.  9:08 PM Dr. Grandville Silos recommended sugar tong splint and [facility] recommended knee immobilizer.  Pt will be admitted by Dr. Georganna Skeans.  Final Clinical Impressions(s) / ED Diagnoses   Final diagnoses:  Closed fracture of multiple rami of left pubis, initial encounter ([place])  Other closed intra-articular fracture of distal end of left radius, initial encounter  Tibial plateau fracture, left, closed, initial encounter    New Prescriptions New Prescriptions   No medications on file     Blanchie Dessert, MD 06/13/16 [id number]

## 2016-06-13 NOTE — ED Triage Notes (Signed)
Pt from home by [facility] for a fall around 9am today and has been outside since. Pt has deformed left wrist and left knee.

## 2016-06-13 NOTE — ED Notes (Signed)
Report attempted. RN states she will call bed placement to determine appropriate bed assignment, 5N vs 6N

## 2016-06-13 NOTE — ED Notes (Signed)
Patient transported to CT 

## 2016-06-13 NOTE — ED Notes (Signed)
Ortho called back to speak with this RN. Ortho tech informed of need for sugar tong splint and knee immobilizer. He stated, "I will get to it as soon as I can."

## 2016-06-13 NOTE — ED Notes (Signed)
Report attempted to 5N.

## 2016-06-13 NOTE — H&P (Signed)
Vanessa Pace is an 83 y.o. female.   Chief Complaint: L leg and L wrist pain HPI: Vanessa Pace went outside to feed her cats around 9 AM this morning. She is going back into the house and fell down the steps. There are 3 concrete steps leading up to a porch. She fell down and was unable to get up. No loss of consciousness. She lay there from 9 AM until approximately 4 PM. Her daughter, who was home, was asleep at the time and did not hear her calling for help. She was eventually found by another family member and was subsequently evaluated in the emergency department. She was found to have a left wrist fracture, left pubic rami fractures, and the left proximal tib-fib fracture. I was asked to see her for admission to the trauma service. She complains of pain in her left leg and her left wrist.  Past Medical History:  Diagnosis Date  . Arthritis   . Cancer ([place])    skin/face  . Compression fracture of L1 lumbar vertebra ([facility])    L3 and L4  . Dementia   . DVT (deep venous thrombosis) ([place])   . Elbow fracture 12/04/2013   rt elbow  . Family history of anesthesia complication    Daughter is difficult to intubate   . GERD (gastroesophageal reflux disease)   . Macular degeneration   . PONV (postoperative nausea and vomiting)     Past Surgical History:  Procedure Laterality Date  . cataracts     B/L  . COLONOSCOPY W/ BIOPSIES AND POLYPECTOMY    . femur surgery    . FRACTURE SURGERY     right hip  . HIP SURGERY    . KYPHOPLASTY N/A 04/11/2016   Procedure: LUMBAR 1, LUMBAR 3, LUMBAR 4 KYPHOPLASTY;  Surgeon: Phylliss Bob, MD;  Location: [facility];  Service: Orthopedics;  Laterality: N/A;  LUMBAR1, LUMBAR 3, LUMBAR 4 KYPHOPLASTY  . ORIF ELBOW FRACTURE Right 12/06/2013   Procedure: OPEN REDUCTION INTERNAL FIXATION (ORIF) ELBOW/OLECRANON FRACTURE;  Surgeon: Jolyn Nap, MD;  Location: [facility];  Service: Orthopedics;  Laterality: Right;    Family History  Problem Relation Age of Onset  . CAD Father    . CAD Son   . Cervical cancer Daughter    Social History:  reports that she has never smoked. She has never used smokeless tobacco. She reports that she drinks alcohol. She reports that she does not use drugs.  Allergies:  Allergies  Allergen Reactions  . Adhesive [Tape] Other (See Comments)    Tears skin  . Amoxicillin Hives and Rash    Has patient had a PCN reaction causing immediate rash, facial/tongue/throat swelling, SOB or lightheadedness with hypotension: Yes Has patient had a PCN reaction causing severe rash involving mucus membranes or skin necrosis: Yes Has patient had a PCN reaction that required hospitalization No Has patient had a PCN reaction occurring within the last 10 years: No If all of the above answers are "NO", then may proceed with Cephalosporin use.   [name] Kitchen Hydrocodone Nausea Only     (Not in a hospital admission)  Results for orders placed or performed during the hospital encounter of 06/13/16 (from the past 48 hour(s))  CBC with Differential/Platelet     Status: Abnormal   Collection Time: 06/13/16  7:38 PM  Result Value Ref Range   WBC 17.9 (H) 4.0 - 10.5 K/uL   RBC 3.38 (L) 3.87 - 5.11 MIL/uL   Hemoglobin 10.2 (L) 12.0 -  15.0 g/dL   HCT 30.4 (L) 36.0 - 46.0 %   MCV 89.9 78.0 - 100.0 fL   MCH 30.2 26.0 - 34.0 pg   MCHC 33.6 30.0 - 36.0 g/dL   RDW 13.4 11.5 - 15.5 %   Platelets 172 150 - 400 K/uL   Neutrophils Relative % 88 %   Lymphocytes Relative 9 %   Monocytes Relative 3 %   Eosinophils Relative 0 %   Basophils Relative 0 %   Neutro Abs 15.8 (H) 1.7 - 7.7 K/uL   Lymphs Abs 1.6 0.7 - 4.0 K/uL   Monocytes Absolute 0.5 0.1 - 1.0 K/uL   Eosinophils Absolute 0.0 0.0 - 0.7 K/uL   Basophils Absolute 0.0 0.0 - 0.1 K/uL   Smear Review MORPHOLOGY UNREMARKABLE   Comprehensive metabolic panel     Status: Abnormal   Collection Time: 06/13/16  7:38 PM  Result Value Ref Range   Sodium 136 135 - 145 mmol/L   Potassium 4.0 3.5 - 5.1 mmol/L   Chloride 102  101 - 111 mmol/L   CO2 24 22 - 32 mmol/L   Glucose, Bld 126 (H) 65 - 99 mg/dL   BUN 14 6 - 20 mg/dL   Creatinine, Ser 0.77 0.44 - 1.00 mg/dL   Calcium 8.7 (L) 8.9 - 10.3 mg/dL   Total Protein 6.7 6.5 - 8.1 g/dL   Albumin 3.8 3.5 - 5.0 g/dL   AST 37 15 - 41 U/L   ALT 25 14 - 54 U/L   Alkaline Phosphatase 75 38 - 126 U/L   Total Bilirubin 0.6 0.3 - 1.2 mg/dL   GFR calc non Af Amer >60 >60 mL/min   GFR calc Af Amer >60 >60 mL/min    Comment: (NOTE) The eGFR has been calculated using the CKD EPI equation. This calculation has not been validated in all clinical situations. eGFR's persistently <60 mL/min signify possible Chronic Kidney Disease.    Anion gap 10 5 - 15  CK     Status: Abnormal   Collection Time: 06/13/16  7:38 PM  Result Value Ref Range   Total CK 470 (H) 38 - 234 U/L   Dg Elbow Complete Left  Result Date: 06/13/2016 CLINICAL DATA:  Status post fall, with left elbow pain. Initial encounter. EXAM: LEFT ELBOW - COMPLETE 3+ VIEW COMPARISON:  None. FINDINGS: There is no evidence of fracture or dislocation. The visualized joint spaces are preserved. No significant joint effusion is identified. Mild soft tissue swelling is noted overlying the olecranon. IMPRESSION: No evidence of fracture or dislocation. Electronically Signed   By: Garald Balding M.D.   On: 06/13/2016 19:28   Dg Wrist Complete Left  Result Date: 06/13/2016 CLINICAL DATA:  [place] at home around 09:00. EXAM: LEFT WRIST - COMPLETE 3+ VIEW COMPARISON:  None. FINDINGS: There is an intra-articular comminuted fracture of the distal radius with moderate impaction. There is slight irregularity of the ulnar styloid, and nondisplaced ulnar styloid fracture cannot be excluded. There is scapholunate dissociation, indeterminate chronicity. There is moderate arthritic change of the first carpometacarpal articulation. IMPRESSION: 1. Acute intra-articular fracture of the distal radius with moderate comminution and impaction. 2.  Possible nondisplaced ulnar styloid fracture. 3. Scapholunate dissociation of indeterminate chronicity. Electronically Signed   By: Andreas Newport M.D.   On: 06/13/2016 19:28   Dg Tibia/fibula Left  Result Date: 06/13/2016 CLINICAL DATA:  Status post fall, with left knee deformity. Initial encounter. EXAM: LEFT TIBIA AND FIBULA - 2 VIEW COMPARISON:  None. FINDINGS: The comminuted fractures of the proximal tibia and fibula are better characterized on concurrent knee radiographs. No additional fractures are seen. The ankle mortise is incompletely assessed, but appears grossly unremarkable. Mild diffuse soft tissue swelling is noted about the left lower leg. A large lipohemarthrosis is seen at the left knee joint. There is osteopenia of visualized osseous structures. Diffuse vascular calcifications are seen. IMPRESSION: 1. Comminuted fractures of the proximal tibia and fibula, better characterized on concurrent knee radiographs. 2. Large lipohemarthrosis at the left knee joint. 3. Diffuse vascular calcifications seen. 4. Osteopenia of visualized osseous structures. Electronically Signed   By: Garald Balding M.D.   On: 06/13/2016 19:30   Ct Head Wo Contrast  Result Date: 06/13/2016 CLINICAL DATA:  Initial evaluation for acute trauma, fall. EXAM: CT HEAD WITHOUT CONTRAST CT CERVICAL SPINE WITHOUT CONTRAST TECHNIQUE: Multidetector CT imaging of the head and cervical spine was performed following the standard protocol without intravenous contrast. Multiplanar CT image reconstructions of the cervical spine were also generated. COMPARISON:  Prior CT from 12/04/2013. FINDINGS: CT HEAD FINDINGS Brain: Generalized cerebral atrophy with moderate chronic microvascular ischemic disease. No acute intracranial hemorrhage. No evidence for acute large vessel territory infarct. No mass lesion, midline shift, or mass effect. Ventricular prominence related global parenchymal volume loss of hydrocephalus. No extra-axial fluid  collection. Vascular: No hyperdense vessel. Prominent vascular calcifications present within the carotid siphons and distal vertebral arteries. Skull: Scalp soft tissues demonstrate no acute abnormality. Calvarium intact. Sinuses/Orbits: Globes and orbital soft tissues within normal limits. Patient is status post lens extraction bilaterally. Mild mucosal thickening within the right sphenoid sinus. Paranasal sinuses are otherwise clear. No mastoid effusion. CT CERVICAL SPINE FINDINGS Alignment: Grade 1 anterolisthesis of C3 on C4 and C4 on C5, likely related to chronic facet degeneration. Vertebral bodies otherwise normally aligned. Skull base and vertebrae: Skullbase intact. Skullbase intact. Normal C1-2 articulations preserved. Dens is intact. Height loss at the inferior endplate of T1 favored to be chronic in nature. Vertebral body heights otherwise maintained. No acute fracture. Soft tissues and spinal canal: Soft tissues of the neck demonstrate no acute abnormality. Prominent vascular calcifications noted about the carotid bifurcations. No prevertebral edema. Disc levels: Moderate multilevel degenerative spondylolysis, greatest at C5-6. Multilevel facet arthrosis noted. Upper chest: Visualized upper mediastinum demonstrates no acute abnormality. Visualized lungs are clear. The IMPRESSION: CT BRAIN: 1. No acute intracranial process identified. 2. Moderate atrophy with chronic microvascular ischemic disease. CT CERVICAL SPINE: 1. No acute traumatic injury within the cervical spine. 2. Moderate multilevel degenerative spondylolysis, greatest at C5-6. Electronically Signed   By: Jeannine Boga M.D.   On: 06/13/2016 19:08   Ct Cervical Spine Wo Contrast  Result Date: 06/13/2016 CLINICAL DATA:  Initial evaluation for acute trauma, fall. EXAM: CT HEAD WITHOUT CONTRAST CT CERVICAL SPINE WITHOUT CONTRAST TECHNIQUE: Multidetector CT imaging of the head and cervical spine was performed following the standard  protocol without intravenous contrast. Multiplanar CT image reconstructions of the cervical spine were also generated. COMPARISON:  Prior CT from 12/04/2013. FINDINGS: CT HEAD FINDINGS Brain: Generalized cerebral atrophy with moderate chronic microvascular ischemic disease. No acute intracranial hemorrhage. No evidence for acute large vessel territory infarct. No mass lesion, midline shift, or mass effect. Ventricular prominence related global parenchymal volume loss of hydrocephalus. No extra-axial fluid collection. Vascular: No hyperdense vessel. Prominent vascular calcifications present within the carotid siphons and distal vertebral arteries. Skull: Scalp soft tissues demonstrate no acute abnormality. Calvarium intact. Sinuses/Orbits: Globes and orbital soft tissues  within normal limits. Patient is status post lens extraction bilaterally. Mild mucosal thickening within the right sphenoid sinus. Paranasal sinuses are otherwise clear. No mastoid effusion. CT CERVICAL SPINE FINDINGS Alignment: Grade 1 anterolisthesis of C3 on C4 and C4 on C5, likely related to chronic facet degeneration. Vertebral bodies otherwise normally aligned. Skull base and vertebrae: Skullbase intact. Skullbase intact. Normal C1-2 articulations preserved. Dens is intact. Height loss at the inferior endplate of T1 favored to be chronic in nature. Vertebral body heights otherwise maintained. No acute fracture. Soft tissues and spinal canal: Soft tissues of the neck demonstrate no acute abnormality. Prominent vascular calcifications noted about the carotid bifurcations. No prevertebral edema. Disc levels: Moderate multilevel degenerative spondylolysis, greatest at C5-6. Multilevel facet arthrosis noted. Upper chest: Visualized upper mediastinum demonstrates no acute abnormality. Visualized lungs are clear. The IMPRESSION: CT BRAIN: 1. No acute intracranial process identified. 2. Moderate atrophy with chronic microvascular ischemic disease. CT  CERVICAL SPINE: 1. No acute traumatic injury within the cervical spine. 2. Moderate multilevel degenerative spondylolysis, greatest at C5-6. Electronically Signed   By: Jeannine Boga M.D.   On: 06/13/2016 19:08   Dg Knee Complete 4 Views Left  Result Date: 06/13/2016 CLINICAL DATA:  Fall, deformed left knee. EXAM: LEFT KNEE - COMPLETE 4+ VIEW COMPARISON:  None. FINDINGS: Displaced/comminuted fractures of the proximal left tibia, with probable extension to the midline/medial tibial plateau, and at least some component of impaction within the proximal metaphysis. Additional slightly displaced fracture of the proximal left fibula. Distal femur appears intact and normally aligned. Joint effusion with fat fluid level in the suprapatellar bursa. Prominent atherosclerotic calcifications within the popliteal fossa. IMPRESSION: 1. Displaced/comminuted fractures of the proximal left tibia, with probable extension to the midline/medial tibial plateau, and impaction within the proximal metaphysis. 2. Slightly displaced fracture of the proximal left fibula. 3. Joint effusion. 4. Atherosclerosis. Electronically Signed   By: Franki Cabot M.D.   On: 06/13/2016 19:28   Dg Hip Unilat W Or Wo Pelvis 2-3 Views Left  Result Date: 06/13/2016 CLINICAL DATA:  Status post fall, with left hip pain. Initial encounter. EXAM: DG HIP (WITH OR WITHOUT PELVIS) 2-3V LEFT COMPARISON:  None. FINDINGS: There are mildly displaced fractures of the left superior and inferior pubic rami. The left femoral head remains seated at the acetabulum. The left femoral neck appears grossly intact. The right hip joint is grossly unremarkable, with pins across the proximal right femur. Degenerative change and changes of vertebroplasty are noted along the lower lumbar spine. Mild sclerosis is seen at the left sacroiliac joint. The visualized bowel gas pattern is grossly unremarkable. Scattered vascular calcifications are seen. IMPRESSION: 1. Mildly  displaced fractures of the left superior and inferior pubic rami. 2. Scattered vascular calcifications seen. Electronically Signed   By: Garald Balding M.D.   On: 06/13/2016 19:27    Review of Systems  Constitutional: Negative for chills and fever.  HENT: Positive for hearing loss.   Eyes: Negative for blurred vision.  Respiratory: Negative for cough and shortness of breath.   Cardiovascular: Negative for chest pain.  Gastrointestinal: Negative for abdominal pain, nausea and vomiting.  Genitourinary: Negative.   Musculoskeletal:       See HPI  Skin:       Thin and fragile  Neurological: Negative for sensory change and loss of consciousness.  Endo/Heme/Allergies: Negative.   Psychiatric/Behavioral: Negative.     Blood pressure 113/74, pulse 102, temperature (!) 96.7 F (35.9 C), temperature source Rectal, resp. rate 12,  height 5' (1.524 m), weight 38.6 kg (85 lb), SpO2 93 %. Physical Exam  Constitutional: She is oriented to person, place, and time. She appears well-developed. No distress.  HENT:  Right Ear: External ear normal.  Left Ear: External ear normal.  Nose: Nose normal.  Mouth/Throat: Oropharynx is clear and moist.  Eyes: EOM are normal. Pupils are equal, round, and reactive to light.  Neck:  No posterior midline tenderness, no pain on active range of motion  Cardiovascular: Normal rate and normal heart sounds.   Respiratory: Effort normal and breath sounds normal. No respiratory distress. She has no wheezes. She has no rales.  GI: Soft. She exhibits no distension. There is no tenderness. There is no rebound and no guarding.  Musculoskeletal:       Arms:      Legs: Tender deformity left wrist with some erythema laterally on the dorsal side, tender deformity left proximal tib-fib, calf is soft. Decent capillary refill in left fingers and left toes.  Neurological: She is alert and oriented to person, place, and time. She displays no atrophy and no tremor. She exhibits  normal muscle tone. She displays no seizure activity. GCS eye subscore is 4. GCS verbal subscore is 5. GCS motor subscore is 6.  MAE but limited due to pain on left side  Skin: Skin is warm.  Psychiatric: She has a normal mood and affect.     Assessment/Plan Fall Left wrist fracture - splint, Dr. Milly Jakob to consult Left superior and inferior pubic rami fractures - Dr. Stann Mainland to consult Left proximal tib-fib fracture - Dr. Stann Mainland to consult  Admit to Trauma, I spoke with the family as well.  Zenovia Jarred, MD 06/13/2016, 9:50 PM

## 2016-06-13 NOTE — Progress Notes (Signed)
Orthopedic Tech Progress Note Patient Details:  Vanessa Pace [birth year] [id number]  Ortho Devices Type of Ortho Device: Ace wrap, Arm sling, Sugartong splint, Knee Immobilizer Ortho Device/Splint Location: LUE sug, LLE KI Ortho Device/Splint Interventions: Ordered, Application   Braulio Bosch 06/13/2016, 10:04 PM

## 2016-06-13 NOTE — ED Notes (Signed)
ORTHO PAGED  

## 2016-06-13 NOTE — ED Notes (Signed)
Report attempted to 5N. No answer x 2

## 2016-06-14 LAB — CBC
HCT: 28.2 % — ABNORMAL LOW (ref 36.0–46.0)
Hemoglobin: 9.2 g/dL — ABNORMAL LOW (ref 12.0–15.0)
MCH: 29.6 pg (ref 26.0–34.0)
MCHC: 32.6 g/dL (ref 30.0–36.0)
MCV: 90.7 fL (ref 78.0–100.0)
PLATELETS: 167 10*3/uL (ref 150–400)
RBC: 3.11 MIL/uL — ABNORMAL LOW (ref 3.87–5.11)
RDW: 13.5 % (ref 11.5–15.5)
WBC: 13.1 10*3/uL — ABNORMAL HIGH (ref 4.0–10.5)

## 2016-06-14 LAB — BASIC METABOLIC PANEL
Anion gap: 8 (ref 5–15)
BUN: 19 mg/dL (ref 6–20)
CHLORIDE: 103 mmol/L (ref 101–111)
CO2: 26 mmol/L (ref 22–32)
CREATININE: 0.83 mg/dL (ref 0.44–1.00)
Calcium: 8.7 mg/dL — ABNORMAL LOW (ref 8.9–10.3)
GFR calc Af Amer: >60 mL/min (ref >60)
GLUCOSE: 120 mg/dL — AB (ref 65–99)
Potassium: 4.7 mmol/L (ref 3.5–5.1)
SODIUM: 137 mmol/L (ref 135–145)

## 2016-06-14 MED ORDER — TRAMADOL HCL 50 MG PO TABS
50.0000 mg | ORAL_TABLET | Freq: Four times a day (QID) | ORAL | Status: DC | PRN
  Administered 2016-06-14 – 2016-06-15 (×2): 50 mg via ORAL
  Filled 2016-06-14 (×2): qty 1

## 2016-06-14 MED ORDER — ACETAMINOPHEN 325 MG PO TABS: 650.0000 mg | ORAL_TABLET | Freq: Four times a day (QID) | ORAL | Status: DC | PRN

## 2016-06-14 NOTE — Evaluation (Signed)
Physical Therapy Evaluation Patient Details Name: Vanessa Pace MRN: [id number] DOB: Jul 19, 1926 Today's Date: 06/14/2016   History of Present Illness  Pt is an 83 y/o female who fell feeding cats and was found to have a left wrist fracture, left pubic rami fractures, and the left proximal tib-fib fracture. Pt has a past medical history that includes Arthritis; Cancer; Compression fracture of L1 lumbar vertebra; Dementia; DVT; Elbow fracture (12/04/2013); Macular degeneration, and HOH. Pt has a past surgical history that includes Fracture surgery; femur surgery; ORIF elbow fracture (Right, 12/06/2013); cataracts; Hip surgery; and Kyphoplasty (04/11/2016).  Clinical Impression  Pt was admitted through ED yesterday with the above diagnosis. Prior to admission, pt was living with her daughter who has insomnia and PTSD and takes medications to help her sleep. Pt was on the ground outside her home from 9 AM until 4 PM when her daughter woke up and found her. Pt's other daughter was present during the evaluation and reports that her and her brother will be trying to take over caring for pt when she returns home. Pt and family in agreement to SNF placement upon discharge in order to maximize her functional outcomes due to significant deficits listed below. Pt will benefit from continued skilled PT services to address the below deficits in order to assist with a smooth transition home.     Follow Up Recommendations SNF    Equipment Recommendations  None recommended by PT    Recommendations for Other Services       Precautions / Restrictions Precautions Precautions: Fall Precaution Comments: Pt had a fall that resulted in current injury. Has had multiple falls in the past year Required Braces or Orthoses: Knee Immobilizer - Left Knee Immobilizer - Left: On at all times Restrictions Weight Bearing Restrictions: Yes LUE Weight Bearing: Weight bear through elbow only (WBAT platform RW) LLE Weight  Bearing: Touchdown weight bearing Other Position/Activity Restrictions: AAROM and gentle PROM L knee      Mobility  Bed Mobility Overal bed mobility: Needs Assistance Bed Mobility: Supine to Sit;Sit to Supine     Supine to sit: Max assist;+2 for physical assistance;+2 for safety/equipment;HOB elevated Sit to supine: Max assist;+2 for physical assistance;+2 for safety/equipment   General bed mobility comments: max assist for BLE and support for trunk to and from EOB  Transfers Overall transfer level: Needs assistance Equipment used: None Transfers: Sit to/from [facility] Sit to Stand: Max assist;+2 physical assistance (1 therapist supported LLE ) Stand pivot transfers: +2 physical assistance;Max assist (1 therapist supported LLE)       General transfer comment: better pain control when LLE held off ground, Pt is petite enough for +1 for transfers, max cues and max physical assist  Ambulation/Gait             General Gait Details: unable to assess this session due to pain  Stairs            Wheelchair Mobility    Modified Rankin (Stroke Patients Only)       Balance Overall balance assessment: History of Falls;Needs assistance Sitting-balance support: Single extremity supported;Feet supported Sitting balance-Leahy Scale: Poor Sitting balance - Comments: initial posterior lean that resolved as we sat EOB Postural control: Posterior lean Standing balance support: Bilateral upper extremity supported Standing balance-Leahy Scale: Zero Standing balance comment: relies on clinicians for support throughout transfer  Pertinent Vitals/Pain Pain Assessment: Faces Faces Pain Scale: Hurts whole lot Pain Location: LUE, pelvis, and LLE (knee cap) Pain Descriptors / Indicators: Grimacing;Guarding;Sore;Sharp;Pressure;Moaning Pain Intervention(s): Limited activity within patient's tolerance;Monitored during  session;Repositioned    Home Living Family/patient expects to be discharged to:: Skilled nursing facility Living Arrangements: Children               Additional Comments: lives in single level home and has steps leading into house. Lives with daugther who has insomnia and PTSD. Other daughter and son are assisted with care at this time.     Prior Function Level of Independence: Needs assistance   Gait / Transfers Assistance Needed: was using SPC for mobility  ADL's / Homemaking Assistance Needed: daughter assisted with bathing in tub shower        Hand Dominance   Dominant Hand: Right    Extremity/Trunk Assessment   Upper Extremity Assessment Upper Extremity Assessment: Defer to OT evaluation LUE: Unable to fully assess due to immobilization;Unable to fully assess due to pain    Lower Extremity Assessment Lower Extremity Assessment: LLE deficits/detail LLE: Unable to fully assess due to pain;Unable to fully assess due to immobilization    Cervical / Trunk Assessment Cervical / Trunk Assessment: Kyphotic;Other exceptions Cervical / Trunk Exceptions: history of lumbar spine compression fractures from severe OA.   Communication   Communication: HOH  Cognition Arousal/Alertness: Awake/alert (Daughter reports that pt is impacted by medications) Behavior During Therapy: WFL for tasks assessed/performed Overall Cognitive Status: History of cognitive impairments - at baseline                      General Comments General comments (skin integrity, edema, etc.): Pt's daughter in room for entire session    Exercises     Assessment/Plan    PT Assessment Patient needs continued PT services  PT Problem List Decreased strength;Decreased range of motion;Decreased activity tolerance;Decreased balance;Decreased mobility;Decreased knowledge of use of DME;Decreased knowledge of precautions;Pain          PT Treatment Interventions Gait training;Functional mobility  training;Therapeutic activities;Therapeutic exercise;Patient/family education    PT Goals (Current goals can be found in the Care Plan section)  Acute Rehab PT Goals Patient Stated Goal: to get better PT Goal Formulation: With patient/family Time For Goal Achievement: 06/21/16 Potential to Achieve Goals: Fair    Frequency Min 3X/week   Barriers to discharge Decreased caregiver support will require 24 hour caregiver assistance. Family will not be able to provide and SNF placement will be the best option initially.     Co-evaluation PT/OT/SLP Co-Evaluation/Treatment: Yes Reason for Co-Treatment: For patient/therapist safety;To address functional/ADL transfers PT goals addressed during session: Mobility/safety with mobility OT goals addressed during session: ADL's and self-care       End of Session Equipment Utilized During Treatment: Gait belt Activity Tolerance: Patient limited by pain;Patient limited by fatigue Patient left: in bed;with call bell/phone within reach;with family/visitor present Nurse Communication: Mobility status         Time: [id number] PT Time Calculation (min) (ACUTE ONLY): 39 min   Charges:   PT Evaluation $PT Eval Moderate Complexity: 1 Procedure PT Treatments $Therapeutic Activity: 8-22 mins   PT G Codes:        Scheryl Marten PT, DPT  [phone]  06/14/2016, 4:51 PM

## 2016-06-14 NOTE — Evaluation (Signed)
Occupational Therapy Evaluation Patient Details Name: Vanessa Pace MRN: [id number] DOB: [birth year] Today's Date: 06/14/2016    History of Present Illness Pt is an 83 y/o female who fell feeding cats and was found to have a left wrist fracture, left pubic rami fractures, and the left proximal tib-fib fracture. Pt has a past medical history that includes Arthritis; Cancer; Compression fracture of L1 lumbar vertebra; Dementia; DVT; Elbow fracture (12/04/2013); Macular degeneration, and HOH. Pt has a past surgical history that includes Fracture surgery; femur surgery; ORIF elbow fracture (Right, 12/06/2013); cataracts; Hip surgery; and Kyphoplasty (04/11/2016).   Clinical Impression   PTA Pt needed assist with bathing, but otherwise was independent in ADL and used a SPC PRN for mobility (with a history of falls) Pt currently max A for ADL and max A for stand pivot transfers. Pt will benefit from skilled OT in the acute care setting to maximize independence and safety in ADL and transfers. Pt will require SNF level therapy to return Pt to PLOF and also assist with caregiver burden. Next session to focus on BSC and maintaining weight bearing precautions.    Follow Up Recommendations  SNF;Supervision/Assistance - 24 hour    Equipment Recommendations  Other (comment) (defer to next venue)    Recommendations for Other Services       Precautions / Restrictions Precautions Precautions: Fall Required Braces or Orthoses: Knee Immobilizer - Left Knee Immobilizer - Left: On at all times Restrictions Weight Bearing Restrictions: Yes LUE Weight Bearing: Weight bear through elbow only (WBAT through platform) LLE Weight Bearing: Touchdown weight bearing Other Position/Activity Restrictions: AAROM and gentle PROM L knee      Mobility Bed Mobility Overal bed mobility: Needs Assistance Bed Mobility: Supine to Sit;Sit to Supine     Supine to sit: Max assist;+2 for physical assistance;+2 for  safety/equipment;HOB elevated Sit to supine: Max assist;+2 for physical assistance;+2 for safety/equipment   General bed mobility comments: max assist for BLE and support for trunk to and from EOB  Transfers Overall transfer level: Needs assistance Equipment used: None Transfers: Sit to/from [employer] Transfers Sit to Stand: +2 safety/equipment;Max assist (1 therapist supported LLE) Stand pivot transfers: Max assist;+2 safety/equipment (one therapist supported LLE)       General transfer comment: better pain control when LLE held off ground, Pt is petite enough for +1 for transfers, max cues and max physical assist    Balance Overall balance assessment: History of Falls;Needs assistance Sitting-balance support: Single extremity supported;Feet supported Sitting balance-Leahy Scale: Poor Sitting balance - Comments: initial posterior lean that resolved as we sat EOB     Standing balance-Leahy Scale: Zero                              ADL Overall ADL's : Needs assistance/impaired     Grooming: Moderate assistance;Bed level   Upper Body Bathing: Moderate assistance;Bed level   Lower Body Bathing: Total assistance;Bed level   Upper Body Dressing : Maximal assistance;Bed level   Lower Body Dressing: Total assistance;Bed level   Toilet Transfer: +2 for safety/equipment;Stand-pivot;BSC;Maximal assistance (only requires 1 person lift) Toilet Transfer Details (indicate cue type and reason): stand pivot to [facility] with one therapist holding up LLE off ground for pain control Toileting- Clothing Manipulation and Hygiene: Total assistance;Sit to/from stand Toileting - Clothing Manipulation Details (indicate cue type and reason): therapists managed hospital gown and peri care     Functional mobility during ADLs:  (  not attempted this session)       Vision     Perception     Praxis      Pertinent Vitals/Pain Pain Assessment: Faces Faces Pain Scale: Hurts whole  lot Pain Location: LUE, pelvis, and LLE (knee cap) Pain Descriptors / Indicators: Grimacing;Guarding;Sore;Sharp;Pressure Pain Intervention(s): Limited activity within patient's tolerance;Monitored during session;Repositioned     Hand Dominance Right   Extremity/Trunk Assessment Upper Extremity Assessment Upper Extremity Assessment: LUE deficits/detail;Generalized weakness LUE: Unable to fully assess due to immobilization;Unable to fully assess due to pain   Lower Extremity Assessment Lower Extremity Assessment: LLE deficits/detail;Generalized weakness LLE: Unable to fully assess due to pain;Unable to fully assess due to immobilization   Cervical / Trunk Assessment Cervical / Trunk Assessment: Kyphotic   Communication Communication Communication: HOH   Cognition Arousal/Alertness: Awake/alert (Daughter reports that the medicines have impacted her) Behavior During Therapy: WFL for tasks assessed/performed Overall Cognitive Status: History of cognitive impairments - at baseline                     General Comments       Exercises       Shoulder Instructions      Home Living Family/patient expects to be discharged to:: Skilled nursing facility Living Arrangements: Children                               Additional Comments: lives in single level home and has steps leading into house. Lives with daugther who has insomnia and PTSD. Other daughter and son are assisted with care at this time.       Prior Functioning/Environment Level of Independence: Needs assistance  Gait / Transfers Assistance Needed: was using SPC for mobility ADL's / Homemaking Assistance Needed: daughter assisted with bathing in tub shower            OT Problem List: Decreased strength;Decreased range of motion;Decreased activity tolerance;Impaired balance (sitting and/or standing);Decreased safety awareness;Decreased knowledge of use of DME or AE;Decreased knowledge of  precautions;Pain;Impaired UE functional use   OT Treatment/Interventions: Self-care/ADL training;Therapeutic exercise;DME and/or AE instruction;Therapeutic activities;Patient/family education;Balance training    OT Goals(Current goals can be found in the care plan section) Acute Rehab OT Goals Patient Stated Goal: to get better OT Goal Formulation: With patient Time For Goal Achievement: 06/28/16 Potential to Achieve Goals: Fair ADL Goals Pt Will Perform Grooming: with min guard assist;sitting Pt Will Perform Upper Body Dressing: with min guard assist;with caregiver independent in assisting;sitting Pt Will Perform Lower Body Dressing: with min assist;with caregiver independent in assisting;with adaptive equipment;sit to/from stand Pt Will Transfer to Toilet: with min assist;stand pivot transfer;bedside commode (caregiver independent in assisting) Pt Will Perform Toileting - Clothing Manipulation and hygiene: with min assist;sit to/from stand;with caregiver independent in assisting;with adaptive equipment  OT Frequency: Min 2X/week   Barriers to D/C:            Co-evaluation PT/OT/SLP Co-Evaluation/Treatment: Yes Reason for Co-Treatment: For patient/therapist safety;To address functional/ADL transfers PT goals addressed during session: Mobility/safety with mobility OT goals addressed during session: ADL's and self-care      End of Session Equipment Utilized During Treatment: Gait belt;Left knee immobilizer Nurse Communication: Mobility status;Weight bearing status;Other (comment) (Pt able to urinate in [facility])  Activity Tolerance: Patient limited by pain;Patient limited by fatigue Patient left: in bed;with call bell/phone within reach;with bed alarm set;with family/visitor present   Time: [id number] OT Time Calculation (min): 39  min Charges:  OT General Charges $OT Visit: 1 Procedure OT Evaluation $OT Eval Moderate Complexity: 1 Procedure G-Codes:    Merri Ray Horace Wishon 06-25-16,  4:42 PM Hulda Humphrey OTR/L [phone]

## 2016-06-14 NOTE — Care Management Note (Signed)
Case Management Note  Patient Details  Name: Vanessa Pace MRN: [id number] Date of Birth: [birth year]  Subjective/Objective:   Pt admitted on 06/13/16 s/p fall with Lt wrist, Lt sup/inf pubic rami, and lt prox tib fib fractures.  PTA, pt resided at home with family.  (2 daughters and a son.)                 Action/Plan: Met with daughters Vanessa Pace and Vanessa Pace) to discuss discharge planning.  Per daughters, pt has hx of falls in the home.  Family is able to provide 24h supervision, but primary caregiver, Vanessa Pace, is not sure she can manage pt with her injuries, as she has her own health issues.  PT/OT evaluations are currently pending to evaluate for possible SNF.  Will consult CSW to follow through the weekend should SNF be recommended.    Expected Discharge Date:                  Expected Discharge Plan:  Skilled Nursing Facility  In-House Referral:  Clinical Social Work  Discharge planning Services  CM Consult  Post Acute Care Choice:    Choice offered to:     DME Arranged:    DME Agency:     HH Arranged:    [place] Agency:     Status of Service:  In process, will continue to follow  If discussed at Long Length of Stay Meetings, dates discussed:    Additional Comments:  Reinaldo Raddle, RN, BSN  Trauma/Neuro ICU Case Manager [phone]

## 2016-06-14 NOTE — Consult Note (Signed)
ORTHOPAEDIC CONSULTATION HISTORY & PHYSICAL REQUESTING PHYSICIAN: Trauma Md, MD  Chief Complaint: Multiple fractures  HPI: Vanessa RAYMON is a 83 y.o. female who experienced an unwitnessed fall at home today.  She apparently was bending down, and outside, for several hours before being discovered.  He presented to the emergency department for evaluation, and workup revealed a left distal radius fracture, left pelvic fracture, and left tibial plateau fracture.  She has been admitted for observation, analgesia, and determination for fitness for home environment versus skilled nursing placement  Past Medical History:  Diagnosis Date  . Arthritis   . Cancer ([place])    skin/face  . Compression fracture of L1 lumbar vertebra ([facility])    L3 and L4  . Dementia   . DVT (deep venous thrombosis) ([place])   . Elbow fracture 12/04/2013   rt elbow  . Family history of anesthesia complication    Daughter is difficult to intubate   . GERD (gastroesophageal reflux disease)   . Macular degeneration   . PONV (postoperative nausea and vomiting)    Past Surgical History:  Procedure Laterality Date  . cataracts     B/L  . COLONOSCOPY W/ BIOPSIES AND POLYPECTOMY    . femur surgery    . FRACTURE SURGERY     right hip  . HIP SURGERY    . KYPHOPLASTY N/A 04/11/2016   Procedure: LUMBAR 1, LUMBAR 3, LUMBAR 4 KYPHOPLASTY;  Surgeon: Phylliss Bob, MD;  Location: [facility];  Service: Orthopedics;  Laterality: N/A;  LUMBAR1, LUMBAR 3, LUMBAR 4 KYPHOPLASTY  . ORIF ELBOW FRACTURE Right 12/06/2013   Procedure: OPEN REDUCTION INTERNAL FIXATION (ORIF) ELBOW/OLECRANON FRACTURE;  Surgeon: Jolyn Nap, MD;  Location: [facility];  Service: Orthopedics;  Laterality: Right;   Social History   Social History  . Marital status: Single    Spouse name: N/A  . Number of children: N/A  . Years of education: N/A   Social History Main Topics  . Smoking status: Never Smoker  . Smokeless tobacco: Never Used     Comment: exposed to  2nd hand smoke   . Alcohol use Yes     Comment: occasionally  . Drug use: No  . Sexual activity: Not Asked   Other Topics Concern  . None   Social History Narrative  . None   Family History  Problem Relation Age of Onset  . CAD Father   . CAD Son   . Cervical cancer Daughter    Allergies  Allergen Reactions  . Adhesive [Tape] Other (See Comments)    Tears skin  . Amoxicillin Hives and Rash    Has patient had a PCN reaction causing immediate rash, facial/tongue/throat swelling, SOB or lightheadedness with hypotension: Yes Has patient had a PCN reaction causing severe rash involving mucus membranes or skin necrosis: Yes Has patient had a PCN reaction that required hospitalization No Has patient had a PCN reaction occurring within the last 10 years: No If all of the above answers are "NO", then may proceed with Cephalosporin use.   [name] Kitchen Hydrocodone Nausea Only   Prior to Admission medications   Medication Sig Start Date End Date Taking? Authorizing Provider  diazepam (VALIUM) 5 MG tablet Take 0.5 mg by mouth every 6 (six) hours as needed for muscle spasms.   Yes Historical Provider, MD  DiphenhydrAMINE HCl, Sleep, (UNISOM SLEEPGELS) 50 MG CAPS Take 50 mg by mouth at bedtime.   Yes Historical Provider, MD  Multiple Vitamins-Minerals (ICAPS AREDS 2 PO)  Take 1 capsule by mouth 3 (three) times a week.   Yes Historical Provider, MD  diclofenac sodium (VOLTAREN) 1 % GEL Apply 2 g topically 3 (three) times daily. Patient not taking: Reported on 06/13/2016 12/21/13   Lavon Paganini Angiulli, PA-C  polyethylene glycol (MIRALAX / GLYCOLAX) packet Take 17 g by mouth daily. Patient not taking: Reported on 06/13/2016 12/09/13   Reyne Dumas, MD   Dg Elbow Complete Left  Result Date: 06/13/2016 CLINICAL DATA:  Status post fall, with left elbow pain. Initial encounter. EXAM: LEFT ELBOW - COMPLETE 3+ VIEW COMPARISON:  None. FINDINGS: There is no evidence of fracture or dislocation. The visualized joint  spaces are preserved. No significant joint effusion is identified. Mild soft tissue swelling is noted overlying the olecranon. IMPRESSION: No evidence of fracture or dislocation. Electronically Signed   By: Garald Balding M.D.   On: 06/13/2016 19:28   Dg Wrist Complete Left  Result Date: 06/13/2016 CLINICAL DATA:  [place] at home around 09:00. EXAM: LEFT WRIST - COMPLETE 3+ VIEW COMPARISON:  None. FINDINGS: There is an intra-articular comminuted fracture of the distal radius with moderate impaction. There is slight irregularity of the ulnar styloid, and nondisplaced ulnar styloid fracture cannot be excluded. There is scapholunate dissociation, indeterminate chronicity. There is moderate arthritic change of the first carpometacarpal articulation. IMPRESSION: 1. Acute intra-articular fracture of the distal radius with moderate comminution and impaction. 2. Possible nondisplaced ulnar styloid fracture. 3. Scapholunate dissociation of indeterminate chronicity. Electronically Signed   By: Andreas Newport M.D.   On: 06/13/2016 19:28   Dg Tibia/fibula Left  Result Date: 06/13/2016 CLINICAL DATA:  Status post fall, with left knee deformity. Initial encounter. EXAM: LEFT TIBIA AND FIBULA - 2 VIEW COMPARISON:  None. FINDINGS: The comminuted fractures of the proximal tibia and fibula are better characterized on concurrent knee radiographs. No additional fractures are seen. The ankle mortise is incompletely assessed, but appears grossly unremarkable. Mild diffuse soft tissue swelling is noted about the left lower leg. A large lipohemarthrosis is seen at the left knee joint. There is osteopenia of visualized osseous structures. Diffuse vascular calcifications are seen. IMPRESSION: 1. Comminuted fractures of the proximal tibia and fibula, better characterized on concurrent knee radiographs. 2. Large lipohemarthrosis at the left knee joint. 3. Diffuse vascular calcifications seen. 4. Osteopenia of visualized osseous  structures. Electronically Signed   By: Garald Balding M.D.   On: 06/13/2016 19:30   Ct Head Wo Contrast  Result Date: 06/13/2016 CLINICAL DATA:  Initial evaluation for acute trauma, fall. EXAM: CT HEAD WITHOUT CONTRAST CT CERVICAL SPINE WITHOUT CONTRAST TECHNIQUE: Multidetector CT imaging of the head and cervical spine was performed following the standard protocol without intravenous contrast. Multiplanar CT image reconstructions of the cervical spine were also generated. COMPARISON:  Prior CT from 12/04/2013. FINDINGS: CT HEAD FINDINGS Brain: Generalized cerebral atrophy with moderate chronic microvascular ischemic disease. No acute intracranial hemorrhage. No evidence for acute large vessel territory infarct. No mass lesion, midline shift, or mass effect. Ventricular prominence related global parenchymal volume loss of hydrocephalus. No extra-axial fluid collection. Vascular: No hyperdense vessel. Prominent vascular calcifications present within the carotid siphons and distal vertebral arteries. Skull: Scalp soft tissues demonstrate no acute abnormality. Calvarium intact. Sinuses/Orbits: Globes and orbital soft tissues within normal limits. Patient is status post lens extraction bilaterally. Mild mucosal thickening within the right sphenoid sinus. Paranasal sinuses are otherwise clear. No mastoid effusion. CT CERVICAL SPINE FINDINGS Alignment: Grade 1 anterolisthesis of C3 on C4 and C4 on  C5, likely related to chronic facet degeneration. Vertebral bodies otherwise normally aligned. Skull base and vertebrae: Skullbase intact. Skullbase intact. Normal C1-2 articulations preserved. Dens is intact. Height loss at the inferior endplate of T1 favored to be chronic in nature. Vertebral body heights otherwise maintained. No acute fracture. Soft tissues and spinal canal: Soft tissues of the neck demonstrate no acute abnormality. Prominent vascular calcifications noted about the carotid bifurcations. No prevertebral  edema. Disc levels: Moderate multilevel degenerative spondylolysis, greatest at C5-6. Multilevel facet arthrosis noted. Upper chest: Visualized upper mediastinum demonstrates no acute abnormality. Visualized lungs are clear. The IMPRESSION: CT BRAIN: 1. No acute intracranial process identified. 2. Moderate atrophy with chronic microvascular ischemic disease. CT CERVICAL SPINE: 1. No acute traumatic injury within the cervical spine. 2. Moderate multilevel degenerative spondylolysis, greatest at C5-6. Electronically Signed   By: Jeannine Boga M.D.   On: 06/13/2016 19:08   Ct Cervical Spine Wo Contrast  Result Date: 06/13/2016 CLINICAL DATA:  Initial evaluation for acute trauma, fall. EXAM: CT HEAD WITHOUT CONTRAST CT CERVICAL SPINE WITHOUT CONTRAST TECHNIQUE: Multidetector CT imaging of the head and cervical spine was performed following the standard protocol without intravenous contrast. Multiplanar CT image reconstructions of the cervical spine were also generated. COMPARISON:  Prior CT from 12/04/2013. FINDINGS: CT HEAD FINDINGS Brain: Generalized cerebral atrophy with moderate chronic microvascular ischemic disease. No acute intracranial hemorrhage. No evidence for acute large vessel territory infarct. No mass lesion, midline shift, or mass effect. Ventricular prominence related global parenchymal volume loss of hydrocephalus. No extra-axial fluid collection. Vascular: No hyperdense vessel. Prominent vascular calcifications present within the carotid siphons and distal vertebral arteries. Skull: Scalp soft tissues demonstrate no acute abnormality. Calvarium intact. Sinuses/Orbits: Globes and orbital soft tissues within normal limits. Patient is status post lens extraction bilaterally. Mild mucosal thickening within the right sphenoid sinus. Paranasal sinuses are otherwise clear. No mastoid effusion. CT CERVICAL SPINE FINDINGS Alignment: Grade 1 anterolisthesis of C3 on C4 and C4 on C5, likely related to  chronic facet degeneration. Vertebral bodies otherwise normally aligned. Skull base and vertebrae: Skullbase intact. Skullbase intact. Normal C1-2 articulations preserved. Dens is intact. Height loss at the inferior endplate of T1 favored to be chronic in nature. Vertebral body heights otherwise maintained. No acute fracture. Soft tissues and spinal canal: Soft tissues of the neck demonstrate no acute abnormality. Prominent vascular calcifications noted about the carotid bifurcations. No prevertebral edema. Disc levels: Moderate multilevel degenerative spondylolysis, greatest at C5-6. Multilevel facet arthrosis noted. Upper chest: Visualized upper mediastinum demonstrates no acute abnormality. Visualized lungs are clear. The IMPRESSION: CT BRAIN: 1. No acute intracranial process identified. 2. Moderate atrophy with chronic microvascular ischemic disease. CT CERVICAL SPINE: 1. No acute traumatic injury within the cervical spine. 2. Moderate multilevel degenerative spondylolysis, greatest at C5-6. Electronically Signed   By: Jeannine Boga M.D.   On: 06/13/2016 19:08   Dg Knee Complete 4 Views Left  Result Date: 06/13/2016 CLINICAL DATA:  Fall, deformed left knee. EXAM: LEFT KNEE - COMPLETE 4+ VIEW COMPARISON:  None. FINDINGS: Displaced/comminuted fractures of the proximal left tibia, with probable extension to the midline/medial tibial plateau, and at least some component of impaction within the proximal metaphysis. Additional slightly displaced fracture of the proximal left fibula. Distal femur appears intact and normally aligned. Joint effusion with fat fluid level in the suprapatellar bursa. Prominent atherosclerotic calcifications within the popliteal fossa. IMPRESSION: 1. Displaced/comminuted fractures of the proximal left tibia, with probable extension to the midline/medial tibial plateau, and impaction  within the proximal metaphysis. 2. Slightly displaced fracture of the proximal left fibula. 3. Joint  effusion. 4. Atherosclerosis. Electronically Signed   By: Franki Cabot M.D.   On: 06/13/2016 19:28   Dg Hip Unilat W Or Wo Pelvis 2-3 Views Left  Result Date: 06/13/2016 CLINICAL DATA:  Status post fall, with left hip pain. Initial encounter. EXAM: DG HIP (WITH OR WITHOUT PELVIS) 2-3V LEFT COMPARISON:  None. FINDINGS: There are mildly displaced fractures of the left superior and inferior pubic rami. The left femoral head remains seated at the acetabulum. The left femoral neck appears grossly intact. The right hip joint is grossly unremarkable, with pins across the proximal right femur. Degenerative change and changes of vertebroplasty are noted along the lower lumbar spine. Mild sclerosis is seen at the left sacroiliac joint. The visualized bowel gas pattern is grossly unremarkable. Scattered vascular calcifications are seen. IMPRESSION: 1. Mildly displaced fractures of the left superior and inferior pubic rami. 2. Scattered vascular calcifications seen. Electronically Signed   By: Garald Balding M.D.   On: 06/13/2016 19:27    Positive ROS: All other systems have been reviewed and were otherwise negative with the exception of those mentioned in the HPI and as above.  Physical Exam: Vitals: Refer to EMR. Constitutional:  WD, WN, NAD HEENT:  NCAT, EOMI Neuro/Psych:  Alert & oriented to person, place, and time; appropriate mood & affect Lymphatic: No generalized extremity edema or lymphadenopathy Extremities / MSK:  The extremities are normal with respect to appearance, ranges of motion, joint stability, muscle strength/tone, sensation, & perfusion except as otherwise noted:  Left upper extremity has a sugar tong splint in place.  Intact light touch sensibility in the radial, median, and ulnar nerve distributions with intact motor to the same.  Digits are not particularly swollen, digital motion mildly restricted, no tenderness about the elbow to the extent he can be palpated.  Left anterior pelvis  tender with palpation  Left lower extremity in the immobilizer.  Tender to palpation medially, over the medial proximal tibia and proximal fibula.  Stable to varus and valgus stress, intact light touch sensibility in the plantar and dorsal aspects of the foot including the first web space, DP palpable.  Assessment/Recommendations: 1.  Left comminuted intra-articular distal radius fracture, with more impaction radially than ulnarly.  This is resulted in a fairly flattened inclination, but with the lunate still relatively well in line with the shaft of the radius.  We will plan nonoperative treatment unless alignment changes significantly.  May bear weight through left upper extremity with a platform device 2.  Left superior and inferior pubic rami fractures--> nonoperative treatment (LLE TDWB due to knee fxs) 3.  Minimally displaced left medial proximal tibial plateau and fibula neck fracture-touchdown weightbearing with the immobilizer  I anticipate her mobility will be quite impaired, quite possibly making safe return to home environment difficulties are present.  Depending upon the outcome of physical therapy evaluation, she may need discharge planning for skilled nursing facility.  [facility] with me in approx 1 week for re-check primarily of L distal radius fx  Matty Vanroekel A. [address], [place] [address], [place]  [id number] Office: [phone] Mobile: [phone]  06/14/2016, 12:17 AM

## 2016-06-14 NOTE — Progress Notes (Signed)
LOS: 1 day   Subjective: Resting comfortably in bed this am. Currently NPO. Denies BM since admission. Daughters at bedside report some slight confusion due to medication. Positive headache, chest wall pain, nausea yesterday. Denies vomiting, SOB.  PMH: emphysema, granulomatous disease per daughters  Objective: Vital signs in last 24 hours: Temp:  [94 F (34.4 C)-98.5 F (36.9 C)] 98.5 F (36.9 C) (02/02 0540) Pulse Rate:  [101-114] 114 (02/02 0540) Resp:  [12-31] 16 (02/02 0540) BP: (105-155)/(70-123) 126/80 (02/02 0540) SpO2:  [92 %-100 %] 92 % (02/02 0540) Weight:  [85 lb (38.6 kg)] 85 lb (38.6 kg) (02/01 1712) Last BM Date: 06/13/16   Laboratory  CBC  Recent Labs  06/13/16 1938 06/14/16 0554  WBC 17.9* 13.1*  HGB 10.2* 9.2*  HCT 30.4* 28.2*  PLT 172 167   BMET  Recent Labs  06/13/16 1938 06/14/16 0554  NA 136 137  K 4.0 4.7  CL 102 103  CO2 24 26  GLUCOSE 126* 120*  BUN 14 19  CREATININE 0.77 0.83  CALCIUM 8.7* 8.7*     Physical Exam General appearance: alert, oriented, no distress, cachectic, difficulty hearing Resp: normal effort, clear to anterior auscultation bilaterally Chest wall: tenderness to palpation Cardio: 3/6 systolic murmur, regular rate and rhythm GI: positive bowel sounds, soft, non tender Extremities: LUE dressing clean, dry, intact. LLE brace dry, intact, mild swelling. All extremities warm, appropriate movement. Pulses: 2+ and symmetric   Assessment/Plan: Fall Left wrist fracture - splint, non op treatment currently, weight bearing with platform device per ortho, follow-up with ortho 02/09 Left superior and inferior pubic rami fractures - non op treatment, LLE TDWB due to knee per ortho  Left proximal tib-fib fracture - TDWB with immobilizer per otho   FEN - IVF, advance diet as tolerated  VTE - SCD's, Lovenox to start today PAIN - morphine prn, robaxin prn  Dispo/plan - PT/OT eval for home vs SNF, transition to PO pain  management   Filiberto Pinks, PA-Student General Trauma PA Pager: [phone]  06/14/2016

## 2016-06-14 NOTE — Consult Note (Signed)
[facility] CM Primary Care Navigator  06/14/2016  SYLVANNA BURGGRAF [birth year] [id number]  Met with patient and two daughters [name] and Arbie Cookey) at the bedside to identify possible discharge needs. Daughters report that patient had a fall (unwitnessed) at home resulting to different fractures which led to this admission and possibly surgery.  According to daughters, patient had recurrent falls at home.  Patient's daughter endorses Dr. Kelton Pillar with [facility] as the primary care provider.    Patient shared using [place] [facility] to obtain medications without any problem.   Patient's daughter [name]) manages medications for patient at home.   [name] (daughter) provides transportation to her doctors' appointments and she is the primary caregiver of patient at home.  Discharge plan is pending therapy evaluation and physician's order. Per MD note, will need determination of fitness for home environment versus skilled nursing placement. Daughters mentioned of some family dynamics (between son and daughter) that could be a vital consideration for discharge planning. Daughters also mentioned the possibility of using caregivers at home after discharge and are aware of need to pay out of the pocket for it. [facility] list of personal care services provided in case they will need it.  Patient's daughters voiced understanding to call primary care provider's office if patient returns home, for a post discharge follow-up appointment within a week or sooner if needs arise. Patient letter (with PCP's contact number) was provided as a reminder.  Both daughters denied any further needs or concerns at this time.  For additional questions please contact:  Edwena Felty A. Rigley Niess, BSN, RN-BC [facility] PRIMARY CARE Navigator Cell: [phone]

## 2016-06-15 ENCOUNTER — Inpatient Hospital Stay (HOSPITAL_COMMUNITY): Payer: Medicare Other

## 2016-06-15 MED ORDER — METHOCARBAMOL 1000 MG/10ML IJ SOLN
500.0000 mg | Freq: Three times a day (TID) | INTRAVENOUS | Status: DC
  Administered 2016-06-15 – 2016-06-18 (×9): 500 mg via INTRAVENOUS
  Filled 2016-06-15 (×11): qty 5

## 2016-06-15 MED ORDER — GABAPENTIN 300 MG PO CAPS
300.0000 mg | ORAL_CAPSULE | Freq: Every day | ORAL | Status: AC
  Administered 2016-06-15: 300 mg via ORAL
  Filled 2016-06-15: qty 1

## 2016-06-15 MED ORDER — ENOXAPARIN SODIUM 30 MG/0.3ML ~~LOC~~ SOLN
30.0000 mg | SUBCUTANEOUS | Status: DC
  Administered 2016-06-15 – 2016-06-18 (×4): 30 mg via SUBCUTANEOUS
  Filled 2016-06-15 (×4): qty 0.3

## 2016-06-15 MED ORDER — GABAPENTIN 300 MG PO CAPS
300.0000 mg | ORAL_CAPSULE | Freq: Two times a day (BID) | ORAL | Status: DC
  Administered 2016-06-16 – 2016-06-19 (×7): 300 mg via ORAL
  Filled 2016-06-15 (×7): qty 1

## 2016-06-15 MED ORDER — TRAMADOL HCL 50 MG PO TABS: 50.0000 mg | ORAL_TABLET | Freq: Four times a day (QID) | ORAL | Status: DC | PRN

## 2016-06-15 MED ORDER — TRAMADOL HCL 50 MG PO TABS
50.0000 mg | ORAL_TABLET | Freq: Two times a day (BID) | ORAL | Status: DC | PRN
  Administered 2016-06-17 – 2016-06-19 (×3): 50 mg via ORAL
  Filled 2016-06-15 (×3): qty 1

## 2016-06-15 MED ORDER — PRO-STAT SUGAR FREE PO LIQD
30.0000 mL | Freq: Two times a day (BID) | ORAL | Status: DC
  Administered 2016-06-15 – 2016-06-16 (×3): 30 mL via ORAL
  Filled 2016-06-15 (×7): qty 30

## 2016-06-15 NOTE — Progress Notes (Signed)
Subjective: She is pretty uncomfortable especially with her left knee.  This is in a splint, left arm in splint also.  Not remembering to ask for pain medicine either, so she lies there with ongoing discomfort.  Has denture issues and appears chronically malnourished.    Objective: Vital signs in last 24 hours: Temp:  [98.2 F (36.8 C)-98.9 F (37.2 C)] 98.7 F (37.1 C) (02/03 0600) Pulse Rate:  [102-114] 102 (02/03 0600) Resp:  [15-16] 16 (02/03 0600) BP: (122-151)/(63-91) 149/86 (02/03 0600) SpO2:  [93 %-94 %] 94 % (02/03 0600) Last BM Date: 06/13/16 462 PO No IV fluids listed Urine 200 recorded Afebrile, HR up in 100-114 range sats good on R/A Labs OK WBC and H/H down some   Intake/Output from previous day: 02/02 0701 - 02/03 0700 In: 462 [P.O.:462] Out: 200 [Urine:200] Intake/Output this shift: No intake/output data recorded.  General appearance: alert, cooperative, no distress and having allot of pain, especially with the left knee.  It even hurts to remove her sock on that foot. Resp: clear to auscultation bilaterally and anterior exam. Cardio: systolic mumur, tachycardic GI: tachycardic with systolic mumur. Extremities: left arm in splint and Ace bandage.  Left leg in Velcro splint device.  this is very tender to palpation and even taking her sock off.  some lower leg edema also noted. Both feet warm  Lab Results:   Recent Labs  06/13/16 1938 06/14/16 0554  WBC 17.9* 13.1*  HGB 10.2* 9.2*  HCT 30.4* 28.2*  PLT 172 167    BMET  Recent Labs  06/13/16 1938 06/14/16 0554  NA 136 137  K 4.0 4.7  CL 102 103  CO2 24 26  GLUCOSE 126* 120*  BUN 14 19  CREATININE 0.77 0.83  CALCIUM 8.7* 8.7*   PT/INR No results for input(s): LABPROT, INR in the last 72 hours.   Recent Labs Lab 06/13/16 1938  AST 37  ALT 25  ALKPHOS 75  BILITOT 0.6  PROT 6.7  ALBUMIN 3.8     Lipase  No results found for: LIPASE   Studies/Results: Dg Elbow Complete  Left  Result Date: 06/13/2016 CLINICAL DATA:  Status post fall, with left elbow pain. Initial encounter. EXAM: LEFT ELBOW - COMPLETE 3+ VIEW COMPARISON:  None. FINDINGS: There is no evidence of fracture or dislocation. The visualized joint spaces are preserved. No significant joint effusion is identified. Mild soft tissue swelling is noted overlying the olecranon. IMPRESSION: No evidence of fracture or dislocation. Electronically Signed   By: Garald Balding M.D.   On: 06/13/2016 19:28   Dg Wrist Complete Left  Result Date: 06/13/2016 CLINICAL DATA:  [place] at home around 09:00. EXAM: LEFT WRIST - COMPLETE 3+ VIEW COMPARISON:  None. FINDINGS: There is an intra-articular comminuted fracture of the distal radius with moderate impaction. There is slight irregularity of the ulnar styloid, and nondisplaced ulnar styloid fracture cannot be excluded. There is scapholunate dissociation, indeterminate chronicity. There is moderate arthritic change of the first carpometacarpal articulation. IMPRESSION: 1. Acute intra-articular fracture of the distal radius with moderate comminution and impaction. 2. Possible nondisplaced ulnar styloid fracture. 3. Scapholunate dissociation of indeterminate chronicity. Electronically Signed   By: Andreas Newport M.D.   On: 06/13/2016 19:28   Dg Tibia/fibula Left  Result Date: 06/13/2016 CLINICAL DATA:  Status post fall, with left knee deformity. Initial encounter. EXAM: LEFT TIBIA AND FIBULA - 2 VIEW COMPARISON:  None. FINDINGS: The comminuted fractures of the proximal tibia and fibula are better characterized  on concurrent knee radiographs. No additional fractures are seen. The ankle mortise is incompletely assessed, but appears grossly unremarkable. Mild diffuse soft tissue swelling is noted about the left lower leg. A large lipohemarthrosis is seen at the left knee joint. There is osteopenia of visualized osseous structures. Diffuse vascular calcifications are seen. IMPRESSION: 1.  Comminuted fractures of the proximal tibia and fibula, better characterized on concurrent knee radiographs. 2. Large lipohemarthrosis at the left knee joint. 3. Diffuse vascular calcifications seen. 4. Osteopenia of visualized osseous structures. Electronically Signed   By: Garald Balding M.D.   On: 06/13/2016 19:30   Ct Head Wo Contrast  Result Date: 06/13/2016 CLINICAL DATA:  Initial evaluation for acute trauma, fall. EXAM: CT HEAD WITHOUT CONTRAST CT CERVICAL SPINE WITHOUT CONTRAST TECHNIQUE: Multidetector CT imaging of the head and cervical spine was performed following the standard protocol without intravenous contrast. Multiplanar CT image reconstructions of the cervical spine were also generated. COMPARISON:  Prior CT from 12/04/2013. FINDINGS: CT HEAD FINDINGS Brain: Generalized cerebral atrophy with moderate chronic microvascular ischemic disease. No acute intracranial hemorrhage. No evidence for acute large vessel territory infarct. No mass lesion, midline shift, or mass effect. Ventricular prominence related global parenchymal volume loss of hydrocephalus. No extra-axial fluid collection. Vascular: No hyperdense vessel. Prominent vascular calcifications present within the carotid siphons and distal vertebral arteries. Skull: Scalp soft tissues demonstrate no acute abnormality. Calvarium intact. Sinuses/Orbits: Globes and orbital soft tissues within normal limits. Patient is status post lens extraction bilaterally. Mild mucosal thickening within the right sphenoid sinus. Paranasal sinuses are otherwise clear. No mastoid effusion. CT CERVICAL SPINE FINDINGS Alignment: Grade 1 anterolisthesis of C3 on C4 and C4 on C5, likely related to chronic facet degeneration. Vertebral bodies otherwise normally aligned. Skull base and vertebrae: Skullbase intact. Skullbase intact. Normal C1-2 articulations preserved. Dens is intact. Height loss at the inferior endplate of T1 favored to be chronic in nature. Vertebral  body heights otherwise maintained. No acute fracture. Soft tissues and spinal canal: Soft tissues of the neck demonstrate no acute abnormality. Prominent vascular calcifications noted about the carotid bifurcations. No prevertebral edema. Disc levels: Moderate multilevel degenerative spondylolysis, greatest at C5-6. Multilevel facet arthrosis noted. Upper chest: Visualized upper mediastinum demonstrates no acute abnormality. Visualized lungs are clear. The IMPRESSION: CT BRAIN: 1. No acute intracranial process identified. 2. Moderate atrophy with chronic microvascular ischemic disease. CT CERVICAL SPINE: 1. No acute traumatic injury within the cervical spine. 2. Moderate multilevel degenerative spondylolysis, greatest at C5-6. Electronically Signed   By: Jeannine Boga M.D.   On: 06/13/2016 19:08   Ct Cervical Spine Wo Contrast  Result Date: 06/13/2016 CLINICAL DATA:  Initial evaluation for acute trauma, fall. EXAM: CT HEAD WITHOUT CONTRAST CT CERVICAL SPINE WITHOUT CONTRAST TECHNIQUE: Multidetector CT imaging of the head and cervical spine was performed following the standard protocol without intravenous contrast. Multiplanar CT image reconstructions of the cervical spine were also generated. COMPARISON:  Prior CT from 12/04/2013. FINDINGS: CT HEAD FINDINGS Brain: Generalized cerebral atrophy with moderate chronic microvascular ischemic disease. No acute intracranial hemorrhage. No evidence for acute large vessel territory infarct. No mass lesion, midline shift, or mass effect. Ventricular prominence related global parenchymal volume loss of hydrocephalus. No extra-axial fluid collection. Vascular: No hyperdense vessel. Prominent vascular calcifications present within the carotid siphons and distal vertebral arteries. Skull: Scalp soft tissues demonstrate no acute abnormality. Calvarium intact. Sinuses/Orbits: Globes and orbital soft tissues within normal limits. Patient is status post lens extraction  bilaterally. Mild mucosal thickening  within the right sphenoid sinus. Paranasal sinuses are otherwise clear. No mastoid effusion. CT CERVICAL SPINE FINDINGS Alignment: Grade 1 anterolisthesis of C3 on C4 and C4 on C5, likely related to chronic facet degeneration. Vertebral bodies otherwise normally aligned. Skull base and vertebrae: Skullbase intact. Skullbase intact. Normal C1-2 articulations preserved. Dens is intact. Height loss at the inferior endplate of T1 favored to be chronic in nature. Vertebral body heights otherwise maintained. No acute fracture. Soft tissues and spinal canal: Soft tissues of the neck demonstrate no acute abnormality. Prominent vascular calcifications noted about the carotid bifurcations. No prevertebral edema. Disc levels: Moderate multilevel degenerative spondylolysis, greatest at C5-6. Multilevel facet arthrosis noted. Upper chest: Visualized upper mediastinum demonstrates no acute abnormality. Visualized lungs are clear. The IMPRESSION: CT BRAIN: 1. No acute intracranial process identified. 2. Moderate atrophy with chronic microvascular ischemic disease. CT CERVICAL SPINE: 1. No acute traumatic injury within the cervical spine. 2. Moderate multilevel degenerative spondylolysis, greatest at C5-6. Electronically Signed   By: Jeannine Boga M.D.   On: 06/13/2016 19:08   Ct Knee Left Wo Contrast  Result Date: 06/14/2016 CLINICAL DATA:  Tibial plateau fracture, pain and swelling. EXAM: CT OF THE LEFT KNEE WITHOUT CONTRAST TECHNIQUE: Multidetector CT imaging of the LEFT knee was performed according to the standard protocol. Multiplanar CT image reconstructions were also generated. COMPARISON:  Radiographs earlier this day FINDINGS: Bones/Joint/Cartilage Tibial plateau fracture involving the tibial spines, central medial and lateral tibial plateaus. There is minimal, less than 2 mm articular surface depression. Comminuted mildly displaced metaphyseal component. Large  lipohemarthrosis, tracking into a moderate-sized Baker cyst. Minimally displaced mildly comminuted proximal fibular head/neck fracture. Diffuse osseous under mineralization. No fracture of the distal femur or patella. Fat fluid level noted in Hoffa's fat pad. Ligaments Suboptimally assessed by CT. Cruciate and collateral ligamentous fibers are visualized. Muscles and Tendons Quadriceps and patellar tendons are intact. Soft tissues Diffuse soft tissue edema.  Vascular calcifications. IMPRESSION: 1. Comminuted tibial plateau fracture with a transverse and metaphyseal component, consistent with Schatzker type 6. Minimal articular surface depression of less than 2 mm. 2. Mildly comminuted minimally displaced proximal fibular head/neck fracture. 3. Large lipohemarthrosis which tracks into a Baker cyst. Electronically Signed   By: Jeb Levering M.D.   On: 06/14/2016 00:16   Dg Knee Complete 4 Views Left  Result Date: 06/13/2016 CLINICAL DATA:  Fall, deformed left knee. EXAM: LEFT KNEE - COMPLETE 4+ VIEW COMPARISON:  None. FINDINGS: Displaced/comminuted fractures of the proximal left tibia, with probable extension to the midline/medial tibial plateau, and at least some component of impaction within the proximal metaphysis. Additional slightly displaced fracture of the proximal left fibula. Distal femur appears intact and normally aligned. Joint effusion with fat fluid level in the suprapatellar bursa. Prominent atherosclerotic calcifications within the popliteal fossa. IMPRESSION: 1. Displaced/comminuted fractures of the proximal left tibia, with probable extension to the midline/medial tibial plateau, and impaction within the proximal metaphysis. 2. Slightly displaced fracture of the proximal left fibula. 3. Joint effusion. 4. Atherosclerosis. Electronically Signed   By: Franki Cabot M.D.   On: 06/13/2016 19:28   Dg Hip Unilat W Or Wo Pelvis 2-3 Views Left  Result Date: 06/13/2016 CLINICAL DATA:  Status post  fall, with left hip pain. Initial encounter. EXAM: DG HIP (WITH OR WITHOUT PELVIS) 2-3V LEFT COMPARISON:  None. FINDINGS: There are mildly displaced fractures of the left superior and inferior pubic rami. The left femoral head remains seated at the acetabulum. The left femoral neck appears  grossly intact. The right hip joint is grossly unremarkable, with pins across the proximal right femur. Degenerative change and changes of vertebroplasty are noted along the lower lumbar spine. Mild sclerosis is seen at the left sacroiliac joint. The visualized bowel gas pattern is grossly unremarkable. Scattered vascular calcifications are seen. IMPRESSION: 1. Mildly displaced fractures of the left superior and inferior pubic rami. 2. Scattered vascular calcifications seen. Electronically Signed   By: Garald Balding M.D.   On: 06/13/2016 19:27   Prior to Admission medications   Medication Sig Start Date End Date Taking? Authorizing Provider  diazepam (VALIUM) 5 MG tablet Take 0.5 mg by mouth every 6 (six) hours as needed for muscle spasms.   Yes Historical Provider, MD  DiphenhydrAMINE HCl, Sleep, (UNISOM SLEEPGELS) 50 MG CAPS Take 50 mg by mouth at bedtime.   Yes Historical Provider, MD  Multiple Vitamins-Minerals (ICAPS AREDS 2 PO) Take 1 capsule by mouth 3 (three) times a week.   Yes Historical Provider, MD  diclofenac sodium (VOLTAREN) 1 % GEL Apply 2 g topically 3 (three) times daily. Patient not taking: Reported on 06/13/2016 12/21/13   Lavon Paganini Angiulli, PA-C  polyethylene glycol (MIRALAX / GLYCOLAX) packet Take 17 g by mouth daily. Patient not taking: Reported on 06/13/2016 12/09/13   Reyne Dumas, MD     Medications: . enoxaparin (LOVENOX) injection  40 mg Subcutaneous Q24H   . 0.9 % NaCl with KCl 20 mEq / L 50 mL/hr at 06/13/16 2343   Hx of macular degeneration Hx of Dementia Hx of DVT Hx of GERD Back surgery 04/11/16  Assessment/Plan Fall Left wrist fracture - splint, non op treatment currently,  weight bearing with platform device per ortho, follow-up with ortho 02/09 Left superior and inferior pubic rami fractures - non op treatment, LLE TDWB due to knee per ortho  Left proximal tib-fib fracture - TDWB with immobilizer per otho   FEN - IVF, advance diet as tolerated  ID:  No antibiotics  VTE - SCD's, Lovenox to start today PAIN - morphine prn, robaxin IV, increase tramadol, and add Neurontin  Dispo/plan - PT/OT eval for home vs SNF, transition to PO pain management I have called ortho to look at her left leg again and be sure this is just pain control issue.  Ask nutrition to see and help with diet.  Case manager to see on Monday.  Her daughter says she does not think she can care for her in this condition.      LOS: 2 days    Haruki Arnold 06/15/2016 [phone]

## 2016-06-15 NOTE — Progress Notes (Signed)
Initial Nutrition Assessment  DOCUMENTATION CODES:   Severe malnutrition in context of chronic illness, Underweight  INTERVENTION:  Provide Magic cup BID between meals, each supplement provides 290 kcal and 9 grams of protein.  Provide 30 ml Prostat po BID, each supplement provides 100 kcal and 15 grams of protein.   Encouraged family to bring in nutritional supplements from home that pt prefers.   NUTRITION DIAGNOSIS:   Malnutrition (severe) related to chronic illness as evidenced by severe depletion of body fat, severe depletion of muscle mass.  GOAL:   Patient will meet greater than or equal to 90% of their needs  MONITOR:   PO intake, Supplement acceptance, Labs, Weight trends, Skin, I & O's  REASON FOR ASSESSMENT:   Consult Assessment of nutrition requirement/status  ASSESSMENT:   Pt is an 83 y/o female who fell feeding cats and was found to have a left wrist fracture, left pubic rami fractures, and the left proximal tib-fib fracture. Pt has a past medical history that includes Arthritis; Cancer; Compression fracture of L1 lumbar vertebra; Dementia; DVT; Elbow fracture (12/04/2013); Macular degeneration, and HOH. Plan for no op treatment.  Meal completion has been varied from 10-80%. Family at bedside. Pt reports appetite is fine currently and PTA with usual consumption of at least 2 meals daily with a protein shake on occasion. Pt with no weight loss per weight records. Pt was offered Ensure and Boost to aid in caloric and protein needs however pt refused reports she dislikes the taste of them. Family reports pt only prefers the slim fast brand coffee flavor protein shake. Family encouraged to bring in those protein shakes for pt to consume. RD to additionally order Magic cup and prostat to aid in adequate nutrition. Questions regarding pt's current diet were addressed.   Nutrition-Focused physical exam completed. Findings are severe fat depletion, severe muscle depletion,  and mild edema.   Labs and medications reviewed.   Diet Order:  DIET SOFT Room service appropriate? Yes; Fluid consistency: Thin  Skin:  Reviewed, no issues  Last BM:  2/1  Height:   Ht Readings from Last 1 Encounters:  06/13/16 5' (1.524 m)    Weight:   Wt Readings from Last 1 Encounters:  06/13/16 85 lb (38.6 kg)    Ideal Body Weight:  45.45 kg  BMI:  Body mass index is 16.6 kg/m.  Estimated Nutritional Needs:   Kcal:  1250-1450  Protein:  55-65 grams  Fluid:  >/= 1.5 L/day  EDUCATION NEEDS:   Education needs addressed  Corrin Parker, MS, RD, LDN Pager # [phone] After hours/ weekend pager # [phone]

## 2016-06-16 LAB — CBC
HCT: 25.5 % — ABNORMAL LOW (ref 36.0–46.0)
HEMOGLOBIN: 8.5 g/dL — AB (ref 12.0–15.0)
MCH: 29.8 pg (ref 26.0–34.0)
MCHC: 33.3 g/dL (ref 30.0–36.0)
MCV: 89.5 fL (ref 78.0–100.0)
PLATELETS: 147 10*3/uL — AB (ref 150–400)
RBC: 2.85 MIL/uL — AB (ref 3.87–5.11)
RDW: 13.5 % (ref 11.5–15.5)
WBC: 11.6 10*3/uL — AB (ref 4.0–10.5)

## 2016-06-16 LAB — BASIC METABOLIC PANEL
Anion gap: 7 (ref 5–15)
BUN: 12 mg/dL (ref 6–20)
CHLORIDE: 99 mmol/L — AB (ref 101–111)
CO2: 25 mmol/L (ref 22–32)
Calcium: 8.4 mg/dL — ABNORMAL LOW (ref 8.9–10.3)
Creatinine, Ser: 0.61 mg/dL (ref 0.44–1.00)
GFR calc Af Amer: >60 mL/min (ref >60)
GLUCOSE: 115 mg/dL — AB (ref 65–99)
Potassium: 4 mmol/L (ref 3.5–5.1)
Sodium: 131 mmol/L — ABNORMAL LOW (ref 135–145)

## 2016-06-16 MED ORDER — HYDRALAZINE HCL 20 MG/ML IJ SOLN: 5.0000 mg | Freq: Four times a day (QID) | INTRAMUSCULAR | Status: DC | PRN

## 2016-06-16 MED ORDER — SENNOSIDES-DOCUSATE SODIUM 8.6-50 MG PO TABS
1.0000 | ORAL_TABLET | Freq: Two times a day (BID) | ORAL | Status: DC
  Administered 2016-06-16 – 2016-06-19 (×7): 1 via ORAL
  Filled 2016-06-16 (×7): qty 1

## 2016-06-16 MED ORDER — ACETAMINOPHEN 325 MG PO TABS
650.0000 mg | ORAL_TABLET | Freq: Four times a day (QID) | ORAL | Status: DC
  Administered 2016-06-16 – 2016-06-19 (×8): 650 mg via ORAL
  Filled 2016-06-16 (×9): qty 2

## 2016-06-16 NOTE — Progress Notes (Signed)
Patient ID: Vanessa Pace, female   DOB: Nov 06, 1926, 83 y.o.   MRN: [id number]  [facility] Surgery Progress Note     Subjective: Slept well last night. Continues to have pain control issues, especially with LLE. Worked with PT yesterday, recommending SNF. Appetite ok. States that she is passing gas but has not had BM yet.  Objective: Vital signs in last 24 hours: Temp:  [98 F (36.7 C)-98.4 F (36.9 C)] 98.1 F (36.7 C) (02/04 0425) Pulse Rate:  [99-101] 99 (02/04 0425) Resp:  [16-18] 18 (02/04 0425) BP: (152-161)/(86-92) 156/89 (02/04 0425) SpO2:  [94 %] 94 % (02/04 0425) Last BM Date: 06/13/16  Intake/Output from previous day: 02/03 0701 - 02/04 0700 In: 480 [P.O.:480] Out: -  Intake/Output this shift: No intake/output data recorded.  PE: General appearance: alert, cooperative, NAD Resp: clear to auscultation bilaterally, effort normal Cardio: systolic mumur, tachycardic GI: soft, ND, NT, +BS, no HSM Extremities: left arm in splint and Ace bandage, swelling noted in fingers but are WWP with good cap refill.  Left leg in KI.  BLE edema noted, no calf tenderness. Both feet warm  Lab Results:   Recent Labs  06/14/16 0554 06/16/16 0337  WBC 13.1* 11.6*  HGB 9.2* 8.5*  HCT 28.2* 25.5*  PLT 167 147*   BMET  Recent Labs  06/14/16 0554 06/16/16 0337  NA 137 131*  K 4.7 4.0  CL 103 99*  CO2 26 25  GLUCOSE 120* 115*  BUN 19 12  CREATININE 0.83 0.61  CALCIUM 8.7* 8.4*   PT/INR No results for input(s): LABPROT, INR in the last 72 hours. CMP     Component Value Date/Time   NA 131 (L) 06/16/2016 0337   K 4.0 06/16/2016 0337   CL 99 (L) 06/16/2016 0337   CO2 25 06/16/2016 0337   GLUCOSE 115 (H) 06/16/2016 0337   BUN 12 06/16/2016 0337   CREATININE 0.61 06/16/2016 0337   CALCIUM 8.4 (L) 06/16/2016 0337   PROT 6.7 06/13/2016 1938   ALBUMIN 3.8 06/13/2016 1938   AST 37 06/13/2016 1938   ALT 25 06/13/2016 1938   ALKPHOS 75 06/13/2016 1938   BILITOT  0.6 06/13/2016 1938   GFRNONAA >60 06/16/2016 0337   GFRAA >60 06/16/2016 0337   Lipase  No results found for: LIPASE     Studies/Results: Dg Foot 2 Views Left  Result Date: 06/15/2016 CLINICAL DATA:  83 year old female with generalized left foot pain. Bruising. Fall several days ago. Initial encounter. EXAM: LEFT FOOT - 2 VIEW COMPARISON:  None. FINDINGS: Osteopenia. Chronic appearing deformity at the head of the second metatarsal. No acute metatarsal or phalanx fracture identified. The lateral view is oblique. Tarsal bone alignment appears to remain normal. The calcaneus appears intact with degenerative spurring. IMPRESSION: No acute fracture or dislocation identified about the left foot. Electronically Signed   By: Genevie Ann M.D.   On: 06/15/2016 13:49    Anti-infectives: Anti-infectives    None       Assessment/Plan Fall Left wrist fracture - splint, non op treatment currently, weight bearing with platform device per ortho, follow-up with ortho 02/09 Left superior and inferior pubic rami fractures- non op treatment, LLE TDWB due to knee per ortho  Left proximal tib-fib fracture- TDWB with immobilizer per otho, pending Dr. Stann Mainland to consult Anemia - Hg 8.5, stable, monitor HTN - hydralazine PRN, hopefully will improve once pain improves  FEN - IVF, soft diet advance diet as tolerated, Pro-Stat ID:  No  antibiotics  VTE - SCD's, Lovenox PAIN - morphine prn, robaxin IV, tramadol, Neurontin, add scheduled tylenol, add colace BID  Dispo/plan- Continue working with PT/OT, home vs SNF. Encourage IS. Pending official ortho consult for left prox tib-fib fracture (contacted yesterday). Continue working on pain control. Case manager to see on Monday.     LOS: 3 days    Jerrye Beavers , [facility] 06/16/2016, 7:49 AM Pager: [phone] Consults: [phone] Mon-Fri 7:00 am-4:30 pm Sat-Sun 7:00 am-11:30 am

## 2016-06-17 LAB — BASIC METABOLIC PANEL
Anion gap: 5 (ref 5–15)
BUN: 16 mg/dL (ref 6–20)
CO2: 23 mmol/L (ref 22–32)
CREATININE: 0.59 mg/dL (ref 0.44–1.00)
Calcium: 8.4 mg/dL — ABNORMAL LOW (ref 8.9–10.3)
Chloride: 106 mmol/L (ref 101–111)
GFR calc Af Amer: >60 mL/min (ref >60)
GLUCOSE: 103 mg/dL — AB (ref 65–99)
Potassium: 4 mmol/L (ref 3.5–5.1)
Sodium: 134 mmol/L — ABNORMAL LOW (ref 135–145)

## 2016-06-17 LAB — CBC
HCT: 26.1 % — ABNORMAL LOW (ref 36.0–46.0)
Hemoglobin: 8.8 g/dL — ABNORMAL LOW (ref 12.0–15.0)
MCH: 29.9 pg (ref 26.0–34.0)
MCHC: 33.7 g/dL (ref 30.0–36.0)
MCV: 88.8 fL (ref 78.0–100.0)
PLATELETS: 168 10*3/uL (ref 150–400)
RBC: 2.94 MIL/uL — ABNORMAL LOW (ref 3.87–5.11)
RDW: 13.6 % (ref 11.5–15.5)
WBC: 9.2 10*3/uL (ref 4.0–10.5)

## 2016-06-17 MED ORDER — TAMSULOSIN HCL 0.4 MG PO CAPS
0.4000 mg | ORAL_CAPSULE | Freq: Every day | ORAL | Status: DC
  Administered 2016-06-17 – 2016-06-19 (×3): 0.4 mg via ORAL
  Filled 2016-06-17 (×3): qty 1

## 2016-06-17 MED ORDER — BETHANECHOL CHLORIDE 25 MG PO TABS
25.0000 mg | ORAL_TABLET | Freq: Three times a day (TID) | ORAL | Status: DC
  Administered 2016-06-17 – 2016-06-19 (×7): 25 mg via ORAL
  Filled 2016-06-17 (×7): qty 1

## 2016-06-17 MED ORDER — DOCUSATE SODIUM 100 MG PO CAPS
100.0000 mg | ORAL_CAPSULE | Freq: Two times a day (BID) | ORAL | Status: DC
  Administered 2016-06-17 – 2016-06-19 (×4): 100 mg via ORAL
  Filled 2016-06-17 (×4): qty 1

## 2016-06-17 NOTE — Progress Notes (Signed)
LOS: 4 days   Subjective: Patient reports pain control is improved. She has not mobilized over the weekend. She is tolerating her soft diet but does experience some nausea. She denies any BM since admission, but admits having flatus.   Denies fever, chills, headache, chest pain, palpitations, SOB, abdominal pain, vomiting.   Objective: Vital signs in last 24 hours: Temp:  [98.3 F (36.8 C)-98.7 F (37.1 C)] 98.6 F (37 C) (02/05 0545) Pulse Rate:  [97-101] 97 (02/05 0545) Resp:  [18] 18 (02/05 0545) BP: (125-139)/(76-82) 139/82 (02/05 0545) SpO2:  [95 %-100 %] 100 % (02/05 0545) Last BM Date: 06/13/16   Laboratory  CBC  Recent Labs  06/16/16 0337 06/17/16 0501  WBC 11.6* 9.2  HGB 8.5* 8.8*  HCT 25.5* 26.1*  PLT 147* 168   BMET  Recent Labs  06/16/16 0337 06/17/16 0501  NA 131* 134*  K 4.0 4.0  CL 99* 106  CO2 25 23  GLUCOSE 115* 103*  BUN 12 16  CREATININE 0.61 0.59  CALCIUM 8.4* 8.4*     Physical Exam General appearance: alert, oriented, no distress, cachectic Resp: normal effort, clear to auscultation bilaterally Cardio: 3/6 systolic murmur, regular rate and rhythm GI: positive bowel sounds, soft, non tender Extremities: LUE dressing clean, dry, intact, moderate hand swelling. LLE brace dry, intact, moderate swelling. All extremities warm, no cyanosis. Pulses: 2+ and symmetric   Assessment/Plan: Fall Left wrist fracture - splint, non op treatment currently, weight bearing with platform device per ortho, follow-up with ortho 02/09 Left superior and inferior pubic rami fractures- non op treatment, LLE TDWB due to knee per ortho  Left proximal tib-fib fracture- TDWB with immobilizer per otho, pending Dr. Stann Mainland to consult Anemia - Hg 8.8, equilibrating HTN - hydralazine PRN, hopefully will improve once pain improves  FEN - IVF, soft diet advance diet as tolerated, Pro-Stat ID: No antibiotics  VTE - SCD's, Lovenox PAIN - morphine prn, robaxin  IV, tramadol, Neurontin, scheduled tylenol   Dispo/plan- Progress with PT/OT, therapies recommend SNF. Encourage IS. Pending official ortho consult for left prox tib-fib fracture. Case manager to discuss d/c recommendations with family.   Filiberto Pinks, PA-[facility] PA Pager: [phone]  06/17/2016

## 2016-06-17 NOTE — Progress Notes (Signed)
Pt complains of not being able to urinate. Pt bladder scanned, showing 600 ml. Order given to place a foley.

## 2016-06-17 NOTE — Care Management Important Message (Signed)
Important Message  Patient Details  Name: Vanessa Pace MRN: [id number] Date of Birth: [birth year]   Medicare Important Message Given:  Yes    [name] [address] 06/17/2016, 4:42 PM

## 2016-06-17 NOTE — Clinical Social Work Placement (Signed)
   CLINICAL SOCIAL WORK PLACEMENT  NOTE  Date:  06/17/2016  Patient Details  Name: Vanessa Pace MRN: [id number] Date of Birth: [birth year]  Clinical Social Work is seeking post-discharge placement for this patient at the [place] level of care (*CSW will initial, date and re-position this form in  chart as items are completed):  Yes   Patient/family provided with [place] Work Department's list of facilities offering this level of care within the geographic area requested by the patient (or if unable, by the patient's family).  Yes   Patient/family informed of their freedom to choose among providers that offer the needed level of care, that participate in Medicare, Medicaid or managed care program needed by the patient, have an available bed and are willing to accept the patient.  Yes   Patient/family informed of [place]'s ownership interest in [facility] and [facility], as well as of the fact that they are under no obligation to receive care at these facilities.  PASRR submitted to EDS on       PASRR number received on       Existing PASRR number confirmed on 06/17/16     FL2 transmitted to all facilities in geographic area requested by pt/family on 06/17/16     FL2 transmitted to all facilities within larger geographic area on       Patient informed that his/her managed care company has contracts with or will negotiate with certain facilities, including the following:            Patient/family informed of bed offers received.  Patient chooses bed at       Physician recommends and patient chooses bed at      Patient to be transferred to   on  .  Patient to be transferred to facility by       Patient family notified on   of transfer.  Name of family member notified:        PHYSICIAN Please sign FL2, Please prepare priority discharge summary, including medications     Additional Comment:   Barbette Or,  [place]

## 2016-06-17 NOTE — Clinical Social Work Note (Signed)
Clinical Social Work Assessment  Patient Details  Name: Vanessa Pace MRN: [id number] Date of Birth: [birth year]  Date of referral:  06/17/16               Reason for consult:  Facility Placement, Trauma                Permission sought to share information with:  Family Supports Permission granted to share information::  Yes, Verbal Permission Granted  Name::     Corri Delapaz  Relationship::  Daughter  Contact Information:  [phone]  Housing/Transportation Living arrangements for the past 2 months:  [place] of Information:  Patient, Adult Children Patient Interpreter Needed:  None Criminal Activity/Legal Involvement Pertinent to Current Situation/Hospitalization:  No - Comment as needed Significant Relationships:  Adult Children Lives with:  Adult Children Do you feel safe going back to the place where you live?  Yes Need for family participation in patient care:  Yes (Comment)  Care giving concerns:  Patient family received a skilled nursing facility list from [place] provider, and have chosen preference facilities based on the star rating from this list.  Patient daughter states that patient children all live with patient but do not get along.   Social Worker assessment / plan:  Holiday representative met with patient daughter, [name], at the bedside to offer support and discuss patient needs at discharge.  Patient daughter states that patient lives in her own home and all three of her children are currently living with her.  Patient daughter, [name], is the primary caregiver as the other two children work.  Patient and patient daughter are agreeable to SNF placement in [place] area.  CSW to initiate referral and follow up with patient and family regarding available bed offers.  CSW remains available for support and to facilitate patient discharge needs once medically stable.  Employment status:  Retired Forensic scientist:  Medicare PT  Recommendations:  [place] / Referral to community resources:  [place]  Patient/Family's Response to care:  Patient and family verbalize understanding of CSW role and appreciation for support and concern.  Patient and family agreeable with placement with plans to transition home following rehab stay.  Patient/Family's Understanding of and Emotional Response to Diagnosis, Current Treatment, and Prognosis:  Patient and family with some social issues and turmoil amongst the family.  Patient aware of the issues and plans to continue to return home following stay.  Patient and family aware of patient limitations and need for short term placement.  Emotional Assessment Appearance:  Appears stated age Attitude/Demeanor/Rapport:  Inconsistent Affect (typically observed):  Unable to Assess Orientation:  Oriented to Self, Oriented to Place, Oriented to Situation Alcohol / Substance use:  Not Applicable Psych involvement (Current and /or in the community):  No (Comment)  Discharge Needs  Concerns to be addressed:  Discharge Planning Concerns Readmission within the last 30 days:  No Current discharge risk:  Dependent with Mobility Barriers to Discharge:  Continued Medical Work up  [employer], [place]

## 2016-06-17 NOTE — NC FL2 (Signed)
[place] LEVEL OF CARE SCREENING TOOL     IDENTIFICATION  Patient Name: Vanessa Pace Birthdate: March 28, 1927 Sex: female Admission Date (Current Location): 06/13/2016  [facility] and [place] Number:  Herbalist and Address:  [facility] [place]. [facility], [place] [address], [place], [place] [address]      Provider Number: [id number]  Attending Physician Name and Address:  Trauma Md, MD  Relative Name and Phone Number:       Current Level of Care: Hospital Recommended Level of Care: [place] Prior Approval Number:    Date Approved/Denied:   PASRR Number: [id number]  Discharge Plan: SNF    Current Diagnoses: Patient Active Problem List   Diagnosis Date Noted  . Pelvic fracture ([place]) 06/13/2016  . Fall 12/09/2013  . Fracture of olecranon process of right ulna 12/08/2013  . Hip fracture ([place]) 12/04/2013  . Closed fracture of right ulna 12/04/2013  . Minor head injury without loss of consciousness 12/04/2013  . Leucocytosis 12/04/2013    Orientation RESPIRATION BLADDER Height & Weight     Self, Time, Situation, Place  Normal Incontinent Weight: 85 lb (38.6 kg) Height:  5' (152.4 cm)  BEHAVIORAL SYMPTOMS/MOOD NEUROLOGICAL BOWEL NUTRITION STATUS      Continent Diet (Soft Diet with Thin Liquids)  AMBULATORY STATUS COMMUNICATION OF NEEDS Skin   Extensive Assist Verbally Normal                       Personal Care Assistance Level of Assistance  Bathing, Feeding, Dressing Bathing Assistance: Limited assistance Feeding assistance: Limited assistance Dressing Assistance: Limited assistance     Functional Limitations Info  Sight, Hearing, Speech Sight Info: Adequate Hearing Info: Adequate Speech Info: Adequate    SPECIAL CARE FACTORS FREQUENCY  PT (By licensed PT), OT (By licensed OT)     PT Frequency: 3x week OT Frequency: 3x week            Contractures Contractures Info: Not present    Additional  Factors Info  Code Status, Allergies Code Status Info: Full Code Allergies Info: Adhesive Tape, Amoxicillin, Hydrocodone           Current Medications (06/17/2016):  This is the current hospital active medication list Current Facility-Administered Medications  Medication Dose Route Frequency Provider Last Rate Last Dose  . 0.9 % NaCl with KCl 20 mEq/ L  infusion   Intravenous Continuous Georganna Skeans, MD 50 mL/hr at 06/16/16 1855    . acetaminophen (TYLENOL) tablet 650 mg  650 mg Oral Q6H Brooke A Miller, PA-C   650 mg at 06/17/16 1336  . bethanechol (URECHOLINE) tablet 25 mg  25 mg Oral TID Jill Alexanders, PA-C   25 mg at 06/17/16 1019  . enoxaparin (LOVENOX) injection 30 mg  30 mg Subcutaneous Q24H Cecilio Asper Batchelder, RPH   30 mg at 06/17/16 1336  . feeding supplement (PRO-STAT SUGAR FREE 64) liquid 30 mL  30 mL Oral BID Judeth Horn, MD   30 mL at 06/16/16 2136  . gabapentin (NEURONTIN) capsule 300 mg  300 mg Oral BID Earnstine Regal, PA-C   300 mg at 06/17/16 1019  . hydrALAZINE (APRESOLINE) injection 5 mg  5 mg Intravenous Q6H PRN Jerrye Beavers, PA-C      . methocarbamol (ROBAXIN) 500 mg in dextrose 5 % 50 mL IVPB  500 mg Intravenous Q8H Earnstine Regal, PA-C   500 mg at 06/17/16 1337  . morphine 2 MG/ML  injection 2 mg  2 mg Intravenous Q1H PRN Earnstine Regal, PA-C   2 mg at 06/15/16 2238  . ondansetron (ZOFRAN) tablet 4 mg  4 mg Oral Q6H PRN Georganna Skeans, MD       Or  . ondansetron [facility]) injection 4 mg  4 mg Intravenous Q6H PRN Georganna Skeans, MD      . senna-docusate (Senokot-S) tablet 1 tablet  1 tablet Oral BID Jerrye Beavers, PA-C   1 tablet at 06/17/16 1019  . tamsulosin (FLOMAX) capsule 0.4 mg  0.4 mg Oral Daily Darci Current [place], PA-C   0.4 mg at 06/17/16 1019  . traMADol (ULTRAM) tablet 50-100 mg  50-100 mg Oral Q12H PRN Earnstine Regal, PA-C         Discharge Medications: Please see discharge summary for a list of discharge medications.  Relevant  Imaging Results:  Relevant Lab Results:   Additional Information SSN [id number]    Barbette Or, [place]

## 2016-06-18 DIAGNOSIS — E43 Unspecified severe protein-calorie malnutrition: Secondary | ICD-10-CM | POA: Insufficient documentation

## 2016-06-18 MED ORDER — METHOCARBAMOL 500 MG PO TABS
500.0000 mg | ORAL_TABLET | Freq: Three times a day (TID) | ORAL | Status: DC | PRN
  Administered 2016-06-18: 500 mg via ORAL
  Filled 2016-06-18 (×2): qty 1

## 2016-06-18 MED ORDER — PRO-STAT SUGAR FREE PO LIQD: 30.0000 mL | Freq: Two times a day (BID) | ORAL | 0 refills | Status: DC

## 2016-06-18 MED ORDER — GABAPENTIN 300 MG PO CAPS: 300.0000 mg | ORAL_CAPSULE | Freq: Two times a day (BID) | ORAL | Status: DC

## 2016-06-18 MED ORDER — TAMSULOSIN HCL 0.4 MG PO CAPS: 0.4000 mg | ORAL_CAPSULE | Freq: Every day | ORAL | Status: DC

## 2016-06-18 MED ORDER — ENOXAPARIN SODIUM 30 MG/0.3ML ~~LOC~~ SOLN: 30.0000 mg | SUBCUTANEOUS | Status: DC

## 2016-06-18 MED ORDER — HYDRALAZINE HCL 20 MG/ML IJ SOLN: 5.0000 mg | Freq: Four times a day (QID) | INTRAMUSCULAR | Status: DC | PRN

## 2016-06-18 MED ORDER — METHOCARBAMOL 500 MG PO TABS: 500.0000 mg | ORAL_TABLET | Freq: Three times a day (TID) | ORAL | Status: DC | PRN

## 2016-06-18 MED ORDER — SENNOSIDES-DOCUSATE SODIUM 8.6-50 MG PO TABS: 1.0000 | ORAL_TABLET | Freq: Two times a day (BID) | ORAL | Status: DC

## 2016-06-18 MED ORDER — ACETAMINOPHEN 325 MG PO TABS: 650.0000 mg | ORAL_TABLET | Freq: Four times a day (QID) | ORAL | Status: DC

## 2016-06-18 MED ORDER — ONDANSETRON HCL 4 MG PO TABS: 4.0000 mg | ORAL_TABLET | Freq: Four times a day (QID) | ORAL | 0 refills | Status: DC | PRN

## 2016-06-18 MED ORDER — TRAMADOL HCL 50 MG PO TABS: 50.0000 mg | ORAL_TABLET | Freq: Two times a day (BID) | ORAL | 0 refills | Status: DC | PRN

## 2016-06-18 MED ORDER — DOCUSATE SODIUM 100 MG PO CAPS: 100.0000 mg | ORAL_CAPSULE | Freq: Two times a day (BID) | ORAL | 0 refills | Status: DC

## 2016-06-18 MED ORDER — BETHANECHOL CHLORIDE 25 MG PO TABS: 25.0000 mg | ORAL_TABLET | Freq: Three times a day (TID) | ORAL | Status: DC

## 2016-06-18 NOTE — Progress Notes (Signed)
CSW following for SNF placement; likely dc 06/19/16 upon medical stability.    Reinaldo Raddle, RN, BSN  Trauma/Neuro ICU Case Manager [phone]

## 2016-06-18 NOTE — Clinical Social Work Note (Signed)
Clinical Social Worker facilitated patient discharge including contacting patient family and facility to confirm patient discharge plans.  Clinical information faxed to facility and family agreeable with plan.  CSW arranged ambulance transport via [place] to [facility].  RN to call report prior to discharge [phone] [phone]).  Clinical Social Worker will sign off for now as social work intervention is no longer needed. Please consult [place] again if new need arises.  Barbette Or, [place]

## 2016-06-18 NOTE — Progress Notes (Signed)
Occupational Therapy Treatment Patient Details Name: Vanessa Pace MRN: [id number] DOB: Feb 18, 1927 Today's Date: 06/18/2016    History of present illness Pt is an 83 y/o female who fell feeding cats and was found to have a left wrist fracture, left pubic rami fractures, and the left proximal tib-fib fracture. Pt has a past medical history that includes Arthritis; Cancer; Compression fracture of L1 lumbar vertebra; Dementia; DVT; Elbow fracture (12/04/2013); Macular degeneration, and HOH. Pt has a past surgical history that includes Fracture surgery; femur surgery; ORIF elbow fracture (Right, 12/06/2013); cataracts; Hip surgery; and Kyphoplasty (04/11/2016).   OT comments  Pt continues to require extensive assist with ADLs and  mobility, +2 with RN assisting OT. Pt limited by pain and weakness. Pt requires cues for L UE NWB during sit - stand transitions. OT will continue to follow acutely  Follow Up Recommendations  SNF;Supervision/Assistance - 24 hour    Equipment Recommendations    TBD at next venue   Recommendations for Other Services      Precautions / Restrictions Precautions Precautions: Fall Precaution Comments: Pt had a fall that resulted in current injury. Has had multiple falls in the past year Required Braces or Orthoses: Knee Immobilizer - Left Knee Immobilizer - Left: On at all times Restrictions Weight Bearing Restrictions: Yes LUE Weight Bearing: Weight bear through elbow only LLE Weight Bearing: Touchdown weight bearing Other Position/Activity Restrictions: AAROM and gentle PROM L knee       Mobility Bed Mobility         Supine to sit: Max assist;HOB elevated;+2 for physical assistance     General bed mobility comments: pt up in recliner  Transfers Overall transfer level: Needs assistance Equipment used: None Transfers: Sit to/from [facility] Sit to Stand: Max assist;+2 physical assistance Stand pivot transfers: Max assist;+2 physical  assistance       General transfer comment: Attempted sit to stand with L platform RW. Due to pain and kyphosis, pt unable to attain full upright stance for L forearm placement in platform    Balance Overall balance assessment: History of Falls;Needs assistance Sitting-balance support: Single extremity supported;Feet supported Sitting balance-Leahy Scale: Fair Sitting balance - Comments: posterior and R lean Postural control: Posterior lean Standing balance support: Bilateral upper extremity supported Standing balance-Leahy Scale: Zero Standing balance comment: relies on clinicians for support throughout transfer                   ADL Overall ADL's : Needs assistance/impaired Eating/Feeding: Set up   Grooming: Wash/dry hands;Wash/dry face;Set up;Sitting   Upper Body Bathing: Moderate assistance;Sitting       Upper Body Dressing : Maximal assistance;Sitting;Moderate assistance       Toilet Transfer: +2 for safety/equipment;Stand-pivot;BSC;Maximal assistance   Toileting- Clothing Manipulation and Hygiene: Total assistance;Sit to/from stand       Functional mobility during ADLs: Maximal assistance;Cueing for safety;+2 for physical assistance;+2 for safety/equipment                                        Cognition   Behavior During Therapy: [facility] for tasks assessed/performed Overall Cognitive Status: Within Functional Limits for tasks assessed                       Extremity/Trunk Assessment   L UE immobilized, NWB, generalized weakness  General Comments  pt pleasant and cooperative    Pertinent Vitals/ Pain       Pain Assessment: Faces Pain Score: 8  Faces Pain Scale: Hurts whole lot Pain Location: L UE, pelvis with mobility Pain Descriptors / Indicators: Grimacing;Guarding;Sore;Moaning Pain Intervention(s): Limited activity within patient's tolerance;Monitored during session;Repositioned (RN gave meds  at end of session)                                                          Frequency  Min 2X/week        Progress Toward Goals  OT Goals(current goals can now be found in the care plan section)  Progress towards OT goals: Progressing toward goals  Acute Rehab OT Goals Patient Stated Goal: to get better  Plan Discharge plan remains appropriate                     End of Session Equipment Utilized During Treatment: Gait belt;Left knee immobilizer;Other (comment) (BSC)   Activity Tolerance Patient limited by pain;Patient limited by fatigue   Patient Left in bed;with call bell/phone within reach;with family/visitor present;in chair;with chair alarm set;with nursing/sitter in room             Time: 1209-1246 OT Time Calculation (min): 37 min  Charges: OT General Charges $OT Visit: 1 Procedure OT Treatments $Self Care/Home Management : 8-22 mins $Therapeutic Activity: 8-22 mins  Britt Bottom 06/18/2016, 1:22 PM

## 2016-06-18 NOTE — Progress Notes (Signed)
Physical Therapy Treatment Patient Details Name: Vanessa Pace MRN: [id number] DOB: 04-27-1927 Today's Date: 06/18/2016    History of Present Illness Pt is an 83 y/o female who fell feeding cats and was found to have a left wrist fracture, left pubic rami fractures, and the left proximal tib-fib fracture. Pt has a past medical history that includes Arthritis; Cancer; Compression fracture of L1 lumbar vertebra; Dementia; DVT; Elbow fracture (12/04/2013); Macular degeneration, and HOH. Pt has a past surgical history that includes Fracture surgery; femur surgery; ORIF elbow fracture (Right, 12/06/2013); cataracts; Hip surgery; and Kyphoplasty (04/11/2016).    PT Comments    Pt agreeable to participate with PT. Mobility limited by pain and fatigue. Session concentrated on bed to recliner transfer. Pt encouraged to sit up in recliner daily.   Follow Up Recommendations  SNF     Equipment Recommendations  None recommended by PT    Recommendations for Other Services       Precautions / Restrictions Precautions Precautions: Fall Precaution Comments: Pt had a fall that resulted in current injury. Has had multiple falls in the past year Required Braces or Orthoses: Knee Immobilizer - Left Knee Immobilizer - Left: On at all times Restrictions LUE Weight Bearing: Weight bear through elbow only LLE Weight Bearing: Touchdown weight bearing Other Position/Activity Restrictions: AAROM and gentle PROM L knee    Mobility  Bed Mobility         Supine to sit: Max assist;HOB elevated;+2 for physical assistance     General bed mobility comments: max assist for all components of transition supine to sit EOB. Min guard assist for sitting balance.  Transfers       Sit to Stand: Max assist;+2 physical assistance Stand pivot transfers: Max assist;+2 physical assistance       General transfer comment: Attempted sit to stand with L platform RW. Due to pain and kyphosis, pt unable to attain full  upright stance for L forearm placement in platform. Pt returned to sitting EOB and SPT to R performed with A.D.  Ambulation/Gait             General Gait Details: unable due to pain.   Stairs            Wheelchair Mobility    Modified Rankin (Stroke Patients Only)       Balance     Sitting balance-Leahy Scale: Fair Sitting balance - Comments: initial posterior lean that resolved as we sat EOB Postural control: Posterior lean Standing balance support: Bilateral upper extremity supported Standing balance-Leahy Scale: Zero Standing balance comment: relies on clinicians for support throughout transfer                    Cognition Arousal/Alertness: Awake/alert Behavior During Therapy: [facility] for tasks assessed/performed Overall Cognitive Status: Within Functional Limits for tasks assessed                      Exercises      General Comments General comments (skin integrity, edema, etc.): Pt's daughter in room during session.      Pertinent Vitals/Pain Pain Assessment: Faces Faces Pain Scale: Hurts whole lot Pain Location: LUE, pelvis, and LLE (knee cap) Pain Descriptors / Indicators: Grimacing;Guarding;Sore;Sharp;Pressure;Moaning Pain Intervention(s): Limited activity within patient's tolerance;Monitored during session;Repositioned    Home Living                      Prior Function  PT Goals (current goals can now be found in the care plan section) Acute Rehab PT Goals Patient Stated Goal: to get better PT Goal Formulation: With patient/family Time For Goal Achievement: 06/21/16 Potential to Achieve Goals: Fair Progress towards PT goals: Progressing toward goals (slowly)    Frequency    Min 3X/week      PT Plan Current plan remains appropriate    Co-evaluation             End of Session Equipment Utilized During Treatment: Gait belt Activity Tolerance: Patient limited by pain;Patient limited by  fatigue Patient left: in chair;with call bell/phone within reach;with family/visitor present;with chair alarm set     Time: 1026-1050 PT Time Calculation (min) (ACUTE ONLY): 24 min  Charges:  $Therapeutic Activity: 23-37 mins                    G Codes:      Lorriane Shire 06/18/2016, 11:14 AM

## 2016-06-18 NOTE — Discharge Summary (Signed)
[facility] Discharge Summary   Patient ID: Vanessa Pace MRN: [id number] DOB/AGE: [birth year] 83 y.o.  Admit date: 06/13/2016 Discharge date: 06/18/2016  Admitting Diagnosis: Fall Pelvic fracture Left wrist fracture  Left tibia fracture Left fibula fracture  Discharge Diagnosis Patient Active Problem List   Diagnosis Date Noted  . Protein-calorie malnutrition, severe 06/18/2016  . Pelvic fracture ([place]) 06/13/2016  . Fall 12/09/2013  . Fracture of olecranon process of right ulna 12/08/2013  . Hip fracture ([place]) 12/04/2013  . Closed fracture of right ulna 12/04/2013  . Minor head injury without loss of consciousness 12/04/2013  . Leucocytosis 12/04/2013    Consultants Orthopedic Surgery - Milly Jakob, MD (06/14/16)   Imaging: 06/13/16 CT HEAD and C-SPINE W/O - No acute intracranial process. No acute injury to C-spine.  06/13/16 DG Wrist, Left - Acute intra-articular fracture of the distal radius with moderate comminution and impaction. Possible nondisplaced ulnar styloid fracture. Scapholunate dissociation of indeterminate chronicity.  06/13/16 DG elbow complete, Left - No evidence of fracture or dislocation  06/13/16 DG knee complete, Left - Displaced/comminuted fractures of the proximal left tibia, with probable extension to the midline/medial tibial plateau, and impaction within the proximal metaphysis. Slightly displaced fracture of the proximal left fibula. Joint effusion.  06/13/16 DG hip unilateral  - Mildly displaced fractures of the left superior and inferior pubic rami.  06/13/16 DG tibia/fibula, Left - Comminuted fractures of the proximal tibia and fibula, better characterized on concurrent knee radiographs. Large lipohemarthrosis at the left knee joint.  06/13/16 CT knee left W/O - Comminuted tibial plateau fracture with a transverse and metaphyseal component, consistent with Schatzker type 6. Minimal articular surface depression of less than 2 mm. Mildly  comminuted minimally displaced proximal fibular head/neck Fracture. Large lipohemarthrosis which tracks into a Baker cyst.  06/15/16 DG foot, left - No acute fracture or dislocation identified about the left foot.  Hospital Course:  83 year old female who presented to emergency department after falling down 3 concrete steps. Patient was unable to get up but denied loss of consciousness. Found down approximately 7 hours later by her daughter and was brought to ED for evaluation. She was found to have a left wrist fracture, left pubic rami fractures, and the left proximal tib-fib fracture. She was admitted for orthopedic surgery consult, pain control, and observation. Orthopedic surgery evaluated the patient and determined that all fractures could be managed non-operatively. Patient may weight weight through left upper extremity with platform device, touch down weight bear on left pubic rami fractures and tibal/fibular fractures. By hospital day #5 the patients pain was controlled, tolerating PO intake, working with therapies, and felt stable for discharge to skilled nursing facility. PT/OT did not recommend equipment at discharge.  Patient did have issues with urinary retention during her hospitalization, requiring replacement of foley catheter on 06/17/16. She was started on Urecholine and Flomax as prescribed above. Foley may be discontinued on 06/19/16 and medications stopped 06/21/16 if patient is voiding well.   Patient should follow up with Dr. Grandville Silos in 1 week and with the [facility] trauma clinic as needed.  I have personally reviewed the patients medication history on the [place] controlled substance database.   Allergies  Allergen Reactions  . Adhesive [Tape] Other (See Comments)    Tears skin  . Amoxicillin Hives and Rash    Has patient had a PCN reaction causing immediate rash, facial/tongue/throat swelling, SOB or lightheadedness with hypotension: Yes Has patient had a PCN reaction causing severe  rash involving  mucus membranes or skin necrosis: Yes Has patient had a PCN reaction that required hospitalization No Has patient had a PCN reaction occurring within the last 10 years: No If all of the above answers are "NO", then may proceed with Cephalosporin use.   [name] Kitchen Hydrocodone Nausea Only    Current Facility-Administered Medications:  .  0.9 % NaCl with KCl 20 mEq/ L  infusion, , Intravenous, Continuous, Georganna Skeans, MD, Last Rate: 50 mL/hr at 06/17/16 1625 .  acetaminophen (TYLENOL) tablet 650 mg, 650 mg, Oral, Q6H, Brooke A Miller, PA-C, 650 mg at 06/18/16 1236 .  bethanechol (URECHOLINE) tablet 25 mg, 25 mg, Oral, TID, Darci Current Demisha Nokes, PA-C, 25 mg at 06/18/16 1048 .  docusate sodium (COLACE) capsule 100 mg, 100 mg, Oral, BID, Stark Klein, MD, 100 mg at 06/18/16 1047 .  enoxaparin (LOVENOX) injection 30 mg, 30 mg, Subcutaneous, Q24H, Cecilio Asper Batchelder, RPH, 30 mg at 06/18/16 1236 .  feeding supplement (PRO-STAT SUGAR FREE 64) liquid 30 mL, 30 mL, Oral, BID, Judeth Horn, MD, 30 mL at 06/16/16 2136 .  gabapentin (NEURONTIN) capsule 300 mg, 300 mg, Oral, BID, Earnstine Regal, PA-C, 300 mg at 06/18/16 1048 .  hydrALAZINE (APRESOLINE) injection 5 mg, 5 mg, Intravenous, Q6H PRN, Jerrye Beavers, PA-C .  methocarbamol (ROBAXIN) tablet 500 mg, 500 mg, Oral, Q8H PRN, Darci Current Dustine Stickler, PA-C .  ondansetron (ZOFRAN) tablet 4 mg, 4 mg, Oral, Q6H PRN **OR** ondansetron (ZOFRAN) injection 4 mg, 4 mg, Intravenous, Q6H PRN, Georganna Skeans, MD .  senna-docusate (Senokot-S) tablet 1 tablet, 1 tablet, Oral, BID, Jerrye Beavers, PA-C, 1 tablet at 06/18/16 1048 .  tamsulosin (FLOMAX) capsule 0.4 mg, 0.4 mg, Oral, Daily, Darci Current Xiao Graul, PA-C, 0.4 mg at 06/18/16 1048 .  traMADol (ULTRAM) tablet 50-100 mg, 50-100 mg, Oral, Q12H PRN, Earnstine Regal, PA-C, 50 mg at 06/17/16 2222  Follow-up Information    THOMPSON, DAVID A., MD. Schedule an appointment as soon as possible for a visit.    Specialty:  Orthopedic Surgery Why:  for approx 1 week from 06-13-16 to re-evaluate L distal radius fracture Contact information: [place] [address] [phone]        [place]. Call.   Why:  as needed. Contact information: [place] [phone]          Signed: Obie Dredge, [facility] 06/18/2016, 3:58 PM Pager: [phone] Consults: [phone] Mon-Fri 7:00 am-4:30 pm Sat-Sun 7:00 am-11:30 am

## 2016-06-18 NOTE — Progress Notes (Signed)
LOS: 5 days   Subjective: Urinary catheter placed yesterday with significant improvement of symptoms, reports sleeping very well last night. She has not had a BM since admission, and has not mobilized since Friday. Tolerating her current diet.  Denies fever, chills, chest pain, SOB, abdominal pain, nausea, vomiting.    Objective: Vital signs in last 24 hours: Temp:  [98 F (36.7 C)-98.3 F (36.8 C)] 98 F (36.7 C) (02/06 0512) Pulse Rate:  [100] 100 (02/06 0512) Resp:  [14-16] 14 (02/06 0512) BP: (143-150)/(75-87) 150/87 (02/06 0512) SpO2:  [96 %-97 %] 96 % (02/06 0512) Last BM Date: 06/13/16   Laboratory  CBC  Recent Labs  06/16/16 0337 06/17/16 0501  WBC 11.6* 9.2  HGB 8.5* 8.8*  HCT 25.5* 26.1*  PLT 147* 168   BMET  Recent Labs  06/16/16 0337 06/17/16 0501  NA 131* 134*  K 4.0 4.0  CL 99* 106  CO2 25 23  GLUCOSE 115* 103*  BUN 12 16  CREATININE 0.61 0.59  CALCIUM 8.4* 8.4*     Physical Exam General appearance: alert, oriented, no distress, cachectic Resp: normal effort, clear to auscultation bilaterally Cardio: 3/6 systolic murmur, regular rate and rhythm GI: positive bowel sounds, soft, non tender Extremities: LUE dressing clean, dry, intact, moderate hand swelling. LLE brace dry, intact, moderate swelling. All extremities warm, no cyanosis.  Assessment/Plan: Fall Left wrist fracture - splint, non op treatment currently, weight bearing with platform device per ortho, follow-up with ortho 02/09 Left superior and inferior pubic rami fractures- non op treatment, LLE TDWB due to knee per ortho  Left proximal tib-fib fracture- TDWB with immobilizer per otho Anemia - Hg 8.8 HTN - hydralazine PRN Urinary retention - foley placed, Urecholine and Flomax started 02/05  FEN - IVF, soft diet advance as tolerated, Pro-Stat ID -No antibiotics  VTE - SCDs, Lovenox  Dispo/plan- SNF placement, PT/OT, encourage IS   Filiberto Pinks, PA-Student General  Trauma PA Pager: [phone]  06/18/2016

## 2016-06-18 NOTE — Care Management Note (Signed)
Case Management Note  Patient Details  Name: Vanessa Pace MRN: [id number] Date of Birth: Dec 22, 1926  Subjective/Objective:   Pt medically stable for discharge today.                   Action/Plan: Plan dc to SNF, per CSW arrangements.    Expected Discharge Date:  06/18/16               Expected Discharge Plan:  Skilled Nursing Facility  In-House Referral:  Clinical Social Work  Discharge planning Services  CM Consult  Post Acute Care Choice:    Choice offered to:     DME Arranged:    DME Agency:     HH Arranged:    [place] Agency:     Status of Service:  Completed, signed off  If discussed at [employer], dates discussed:    Additional Comments:  Reinaldo Raddle, RN, BSN  Trauma/Neuro ICU Case Manager [phone]

## 2016-06-18 NOTE — Clinical Social Work Placement (Signed)
   CLINICAL SOCIAL WORK PLACEMENT  NOTE  Date:  06/18/2016  Patient Details  Name: Vanessa Pace MRN: [id number] Date of Birth: [birth year]  Clinical Social Work is seeking post-discharge placement for this patient at the [place] level of care (*CSW will initial, date and re-position this form in  chart as items are completed):  Yes   Patient/family provided with [place] Work Department's list of facilities offering this level of care within the geographic area requested by the patient (or if unable, by the patient's family).  Yes   Patient/family informed of their freedom to choose among providers that offer the needed level of care, that participate in Medicare, Medicaid or managed care program needed by the patient, have an available bed and are willing to accept the patient.  Yes   Patient/family informed of [facility]'s ownership interest in [facility] and [facility], as well as of the fact that they are under no obligation to receive care at these facilities.  PASRR submitted to EDS on       PASRR number received on       Existing PASRR number confirmed on 06/17/16     FL2 transmitted to all facilities in geographic area requested by pt/family on 06/17/16     FL2 transmitted to all facilities within larger geographic area on       Patient informed that his/her managed care company has contracts with or will negotiate with certain facilities, including the following:        Yes   Patient/family informed of bed offers received.  Patient chooses bed at [facility]     Physician recommends and patient chooses bed at      Patient to be transferred to [facility] on 06/18/16.  Patient to be transferred to facility by Ambulance     Patient family notified on 06/18/16 of transfer.  Name of family member notified:  Patient daughter Vanessa Pace at bedside     PHYSICIAN Please sign FL2, Please prepare priority discharge summary,  including medications     Additional Comment:   Barbette Or, [place]

## 2016-06-19 DIAGNOSIS — S79911A Unspecified injury of right hip, initial encounter: Secondary | ICD-10-CM | POA: Diagnosis not present

## 2016-06-19 DIAGNOSIS — S82252D Displaced comminuted fracture of shaft of left tibia, subsequent encounter for closed fracture with routine healing: Secondary | ICD-10-CM | POA: Diagnosis not present

## 2016-06-19 DIAGNOSIS — S82132A Displaced fracture of medial condyle of left tibia, initial encounter for closed fracture: Secondary | ICD-10-CM | POA: Diagnosis not present

## 2016-06-19 DIAGNOSIS — R2689 Other abnormalities of gait and mobility: Secondary | ICD-10-CM | POA: Diagnosis not present

## 2016-06-19 DIAGNOSIS — S6290XA Unspecified fracture of unspecified wrist and hand, initial encounter for closed fracture: Secondary | ICD-10-CM | POA: Diagnosis not present

## 2016-06-19 DIAGNOSIS — S59919D Unspecified injury of unspecified forearm, subsequent encounter: Secondary | ICD-10-CM | POA: Diagnosis not present

## 2016-06-19 DIAGNOSIS — S82452D Displaced comminuted fracture of shaft of left fibula, subsequent encounter for closed fracture with routine healing: Secondary | ICD-10-CM | POA: Diagnosis not present

## 2016-06-19 DIAGNOSIS — S3289XD Fracture of other parts of pelvis, subsequent encounter for fracture with routine healing: Secondary | ICD-10-CM | POA: Diagnosis not present

## 2016-06-19 DIAGNOSIS — E43 Unspecified severe protein-calorie malnutrition: Secondary | ICD-10-CM | POA: Diagnosis not present

## 2016-06-19 DIAGNOSIS — M810 Age-related osteoporosis without current pathological fracture: Secondary | ICD-10-CM | POA: Diagnosis not present

## 2016-06-19 DIAGNOSIS — S329XXA Fracture of unspecified parts of lumbosacral spine and pelvis, initial encounter for closed fracture: Secondary | ICD-10-CM | POA: Diagnosis not present

## 2016-06-19 DIAGNOSIS — M6281 Muscle weakness (generalized): Secondary | ICD-10-CM | POA: Diagnosis not present

## 2016-06-19 DIAGNOSIS — S52572A Other intraarticular fracture of lower end of left radius, initial encounter for closed fracture: Secondary | ICD-10-CM | POA: Diagnosis not present

## 2016-06-19 DIAGNOSIS — S52502D Unspecified fracture of the lower end of left radius, subsequent encounter for closed fracture with routine healing: Secondary | ICD-10-CM | POA: Diagnosis not present

## 2016-06-19 DIAGNOSIS — R4189 Other symptoms and signs involving cognitive functions and awareness: Secondary | ICD-10-CM | POA: Diagnosis not present

## 2016-06-19 DIAGNOSIS — Z9181 History of falling: Secondary | ICD-10-CM | POA: Diagnosis not present

## 2016-06-19 DIAGNOSIS — G8911 Acute pain due to trauma: Secondary | ICD-10-CM | POA: Diagnosis not present

## 2016-06-19 DIAGNOSIS — S8990XA Unspecified injury of unspecified lower leg, initial encounter: Secondary | ICD-10-CM | POA: Diagnosis not present

## 2016-06-19 DIAGNOSIS — R2681 Unsteadiness on feet: Secondary | ICD-10-CM | POA: Diagnosis not present

## 2016-06-19 DIAGNOSIS — S32502A Unspecified fracture of left pubis, initial encounter for closed fracture: Secondary | ICD-10-CM | POA: Diagnosis not present

## 2016-06-19 DIAGNOSIS — S82102A Unspecified fracture of upper end of left tibia, initial encounter for closed fracture: Secondary | ICD-10-CM | POA: Diagnosis not present

## 2016-06-19 DIAGNOSIS — S82401A Unspecified fracture of shaft of right fibula, initial encounter for closed fracture: Secondary | ICD-10-CM | POA: Diagnosis not present

## 2016-06-19 MED ORDER — ACETAMINOPHEN 325 MG PO TABS: 650.0000 mg | ORAL_TABLET | Freq: Three times a day (TID) | ORAL | Status: AC

## 2016-06-19 NOTE — Progress Notes (Signed)
Pt for discharge today to [facility] in [place].  Apparently dc delayed last evening due to paperwork issues.  Checked with CSW this morning; discharge summary completed and pt going to same bed, room 323.  Notified pt and daughter; Corey Harold has been contacted, per CSW, and she is next in que for pickup.    Reinaldo Raddle, RN, BSN  Trauma/Neuro ICU Case Manager [phone]

## 2016-06-19 NOTE — Clinical Social Work Note (Signed)
Patient was scheduled for discharge 2/6, however due to medication reconciliation with the hospital and facility and miscommunication, patient remained hospitalized with plans to discharge today.  Family updated.  Clinical Social Worker facilitated patient discharge including contacting patient family and facility to confirm patient discharge plans.  Clinical information faxed to facility and family agreeable with plan.  CSW arranged ambulance transport via [facility] to [facility].  RN to call report prior to discharge.  Clinical Social Worker will sign off for now as social work intervention is no longer needed. Please consult [place] again if new need arises.  Barbette Or, [place]

## 2016-06-19 NOTE — Discharge Summary (Signed)
[facility] Discharge Summary   Patient ID: HIFZA SCHWARK MRN: [id number] DOB/AGE: 1927/04/10 83 y.o.  Admit date: 06/13/2016 Discharge date: 06/19/2016  Admitting Diagnosis: Fall Pelvic fracture Left wrist fracture  Left tibia fracture Left fibula fracture  Discharge Diagnosis     Patient Active Problem List   Diagnosis Date Noted  . Protein-calorie malnutrition, severe 06/18/2016  . Pelvic fracture ([place]) 06/13/2016  . Fall 12/09/2013  . Fracture of olecranon process of right ulna 12/08/2013  . Hip fracture ([place]) 12/04/2013  . Closed fracture of right ulna 12/04/2013  . Minor head injury without loss of consciousness 12/04/2013  . Leucocytosis 12/04/2013    Consultants Orthopedic Surgery - Milly Jakob, MD (06/14/16)   Imaging: 06/13/16 CT HEAD and C-SPINE W/O - No acute intracranial process. No acute injury to C-spine.  06/13/16 DG Wrist, Left - Acute intra-articular fracture of the distal radius with moderate comminution and impaction. Possible nondisplaced ulnar styloid fracture. Scapholunate dissociation of indeterminate chronicity.  06/13/16 DG elbow complete, Left - No evidence of fracture or dislocation  06/13/16 DG knee complete, Left - Displaced/comminuted fractures of the proximal left tibia, with probable extension to the midline/medial tibial plateau, and impaction within the proximal metaphysis. Slightly displaced fracture of the proximal left fibula. Joint effusion.  06/13/16 DG hip unilateral  - Mildly displaced fractures of the left superior and inferior pubic rami.  06/13/16 DG tibia/fibula, Left - Comminuted fractures of the proximal tibia and fibula, better characterized on concurrent knee radiographs. Large lipohemarthrosis at the left knee joint.  06/13/16 CT knee left W/O - Comminuted tibial plateau fracture with a transverse and metaphyseal component, consistent with Schatzker type 6. Minimal articular surface depression of less than 2  mm. Mildly comminuted minimally displaced proximal fibular head/neck Fracture. Large lipohemarthrosis which tracks into a Baker cyst.  06/15/16 DG foot, left - No acute fracture or dislocation identified about the left foot.  Hospital Course:  83 year old female who presented to emergency department after falling down 3 concrete steps. Patient was unable to get up but denied loss of consciousness. Found down approximately 7 hours later by her daughter and was brought to ED for evaluation. She was found to have a left wrist fracture, left pubic rami fractures, and the left proximal tib-fib fracture. She was admitted for orthopedic surgery consult, pain control, and observation. Orthopedic surgery evaluated the patient and determined that all fractures could be managed non-operatively. Patient may weight weight through left upper extremity with platform device, touch down weight bear on left pubic rami fractures and tibal/fibular fractures. By hospital day #5 the patients pain was controlled, tolerating PO intake, working with therapies, and felt stable for discharge to skilled nursing facility. PT/OT did not recommend equipment at discharge.  Patient did have issues with urinary retention during her hospitalization, requiring replacement of foley catheter on 06/17/16. She was started on Urecholine and Flomax as prescribed above. Foley may be discontinued on 06/19/16 and medications stopped 06/21/16 if patient is voiding well.   Patient should follow up with Dr. Grandville Silos in 1 week and with the [facility] trauma clinic as needed.  I have personally reviewed the patients medication history on the [place] controlled substance database.        Allergies  Allergen Reactions  . Adhesive [Tape] Other (See Comments)    Tears skin  . Amoxicillin Hives and Rash    Has patient had a PCN reaction causing immediate rash, facial/tongue/throat swelling, SOB or lightheadedness with hypotension: Yes Has patient  had a PCN  reaction causing severe rash involving mucus membranes or skin necrosis: Yes Has patient had a PCN reaction that required hospitalization No Has patient had a PCN reaction occurring within the last 10 years: No If all of the above answers are "NO", then may proceed with Cephalosporin use.   [name] Kitchen Hydrocodone Nausea Only    Continue these medications at discharge:   .  acetaminophen (TYLENOL) tablet 650 mg, 650 mg, Oral, Q8H .  bethanechol (URECHOLINE) tablet 25 mg, 25 mg, Oral, TID .  docusate sodium (COLACE) capsule 100 mg, 100 mg, Oral, BID7 .  enoxaparin (LOVENOX) injection 30 mg, 30 mg, Subcutaneous, Q24H .  feeding supplement (PRO-STAT SUGAR FREE 64) liquid 30 mL, 30 mL, Oral, BID .  gabapentin (NEURONTIN) capsule 300 mg, 300 mg, Oral, BID .  hydrALAZINE (APRESOLINE) injection 5 mg, 5 mg, Intravenous, Q6H PRN .  methocarbamol (ROBAXIN) tablet 500 mg, 500 mg, Oral, Q8H PRN .  ondansetron (ZOFRAN) tablet 4 mg, 4 mg, Oral, Q6H PRN **OR** ondansetron (ZOFRAN) injection 4 mg, 4 mg, Intravenous .  senna-docusate (Senokot-S) tablet 1 tablet, 1 tablet, Oral, BID .  tamsulosin (FLOMAX) capsule 0.4 mg, 0.4 mg, Oral, Daily .  traMADol (ULTRAM) tablet 50-100 mg, 50-100 mg, Oral, Q12H PRN     Follow-up Information    THOMPSON, DAVID A., MD. Schedule an appointment as soon as possible for a visit.   Specialty:  Orthopedic Surgery Why:  for approx 1 week from 06-13-16 to re-evaluate L distal radius fracture Contact information: [place] [address] [phone]        [place]. Call.   Why:  as needed. Contact information: [place] [phone]          Signed: Obie Dredge, [facility] 06/18/2016, 3:58 PM Pager: [phone] Consults: [phone] Mon-Fri 7:00 am-4:30 pm Sat-Sun 7:00 am-11:30 am

## 2016-06-19 NOTE — Progress Notes (Signed)
Discharge instructions and medications reviewed with patient. Report called to [facility]. Patient in room awaiting transport.

## 2016-06-20 DIAGNOSIS — M810 Age-related osteoporosis without current pathological fracture: Secondary | ICD-10-CM

## 2016-06-20 DIAGNOSIS — S82102A Unspecified fracture of upper end of left tibia, initial encounter for closed fracture: Secondary | ICD-10-CM | POA: Diagnosis not present

## 2016-06-20 DIAGNOSIS — S6290XA Unspecified fracture of unspecified wrist and hand, initial encounter for closed fracture: Secondary | ICD-10-CM | POA: Diagnosis not present

## 2016-06-20 DIAGNOSIS — S329XXA Fracture of unspecified parts of lumbosacral spine and pelvis, initial encounter for closed fracture: Secondary | ICD-10-CM | POA: Diagnosis not present

## 2016-06-20 DIAGNOSIS — S82401A Unspecified fracture of shaft of right fibula, initial encounter for closed fracture: Secondary | ICD-10-CM | POA: Diagnosis not present

## 2016-06-26 DIAGNOSIS — S32502A Unspecified fracture of left pubis, initial encounter for closed fracture: Secondary | ICD-10-CM | POA: Diagnosis not present

## 2016-06-26 DIAGNOSIS — S82132A Displaced fracture of medial condyle of left tibia, initial encounter for closed fracture: Secondary | ICD-10-CM | POA: Diagnosis not present

## 2016-06-26 DIAGNOSIS — S52572A Other intraarticular fracture of lower end of left radius, initial encounter for closed fracture: Secondary | ICD-10-CM | POA: Diagnosis not present

## 2016-07-25 DIAGNOSIS — E441 Mild protein-calorie malnutrition: Secondary | ICD-10-CM | POA: Diagnosis not present

## 2016-07-25 DIAGNOSIS — S8290XA Unspecified fracture of unspecified lower leg, initial encounter for closed fracture: Secondary | ICD-10-CM | POA: Diagnosis not present

## 2016-07-25 DIAGNOSIS — S6290XA Unspecified fracture of unspecified wrist and hand, initial encounter for closed fracture: Secondary | ICD-10-CM | POA: Diagnosis not present

## 2016-07-29 DIAGNOSIS — S52572D Other intraarticular fracture of lower end of left radius, subsequent encounter for closed fracture with routine healing: Secondary | ICD-10-CM | POA: Diagnosis not present

## 2016-07-29 DIAGNOSIS — S82132D Displaced fracture of medial condyle of left tibia, subsequent encounter for closed fracture with routine healing: Secondary | ICD-10-CM | POA: Diagnosis not present

## 2016-07-29 DIAGNOSIS — S32502D Unspecified fracture of left pubis, subsequent encounter for fracture with routine healing: Secondary | ICD-10-CM | POA: Diagnosis not present

## 2016-07-31 DIAGNOSIS — S82452D Displaced comminuted fracture of shaft of left fibula, subsequent encounter for closed fracture with routine healing: Secondary | ICD-10-CM | POA: Diagnosis not present

## 2016-07-31 DIAGNOSIS — R2681 Unsteadiness on feet: Secondary | ICD-10-CM | POA: Diagnosis not present

## 2016-07-31 DIAGNOSIS — S3289XD Fracture of other parts of pelvis, subsequent encounter for fracture with routine healing: Secondary | ICD-10-CM | POA: Diagnosis not present

## 2016-07-31 DIAGNOSIS — M6281 Muscle weakness (generalized): Secondary | ICD-10-CM | POA: Diagnosis not present

## 2016-07-31 DIAGNOSIS — R2689 Other abnormalities of gait and mobility: Secondary | ICD-10-CM | POA: Diagnosis not present

## 2016-07-31 DIAGNOSIS — Z79899 Other long term (current) drug therapy: Secondary | ICD-10-CM | POA: Diagnosis not present

## 2016-07-31 DIAGNOSIS — E43 Unspecified severe protein-calorie malnutrition: Secondary | ICD-10-CM | POA: Diagnosis not present

## 2016-07-31 DIAGNOSIS — S52502D Unspecified fracture of the lower end of left radius, subsequent encounter for closed fracture with routine healing: Secondary | ICD-10-CM | POA: Diagnosis not present

## 2016-07-31 DIAGNOSIS — S82252D Displaced comminuted fracture of shaft of left tibia, subsequent encounter for closed fracture with routine healing: Secondary | ICD-10-CM | POA: Diagnosis not present

## 2016-07-31 DIAGNOSIS — R1031 Right lower quadrant pain: Secondary | ICD-10-CM | POA: Diagnosis not present

## 2016-07-31 DIAGNOSIS — R4189 Other symptoms and signs involving cognitive functions and awareness: Secondary | ICD-10-CM | POA: Diagnosis not present

## 2016-08-13 ENCOUNTER — Emergency Department
Admission: EM | Admit: 2016-08-13 | Discharge: 2016-08-14 | Disposition: A | Discharge status: Home / Self Care | Payer: Medicare Other | Attending: Emergency Medicine | Admitting: Emergency Medicine

## 2016-08-13 ENCOUNTER — Encounter: Payer: Self-pay | Admitting: Emergency Medicine

## 2016-08-13 DIAGNOSIS — Z79899 Other long term (current) drug therapy: Secondary | ICD-10-CM | POA: Diagnosis not present

## 2016-08-13 DIAGNOSIS — R1031 Right lower quadrant pain: Secondary | ICD-10-CM

## 2016-08-13 MED ORDER — SIMETHICONE 80 MG PO CHEW
80.0000 mg | CHEWABLE_TABLET | Freq: Once | ORAL | Status: AC
  Administered 2016-08-14: 80 mg via ORAL
  Filled 2016-08-13: qty 1

## 2016-08-13 NOTE — ED Provider Notes (Signed)
[facility] Emergency Department Provider Note  ____________________________________________   First MD Initiated Contact with Patient 08/13/16 2321     (approximate)  I have reviewed the triage vital signs and the nursing notes.   HISTORY  Chief Complaint Abdominal Pain    HPI Vanessa Pace is a 83 y.o. female with a recent history of multiple orthopedic injuries for which she is at 20 weeks rehabilitation.  She arrives by EMS for evaluation of acute onset right lower quadrant pain that occurred prior to arrival.  It is uncertain how long she had the pain although it may have been as long as 3 hours.  However she reports that as soon as she was loaded into the ambulance the pain completely went away and has not come back.  She describes it as a sharp pain in the right lower quadrant.  Nothing in particular made it worse and it got better when she was loaded into the ambulance.  She denies fever/chills, nausea, vomiting, diarrhea, constipation, dysuria, shortness of breath, chest pain.  She has never had an appendectomy nor cholecystectomy.  She does not frequently have abdominal pain.  She had a recent pelvic fracture but it was on the left side.  She has had no new falls.  Her 2 adult daughters are with her in the room at this time.   Past Medical History:  Diagnosis Date  . Arthritis   . Cancer ([place])    skin/face  . Compression fracture of L1 lumbar vertebra (HCC)    L3 and L4  . Dementia   . DVT (deep venous thrombosis) ([place])   . Elbow fracture 12/04/2013   rt elbow  . Family history of anesthesia complication    Daughter is difficult to intubate   . GERD (gastroesophageal reflux disease)   . Macular degeneration   . PONV (postoperative nausea and vomiting)     Patient Active Problem List   Diagnosis Date Noted  . Protein-calorie malnutrition, severe 06/18/2016  . Pelvic fracture ([place]) 06/13/2016  . Fall 12/09/2013  . Fracture of olecranon  process of right ulna 12/08/2013  . Hip fracture ([place]) 12/04/2013  . Closed fracture of right ulna 12/04/2013  . Minor head injury without loss of consciousness 12/04/2013  . Leucocytosis 12/04/2013    Past Surgical History:  Procedure Laterality Date  . cataracts     B/L  . COLONOSCOPY W/ BIOPSIES AND POLYPECTOMY    . femur surgery    . FRACTURE SURGERY     right hip  . HIP SURGERY    . KYPHOPLASTY N/A 04/11/2016   Procedure: LUMBAR 1, LUMBAR 3, LUMBAR 4 KYPHOPLASTY;  Surgeon: Phylliss Bob, MD;  Location: [place];  Service: Orthopedics;  Laterality: N/A;  LUMBAR1, LUMBAR 3, LUMBAR 4 KYPHOPLASTY  . ORIF ELBOW FRACTURE Right 12/06/2013   Procedure: OPEN REDUCTION INTERNAL FIXATION (ORIF) ELBOW/OLECRANON FRACTURE;  Surgeon: Jolyn Nap, MD;  Location: [place];  Service: Orthopedics;  Laterality: Right;    Prior to Admission medications   Medication Sig Start Date End Date Taking? Authorizing Provider  acetaminophen (TYLENOL) 325 MG tablet Take 2 tablets (650 mg total) by mouth every 8 (eight) hours. 06/19/16   Darci Current Simaan, PA-C  Amino Acids-Protein Hydrolys (FEEDING SUPPLEMENT, PRO-STAT SUGAR FREE 64,) LIQD Take 30 mLs by mouth 2 (two) times daily. 06/18/16   Darci Current Simaan, PA-C  bethanechol (URECHOLINE) 25 MG tablet Take 1 tablet (25 mg total) by mouth 3 (three) times daily.  06/18/16   Darci Current Simaan, PA-C  diazepam (VALIUM) 5 MG tablet Take 0.5 mg by mouth every 6 (six) hours as needed for muscle spasms.    Historical Provider, MD  DiphenhydrAMINE HCl, Sleep, (UNISOM SLEEPGELS) 50 MG CAPS Take 50 mg by mouth at bedtime.    Historical Provider, MD  docusate sodium (COLACE) 100 MG capsule Take 1 capsule (100 mg total) by mouth 2 (two) times daily. 06/18/16   Darci Current Simaan, PA-C  enoxaparin (LOVENOX) 30 MG/0.3ML injection Inject 0.3 mLs (30 mg total) into the skin daily. 06/19/16   Darci Current Simaan, PA-C  gabapentin (NEURONTIN) 300 MG capsule Take 1 capsule (300 mg total) by  mouth 2 (two) times daily. 06/18/16   Darci Current Simaan, PA-C  hydrALAZINE (APRESOLINE) 20 MG/ML injection Inject 0.25 mLs (5 mg total) into the vein every 6 (six) hours as needed (160/90). 06/18/16   Darci Current Simaan, PA-C  methocarbamol (ROBAXIN) 500 MG tablet Take 1 tablet (500 mg total) by mouth every 8 (eight) hours as needed for muscle spasms. 06/18/16   Darci Current Simaan, PA-C  Multiple Vitamins-Minerals (ICAPS AREDS 2 PO) Take 1 capsule by mouth 3 (three) times a week.    Historical Provider, MD  ondansetron (ZOFRAN) 4 MG tablet Take 1 tablet (4 mg total) by mouth every 6 (six) hours as needed for nausea. 06/18/16   Darci Current Simaan, PA-C  senna-docusate (SENOKOT-S) 8.6-50 MG tablet Take 1 tablet by mouth 2 (two) times daily. 06/18/16   Jill Alexanders, PA-C  tamsulosin (FLOMAX) 0.4 MG CAPS capsule Take 1 capsule (0.4 mg total) by mouth daily. 06/19/16   Darci Current Simaan, PA-C  traMADol (ULTRAM) 50 MG tablet Take 1-2 tablets (50-100 mg total) by mouth every 12 (twelve) hours as needed for moderate pain or severe pain. 06/18/16   Jill Alexanders, PA-C    Allergies Adhesive [tape]; Amoxicillin; and Hydrocodone  Family History  Problem Relation Age of Onset  . CAD Father   . CAD Son   . Cervical cancer Daughter     Social History Social History  Substance Use Topics  . Smoking status: Never Smoker  . Smokeless tobacco: Never Used     Comment: exposed to 2nd hand smoke   . Alcohol use Yes     Comment: occasionally    Review of Systems Constitutional: No fever/chills Eyes: No visual changes. ENT: No sore throat. Cardiovascular: Denies chest pain. Respiratory: Denies shortness of breath. Gastrointestinal: Acute onset right lower quadrant pain that also acutely completely resolved Genitourinary: Negative for dysuria. Musculoskeletal: Negative for back pain. Skin: Negative for rash. Neurological: Negative for headaches, focal weakness or numbness.  10-point ROS otherwise  negative.  ____________________________________________   PHYSICAL EXAM:  VITAL SIGNS: ED Triage Vitals  Enc Vitals Group     BP 08/13/16 2239 (!) 142/86     Pulse Rate 08/13/16 2239 (!) 101     Resp 08/13/16 2239 (!) 23     Temp 08/13/16 2239 97.8 F (36.6 C)     Temp Source 08/13/16 2239 Oral     SpO2 08/13/16 2239 97 %     Weight 08/13/16 2255 90 lb (40.8 kg)     Height 08/13/16 2255 5' (1.524 m)     Head Circumference --      Peak Flow --      Pain Score --      Pain Loc --      Pain Edu? --  Excl. in [place]? --     Constitutional: Alert and oriented. Well appearing and in no acute distress.  Slightly anxious. Eyes: Conjunctivae are normal. PERRL. EOMI. Head: Atraumatic. Nose: No congestion/rhinnorhea. Mouth/Throat: Mucous membranes are moist. Neck: No stridor.  No meningeal signs.   Cardiovascular: Borderline tachycardia, regular rhythm. Good peripheral circulation. Grossly normal heart sounds. Respiratory: Normal respiratory effort.  No retractions. Lungs CTAB. Gastrointestinal: Soft and nontender. No distention.  Musculoskeletal: No lower extremity tenderness nor edema. No gross deformities of extremities. Neurologic:  Normal speech and language. No gross focal neurologic deficits are appreciated.  Skin:  Skin is warm, dry and intact. No rash noted. Psychiatric: Mood and affect are slightly anxious but essentially normal. Speech and behavior are normal.  ____________________________________________   LABS (all labs ordered are listed, but only abnormal results are displayed)  Labs Reviewed - No data to display ____________________________________________  EKG  ED ECG REPORT I, Keyri Salberg, the attending physician, personally viewed and interpreted this ECG.  Date: 08/13/2016 EKG Time: 22:49 Rate: 102 Rhythm: borderline sinus tachycardia QRS Axis: normal Intervals: normal ST/T Wave abnormalities: normal Conduction Disturbances: none Narrative  Interpretation: unremarkable  ____________________________________________  RADIOLOGY   No results found.  ____________________________________________   PROCEDURES  Critical Care performed: No   Procedure(s) performed:   Procedures   ____________________________________________   INITIAL IMPRESSION / ASSESSMENT AND PLAN / ED COURSE  Pertinent labs & imaging results that were available during my care of the patient were reviewed by me and considered in my medical decision making (see chart for details).  The patient has no abdominal tenderness to palpation.  She does have some general musculoskeletal pain when she is moved around but that is chronic due to her recent fractures.  Her daughters think that she likely had some gas which resolved with position changes and I am inclined to agree.  Her blood pressure is stable.  She has borderline tachycardia but I think this is due to her anxiety because she is repeatedly telling me that she is in no pain at this time but worries about what may have caused it.  I discussed with the patient and her daughters the fact that we do worry about elderly patients with abdominal pain but that her physical exam is reassuring and the history is reassuring.  They do not feel strongly about a big workup including lab work and CT scans which at this point I believe would be unnecessary.  We will try some simethicone and a ginger ale/crackers challenge and I will watch her for a while to see if the pain returns which would prompt further workup.  The daughters and patient are very happy with this plan.   Clinical Course as of Aug 14 232  Wed Aug 14, 2016  0129 The patient is asymptomatic and feels fine.  She has had no more abdominal pain and wants to leave.  Her daughters want to transport her by private vehicle.  They all do not want additional workup at this time.    I gave my usual and customary return precautions.     [CF]    Clinical Course  User Index [CF] Hinda Kehr, MD    ____________________________________________  FINAL CLINICAL IMPRESSION(S) / ED DIAGNOSES  Final diagnoses:  Right lower quadrant abdominal pain     MEDICATIONS GIVEN DURING THIS VISIT:  Medications  simethicone (MYLICON) chewable tablet 80 mg (80 mg Oral Given 08/14/16 0034)     NEW OUTPATIENT MEDICATIONS STARTED DURING THIS  VISIT:  Discharge Medication List as of 08/14/2016  1:32 AM      Discharge Medication List as of 08/14/2016  1:32 AM      Discharge Medication List as of 08/14/2016  1:32 AM       Note:  This document was prepared using Dragon voice recognition software and may include unintentional dictation errors.    Hinda Kehr, MD 08/14/16 [phone]

## 2016-08-13 NOTE — ED Triage Notes (Signed)
Patient to ER for c/o RLQ abd pain prior to arrival. Patient from [facility] and arrives via [employer]. Patient states she had severe abd pain x3 hours, resolved en route. Patient has no complaints upon arrival. Patient is at [facility] for rehab from pelvic fracture. Patient in no acute distress.

## 2016-08-14 ENCOUNTER — Telehealth: Payer: Self-pay

## 2016-08-14 NOTE — ED Notes (Signed)
Pt drinking gingerale at this time. Will reassess patient's tolerance.

## 2016-08-14 NOTE — Telephone Encounter (Signed)
PLEASE NOTE: All timestamps contained within this report are represented as [place] Standard Time. CONFIDENTIALTY NOTICE: This fax transmission is intended only for the addressee. It contains information that is legally privileged, confidential or otherwise protected from use or disclosure. If you are not the intended recipient, you are strictly prohibited from reviewing, disclosing, copying using or disseminating any of this information or taking any action in reliance on or regarding this information. If you have received this fax in error, please notify us immediately by telephone so that we can arrange for its return to [place]. Phone: [phone], Toll-Free: [phone], Fax: [phone] Page: 1 of 1 Call Id: [id number] [place] Night - Client Nonclinical Telephone Record [place] Night - Client Client Site [place] Physician Viviana Simpler - MD Contact Type Call Who Is Calling Physician / Provider / Hospital Call Type Provider Call Message Only Reason for Call Request to send message to Office Initial Comment Caller Verdis Frederickson from [facility] wants to let Dr know that pt was sent to the ER with sever pain meds did not help with the pain Pt Vanessa Pace [birth year] Call Back # [phone] Call Closed By: Ander Gaster Transaction Date/Time: 08/14/2016 1:09:56 AM (ET)

## 2016-08-14 NOTE — Telephone Encounter (Signed)
I believe pt name should be [name].

## 2016-08-14 NOTE — Telephone Encounter (Signed)
Was seen in the ER and the abdominal pain resolved and had benign evaluation. Back to rehab--but due to be discharged home soon

## 2016-08-14 NOTE — Discharge Instructions (Signed)
You have been seen in the Emergency Department (ED) for abdominal pain.  Because your pain has completely resolved, we all agreed to not proceed with any additional workup at this time.  Please follow up as instructed above regarding today?s emergent visit and the symptoms that are bothering you.  Return to the ED if your abdominal pain worsens or fails to improve, you develop bloody vomiting, bloody diarrhea, you are unable to tolerate fluids due to vomiting, fever greater than 101, or other symptoms that concern you.

## 2016-08-23 ENCOUNTER — Encounter (HOSPITAL_COMMUNITY): Payer: Self-pay | Admitting: *Deleted

## 2016-08-23 ENCOUNTER — Emergency Department (HOSPITAL_COMMUNITY): Payer: Medicare Other

## 2016-08-23 ENCOUNTER — Emergency Department (HOSPITAL_COMMUNITY)
Admission: EM | Admit: 2016-08-23 | Discharge: 2016-08-24 | Disposition: A | Discharge status: Home / Self Care | Payer: Medicare Other | Attending: Emergency Medicine | Admitting: Emergency Medicine

## 2016-08-23 DIAGNOSIS — S8992XD Unspecified injury of left lower leg, subsequent encounter: Secondary | ICD-10-CM | POA: Diagnosis present

## 2016-08-23 DIAGNOSIS — Z85828 Personal history of other malignant neoplasm of skin: Secondary | ICD-10-CM | POA: Insufficient documentation

## 2016-08-23 DIAGNOSIS — M7989 Other specified soft tissue disorders: Secondary | ICD-10-CM

## 2016-08-23 DIAGNOSIS — W1830XD Fall on same level, unspecified, subsequent encounter: Secondary | ICD-10-CM | POA: Diagnosis not present

## 2016-08-23 DIAGNOSIS — S82102D Unspecified fracture of upper end of left tibia, subsequent encounter for closed fracture with routine healing: Secondary | ICD-10-CM | POA: Insufficient documentation

## 2016-08-23 DIAGNOSIS — S82832A Other fracture of upper and lower end of left fibula, initial encounter for closed fracture: Secondary | ICD-10-CM | POA: Diagnosis not present

## 2016-08-23 DIAGNOSIS — S89202D Unspecified physeal fracture of upper end of left fibula, subsequent encounter for fracture with routine healing: Secondary | ICD-10-CM | POA: Insufficient documentation

## 2016-08-23 DIAGNOSIS — S99922A Unspecified injury of left foot, initial encounter: Secondary | ICD-10-CM | POA: Diagnosis not present

## 2016-08-23 DIAGNOSIS — S82832D Other fracture of upper and lower end of left fibula, subsequent encounter for closed fracture with routine healing: Secondary | ICD-10-CM

## 2016-08-23 DIAGNOSIS — S82102A Unspecified fracture of upper end of left tibia, initial encounter for closed fracture: Secondary | ICD-10-CM | POA: Diagnosis not present

## 2016-08-23 DIAGNOSIS — M79662 Pain in left lower leg: Secondary | ICD-10-CM | POA: Diagnosis not present

## 2016-08-23 DIAGNOSIS — I824Z2 Acute embolism and thrombosis of unspecified deep veins of left distal lower extremity: Secondary | ICD-10-CM | POA: Diagnosis not present

## 2016-08-23 DIAGNOSIS — W19XXXA Unspecified fall, initial encounter: Secondary | ICD-10-CM

## 2016-08-23 DIAGNOSIS — S82142D Displaced bicondylar fracture of left tibia, subsequent encounter for closed fracture with routine healing: Secondary | ICD-10-CM | POA: Diagnosis not present

## 2016-08-23 DIAGNOSIS — M79672 Pain in left foot: Secondary | ICD-10-CM | POA: Diagnosis not present

## 2016-08-23 MED ORDER — ACETAMINOPHEN 325 MG PO TABS
ORAL_TABLET | ORAL | Status: AC
  Filled 2016-08-23: qty 2

## 2016-08-23 MED ORDER — ENOXAPARIN SODIUM 40 MG/0.4ML ~~LOC~~ SOLN
40.0000 mg | Freq: Once | SUBCUTANEOUS | Status: AC
  Administered 2016-08-24: 40 mg via SUBCUTANEOUS
  Filled 2016-08-23: qty 0.4

## 2016-08-23 MED ORDER — ACETAMINOPHEN 325 MG PO TABS
650.0000 mg | ORAL_TABLET | Freq: Once | ORAL | Status: AC
  Administered 2016-08-23: 650 mg via ORAL

## 2016-08-23 MED ORDER — FENTANYL CITRATE (PF) 100 MCG/2ML IJ SOLN
25.0000 ug | Freq: Once | INTRAMUSCULAR | Status: AC
  Administered 2016-08-23: 25 ug via INTRAVENOUS
  Filled 2016-08-23: qty 2

## 2016-08-23 NOTE — ED Triage Notes (Signed)
Pt states fall last Thursday, and then over the weekend, but did not come to be seen for fall until Today, b/c her daughter is back in town.  Recent L tib/fib fx in Feb.  L leg swollen, L foot red and swollen.  Pt states L foot pain increases with ambulation on walker.

## 2016-08-23 NOTE — ED Provider Notes (Signed)
[facility] DEPT Provider Note   CSN: [id number] Arrival date & time: 08/23/16  1356     History   Chief Complaint Chief Complaint  Patient presents with  . Fall    HPI Vanessa Pace is a 83 y.o. female.  HPI   Patient is a 83 year old female with history of arthritis, dementia, DVT and skin/face cancer who presents to the ED with complaint of left lower leg pain and swelling. Patient reports having 2 mechanical falls at home this past week with the last one being 5 days ago. She reports falling while trying to get out of her recliner at home and tripping over her recliner as she tried to step over the leg rests. Denies head injury or LOC. Reports having worsening pain and swelling to her left lower leg since the falls. Patient states she has been taking Tylenol at home with intermittent relief. Patient's daughter at bedside states that she has been able to walk at home using her walker but reports associated pain. Denies fever, headache, neck pain, back pain, cough, shortness of breath, chest pain, numbness, weakness. Denies use of anticoagulants. Patient's daughter states she was admitted in February after a fall resulting in fractures of her left lower leg and left wrist. Daughter states patient was discharged home from rehabilitation facility last week.   PCP- Dr. Kathlene November- Dr. Grandville Silos  Past Medical History:  Diagnosis Date  . Arthritis   . Cancer ([place])    skin/face  . Compression fracture of L1 lumbar vertebra (HCC)    L3 and L4  . Dementia   . DVT (deep venous thrombosis) ([place])   . Elbow fracture 12/04/2013   rt elbow  . Family history of anesthesia complication    Daughter is difficult to intubate   . GERD (gastroesophageal reflux disease)   . Macular degeneration   . PONV (postoperative nausea and vomiting)     Patient Active Problem List   Diagnosis Date Noted  . Protein-calorie malnutrition, severe 06/18/2016  . Pelvic fracture ([place]) 06/13/2016  .  Fall 12/09/2013  . Fracture of olecranon process of right ulna 12/08/2013  . Hip fracture ([place]) 12/04/2013  . Closed fracture of right ulna 12/04/2013  . Minor head injury without loss of consciousness 12/04/2013  . Leucocytosis 12/04/2013    Past Surgical History:  Procedure Laterality Date  . cataracts     B/L  . COLONOSCOPY W/ BIOPSIES AND POLYPECTOMY    . femur surgery    . FRACTURE SURGERY     right hip  . HIP SURGERY    . KYPHOPLASTY N/A 04/11/2016   Procedure: LUMBAR 1, LUMBAR 3, LUMBAR 4 KYPHOPLASTY;  Surgeon: Phylliss Bob, MD;  Location: [facility];  Service: Orthopedics;  Laterality: N/A;  LUMBAR1, LUMBAR 3, LUMBAR 4 KYPHOPLASTY  . ORIF ELBOW FRACTURE Right 12/06/2013   Procedure: OPEN REDUCTION INTERNAL FIXATION (ORIF) ELBOW/OLECRANON FRACTURE;  Surgeon: Jolyn Nap, MD;  Location: [facility];  Service: Orthopedics;  Laterality: Right;    OB History    No data available       Home Medications    Prior to Admission medications   Medication Sig Start Date End Date Taking? Authorizing Provider  acetaminophen (TYLENOL) 325 MG tablet Take 2 tablets (650 mg total) by mouth every 8 (eight) hours. Patient taking differently: Take 650 mg by mouth every 6 (six) hours as needed for mild pain.  06/19/16  Yes Darci Current Simaan, PA-C  DiphenhydrAMINE HCl, Sleep, (UNISOM SLEEPGELS) 50 MG  CAPS Take 50 mg by mouth at bedtime.   Yes Historical Provider, MD  Multiple Vitamins-Minerals (ICAPS AREDS 2 PO) Take 1 capsule by mouth 2 (two) times daily.    Yes Historical Provider, MD    Family History Family History  Problem Relation Age of Onset  . CAD Father   . CAD Son   . Cervical cancer Daughter     Social History Social History  Substance Use Topics  . Smoking status: Never Smoker  . Smokeless tobacco: Never Used     Comment: exposed to 2nd hand smoke   . Alcohol use Yes     Comment: occasionally     Allergies   Adhesive [tape]; Amoxicillin; and Hydrocodone   Review of  Systems Review of Systems  Musculoskeletal: Positive for arthralgias (left ankle) and joint swelling.  All other systems reviewed and are negative.    Physical Exam Updated Vital Signs BP (!) 144/90   Pulse (!) 105   Temp 98.8 F (37.1 C) (Oral)   Resp 18   SpO2 96%   Physical Exam  Constitutional: She is oriented to person, place, and time. She appears well-developed and well-nourished. She appears distressed.  HENT:  Head: Normocephalic and atraumatic. Head is without raccoon's eyes, without Battle's sign, without abrasion, without contusion and without laceration.  Right Ear: Tympanic membrane normal. No hemotympanum.  Left Ear: Tympanic membrane normal. No hemotympanum.  Nose: Nose normal. Right sinus exhibits no maxillary sinus tenderness and no frontal sinus tenderness. Left sinus exhibits no maxillary sinus tenderness and no frontal sinus tenderness.  Mouth/Throat: Uvula is midline and mucous membranes are normal. Oropharyngeal exudate present. No posterior oropharyngeal edema, posterior oropharyngeal erythema or tonsillar abscesses. No tonsillar exudate.  Eyes: Conjunctivae and EOM are normal. Pupils are equal, round, and reactive to light. Right eye exhibits no discharge. Left eye exhibits no discharge. No scleral icterus.  Neck: Normal range of motion. Neck supple.  Cardiovascular: Regular rhythm, normal heart sounds and intact distal pulses.   Mildly tachycardic, HR 102  Pulmonary/Chest: Effort normal and breath sounds normal. No respiratory distress. She has no wheezes. She has no rales. She exhibits no tenderness.  Abdominal: Soft. Bowel sounds are normal. She exhibits no distension and no mass. There is no tenderness. There is no rebound and no guarding. No hernia.  Musculoskeletal: She exhibits tenderness. She exhibits no deformity.  1+ pitting edema present to left foot and lower leg with erythema and warmth present. Diffuse TTP over ankle, worse over left lateral  malleolus. Left calf TTP. Mild TTP over left anterior knee with dec ROM due to reported pain. FROM of left foot and ankle and hip with 5/5 strength. FROM of bilateral hips with no pelvic instability. No midline spinal tenderness. FROM of neck and back. FROM of BUE with 5/5 strength. Sensation grossly intact. 1+ DP and radial pulse.   Neurological: She is alert and oriented to person, place, and time.  Skin: Skin is warm. Capillary refill takes less than 2 seconds. She is diaphoretic.  Nursing note and vitals reviewed.    ED Treatments / Results  Labs (all labs ordered are listed, but only abnormal results are displayed) Labs Reviewed - No data to display  EKG  EKG Interpretation None       Radiology Dg Knee Complete 4 Views Left  Result Date: 08/23/2016 CLINICAL DATA:  Recent fall with left lower leg pain, initial encounter EXAM: LEFT KNEE - COMPLETE 4+ VIEW COMPARISON:  06/13/2016 FINDINGS: Previously  seen fractures in the proximal tibia and proximal fibula are again identified. Some impaction is noted at both fracture sites. Some mild posterior angulation of the distal tibia and fibula with respect to the proximal fracture fragments is noted. No joint effusion is seen. IMPRESSION: Previous proximal tibial and fibular fractures with some impaction and mild angulation when compared with the prior exam. No definitive acute abnormality is seen. Electronically Signed   By: Inez Catalina M.D.   On: 08/23/2016 15:53   Dg Foot Complete Left  Result Date: 08/23/2016 CLINICAL DATA:  Recent fall with left foot pain, initial encounter EXAM: LEFT FOOT - COMPLETE 3+ VIEW COMPARISON:  None. FINDINGS: Mild irregularity is noted at the head of the fifth metatarsal which may represent an undisplaced fracture. Some degenerative changes in the second MTP joint are noted. Significant increased sclerosis is noted within the calcaneus on the lateral projection consistent with prior fracture and some callus  formation. No new fracture is seen. IMPRESSION: Changes of prior calcaneal fracture with some healing. Question of fifth distal metatarsal fracture. Electronically Signed   By: Inez Catalina M.D.   On: 08/23/2016 15:56    Procedures Procedures (including critical care time)  Medications Ordered in ED Medications  acetaminophen (TYLENOL) tablet 650 mg (650 mg Oral Given 08/23/16 1501)  fentaNYL (SUBLIMAZE) injection 25 mcg (25 mcg Intravenous Given 08/23/16 2038)  enoxaparin (LOVENOX) injection 40 mg (40 mg Subcutaneous Given 08/24/16 0016)  fentaNYL (SUBLIMAZE) injection 50 mcg (50 mcg Intravenous Given 08/24/16 0030)     Initial Impression / Assessment and Plan / ED Course  I have reviewed the triage vital signs and the nursing notes.  Pertinent labs & imaging results that were available during my care of the patient were reviewed by me and considered in my medical decision making (see chart for details).     Patient presents with worsening left lower leg pain and swelling over the past week. Endorses 2 mechanical falls since being discharged home from a rehabilitation facility last week 2/2 left proximal tib/fib fx in February 2018. Denies head injury or LOC. Denies use of anticoagulants. Daughter states pt has still been able to ambulate at home but endorses severe pain. Hx DVT. VSS. Exam showed 1+ pitting edema present to left foot and lower leg with erythema and warmth present. Diffuse TTP over ankle, worse over left lateral malleolus. Left calf TTP. Mild TTP over left anterior knee with dec ROM due to reported pain. BLE otherwise neurovascularly intact. No midline spinal tenderness. No evidence of head injury. Pt given pain meds in the ED. Left knee xray showed previous proximal tib/fib fx with some impaction and mild angulation compared to prior exam. Left ankle xray with healing calcaneal fx; question of fifth distal metatarsal fx, doubt as pt with without tenderness on exam. Discussed pt  with Dr. Christy Gentles, plan to consult ortho regarding changes noted to tib/fib fx and order venous US for evaluation of left leg DVT. Dr. Grandville Silos advised to place pt in knee immobilizer and remain nonweight bearing until her scheduled follow up appointment on Monday (08/26/16). Pt reports having knee immobilizer at home that she is requesting to use. I was notified by nurse that [place] tech was not notified prior to leaving regarding pt's order for LE venous. As a result, pt and family were notified regarding miscommunication; pt was given single dose of Lovenox in the ED and instruction to return in the morning to have her [place] completed. Pt and family report  understanding and agreement. Discussed return precautions.   Final Clinical Impressions(s) / ED Diagnoses   Final diagnoses:  Fall, initial encounter  Closed fracture of proximal end of left tibia and fibula with routine healing, subsequent encounter  Pain and swelling of left lower leg    New Prescriptions New Prescriptions   No medications on file     Nona Dell, [place] 08/24/16 0039    Virgel Manifold, MD 08/28/16 1233

## 2016-08-24 ENCOUNTER — Ambulatory Visit (HOSPITAL_BASED_OUTPATIENT_CLINIC_OR_DEPARTMENT_OTHER)
Admission: RE | Admit: 2016-08-24 | Discharge: 2016-08-24 | Disposition: A | Discharge status: Home / Self Care | Payer: Medicare Other | Source: Ambulatory Visit | Attending: Emergency Medicine | Admitting: Emergency Medicine

## 2016-08-24 ENCOUNTER — Emergency Department (HOSPITAL_COMMUNITY)
Admission: EM | Admit: 2016-08-24 | Discharge: 2016-08-24 | Disposition: A | Discharge status: Home / Self Care | Payer: Medicare Other | Source: Home / Self Care | Attending: Emergency Medicine | Admitting: Emergency Medicine

## 2016-08-24 ENCOUNTER — Encounter (HOSPITAL_COMMUNITY): Payer: Self-pay | Admitting: *Deleted

## 2016-08-24 DIAGNOSIS — Z79899 Other long term (current) drug therapy: Secondary | ICD-10-CM | POA: Insufficient documentation

## 2016-08-24 DIAGNOSIS — M7989 Other specified soft tissue disorders: Secondary | ICD-10-CM

## 2016-08-24 DIAGNOSIS — I82442 Acute embolism and thrombosis of left tibial vein: Secondary | ICD-10-CM

## 2016-08-24 DIAGNOSIS — I82492 Acute embolism and thrombosis of other specified deep vein of left lower extremity: Secondary | ICD-10-CM

## 2016-08-24 DIAGNOSIS — I82402 Acute embolism and thrombosis of unspecified deep veins of left lower extremity: Secondary | ICD-10-CM | POA: Insufficient documentation

## 2016-08-24 DIAGNOSIS — M79609 Pain in unspecified limb: Secondary | ICD-10-CM

## 2016-08-24 DIAGNOSIS — I824Z2 Acute embolism and thrombosis of unspecified deep veins of left distal lower extremity: Secondary | ICD-10-CM | POA: Diagnosis not present

## 2016-08-24 DIAGNOSIS — Z85828 Personal history of other malignant neoplasm of skin: Secondary | ICD-10-CM

## 2016-08-24 DIAGNOSIS — S82102D Unspecified fracture of upper end of left tibia, subsequent encounter for closed fracture with routine healing: Secondary | ICD-10-CM | POA: Diagnosis not present

## 2016-08-24 LAB — CBC WITH DIFFERENTIAL/PLATELET
Basophils Absolute: 0 10*3/uL (ref 0.0–0.1)
Basophils Relative: 0 %
EOS ABS: 0.1 10*3/uL (ref 0.0–0.7)
Eosinophils Relative: 1 %
HEMATOCRIT: 38.4 % (ref 36.0–46.0)
HEMOGLOBIN: 12.8 g/dL (ref 12.0–15.0)
LYMPHS ABS: 1.3 10*3/uL (ref 0.7–4.0)
LYMPHS PCT: 11 %
MCH: 30.7 pg (ref 26.0–34.0)
MCHC: 33.3 g/dL (ref 30.0–36.0)
MCV: 92.1 fL (ref 78.0–100.0)
MONOS PCT: 11 %
Monocytes Absolute: 1.3 10*3/uL — ABNORMAL HIGH (ref 0.1–1.0)
NEUTROS ABS: 9.4 10*3/uL — AB (ref 1.7–7.7)
NEUTROS PCT: 77 %
Platelets: 281 10*3/uL (ref 150–400)
RBC: 4.17 MIL/uL (ref 3.87–5.11)
RDW: 13 % (ref 11.5–15.5)
WBC: 12.1 10*3/uL — AB (ref 4.0–10.5)

## 2016-08-24 LAB — I-STAT CHEM 8, ED
BUN: 10 mg/dL (ref 6–20)
CALCIUM ION: 1.08 mmol/L — AB (ref 1.15–1.40)
CHLORIDE: 100 mmol/L — AB (ref 101–111)
CREATININE: 0.5 mg/dL (ref 0.44–1.00)
Glucose, Bld: 93 mg/dL (ref 65–99)
HCT: 39 % (ref 36.0–46.0)
Hemoglobin: 13.3 g/dL (ref 12.0–15.0)
Potassium: 4.3 mmol/L (ref 3.5–5.1)
Sodium: 134 mmol/L — ABNORMAL LOW (ref 135–145)
TCO2: 27 mmol/L (ref 0–100)

## 2016-08-24 MED ORDER — ENOXAPARIN SODIUM 40 MG/0.4ML ~~LOC~~ SOLN
40.0000 mg | SUBCUTANEOUS | Status: DC
  Administered 2016-08-24: 40 mg via SUBCUTANEOUS
  Filled 2016-08-24 (×2): qty 0.4

## 2016-08-24 MED ORDER — ENOXAPARIN SODIUM 100 MG/ML ~~LOC~~ SOLN: 1.0000 mg/kg | Freq: Two times a day (BID) | SUBCUTANEOUS | 0 refills | Status: AC

## 2016-08-24 MED ORDER — FENTANYL CITRATE (PF) 100 MCG/2ML IJ SOLN
50.0000 ug | Freq: Once | INTRAMUSCULAR | Status: AC
  Administered 2016-08-24: 50 ug via INTRAVENOUS
  Filled 2016-08-24: qty 2

## 2016-08-24 MED ORDER — WARFARIN SODIUM 2.5 MG PO TABS: 2.5000 mg | ORAL_TABLET | Freq: Every day | ORAL | 0 refills | Status: AC

## 2016-08-24 NOTE — Progress Notes (Addendum)
ANTICOAGULATION CONSULT NOTE - Initial Consult  Pharmacy Consult for lovenox Indication: DVT  Allergies  Allergen Reactions  . Adhesive [Tape] Other (See Comments)    Tears skin  . Amoxicillin Hives and Rash    Has patient had a PCN reaction causing immediate rash, facial/tongue/throat swelling, SOB or lightheadedness with hypotension: Yes Has patient had a PCN reaction causing severe rash involving mucus membranes or skin necrosis: Yes Has patient had a PCN reaction that required hospitalization No Has patient had a PCN reaction occurring within the last 10 years: No If all of the above answers are "NO", then may proceed with Cephalosporin use.   [name] Kitchen Hydrocodone Nausea Only    Patient Measurements: Height: 4\' 9"  (144.8 cm) IBW/kg (Calculated) : 38.6  Vital Signs: Temp: 98.8 F (37.1 C) (04/14 1219) Temp Source: Oral (04/14 1219) BP: 162/99 (04/14 1400) Pulse Rate: 98 (04/14 1400)  Labs:  Recent Labs  08/24/16 1315 08/24/16 1332  HGB 12.8 13.3  HCT 38.4 39.0  PLT 281  --   CREATININE  --  0.50    Estimated Creatinine Clearance: 30.2 mL/min (by C-G formula based on SCr of 0.5 mg/dL).   Medical History: Past Medical History:  Diagnosis Date  . Arthritis   . Cancer ([place])    skin/face  . Compression fracture of L1 lumbar vertebra (HCC)    L3 and L4  . Dementia   . DVT (deep venous thrombosis) ([place])   . Elbow fracture 12/04/2013   rt elbow  . Family history of anesthesia complication    Daughter is difficult to intubate   . GERD (gastroesophageal reflux disease)   . Macular degeneration   . PONV (postoperative nausea and vomiting)      Assessment: 83 yo female w/ L LE DVT (appears with history of DVT on coumadin in 2015) -lovenox 40mg  given about midnight in ED -SCr= 0.5, CrCl ~ 30, CBC stable  Goal of Therapy:  Monitor platelets by anticoagulation protocol: Yes   Plan:  -lovenox 40mg  [place] q24hr (next dose at 10pm) -CBC every 3 days -PT/INR in  am -Will follow anticoagulation plans  Hildred Laser, Pharm D 08/24/2016 2:20 PM   e

## 2016-08-24 NOTE — Progress Notes (Signed)
VASCULAR LAB PRELIMINARY  PRELIMINARY  PRELIMINARY  PRELIMINARY  Left lower extremity venous duplex completed.    Preliminary report:  There is acute DVT noted throughout the left posterior tibial and peroneal veins.   Richrd Kuzniar, RVT 08/24/2016, 11:51 AM

## 2016-08-24 NOTE — ED Triage Notes (Signed)
Pt here today for [place] with confirmed DVT in L leg, pt here yesterday for symptoms & pts daughter reports receiving Lovenox shot yesterday, pt has bil leg swellling, pt has L knee immobilizer present, denies SOB, pt hx of the same in 2015 requiring Coumadin at this time, A&O x4

## 2016-08-24 NOTE — Discharge Instructions (Signed)
You have been diagnosed with blood clot in your left leg.  Please inject Lovenox for the next 5-7 days.  Also start taking coumadin daily.  Take 2 pills (5mg ) daily for 2 days, then 1 pill daily thereafter.  You will need to have your INR check on Monday or Tuesday.  You will also need to follow up closely with your doctor next week for further  care.  Return if you have any concerns.

## 2016-08-24 NOTE — Discharge Instructions (Signed)
Continue taking your home medications as prescribed. You must wear your knee immobilizer at all times and remain non-weight bearing on your left leg until you follow up with Dr. Grandville Silos (ortho) on Monday for follow up.  Please return to the Emergency Department if symptoms worsen or new onset of fever, worsening swelling/redness, numbness, weakness, chest pain, difficulty breathing, syncope.   IMPORTANT PATIENT INSTRUCTIONS:  You have been scheduled for an Outpatient Vascular Study at [facility].    If tomorrow is a Saturday or Sunday, please go to the [facility] Emergency Department Registration Desk at 8 am tomorrow morning and tell them you are there for a vascular study.  If tomorrow is a weekday (Monday-Friday), please go to [facility] Admitting Department at 8 am and tell them you are  there for a vascular study.

## 2016-08-24 NOTE — ED Provider Notes (Signed)
[facility] DEPT Provider Note   CSN: [id number] Arrival date & time: 08/24/16  1206     History   Chief Complaint Chief Complaint  Patient presents with  . DVT    HPI Vanessa Pace is a 83 y.o. female.  HPI   83 year old female with history of multiple bony fracture from a fall 2 months ago presenting for a positive DVT study of her left lower extremities. Patient reportedly fell twice last week while trying to get off her recliner. Daughter noticed swelling throughout her left lower extremity for the past several days and patient was brought to the ER yesterday for evaluation of her condition. She had a DVT study ordered for today and DVT study is positive for acute DVT of the left posterior tibial and peroneal vein. She has had DVTs in the past, 2 years ago and was taking warfarin for 6 months. She currently denies any active fever, chills, chest pain, short of breath, back pain, leg numbness or weakness. Patient is hard of hearing, history obtained through daughter who is at bedside.   Past Medical History:  Diagnosis Date  . Arthritis   . Cancer ([place])    skin/face  . Compression fracture of L1 lumbar vertebra (HCC)    L3 and L4  . Dementia   . DVT (deep venous thrombosis) ([place])   . Elbow fracture 12/04/2013   rt elbow  . Family history of anesthesia complication    Daughter is difficult to intubate   . GERD (gastroesophageal reflux disease)   . Macular degeneration   . PONV (postoperative nausea and vomiting)     Patient Active Problem List   Diagnosis Date Noted  . Protein-calorie malnutrition, severe 06/18/2016  . Pelvic fracture ([place]) 06/13/2016  . Fall 12/09/2013  . Fracture of olecranon process of right ulna 12/08/2013  . Hip fracture ([place]) 12/04/2013  . Closed fracture of right ulna 12/04/2013  . Minor head injury without loss of consciousness 12/04/2013  . Leucocytosis 12/04/2013    Past Surgical History:  Procedure Laterality Date  . cataracts       B/L  . COLONOSCOPY W/ BIOPSIES AND POLYPECTOMY    . femur surgery    . FRACTURE SURGERY     right hip  . HIP SURGERY    . KYPHOPLASTY N/A 04/11/2016   Procedure: LUMBAR 1, LUMBAR 3, LUMBAR 4 KYPHOPLASTY;  Surgeon: Phylliss Bob, MD;  Location: [facility];  Service: Orthopedics;  Laterality: N/A;  LUMBAR1, LUMBAR 3, LUMBAR 4 KYPHOPLASTY  . ORIF ELBOW FRACTURE Right 12/06/2013   Procedure: OPEN REDUCTION INTERNAL FIXATION (ORIF) ELBOW/OLECRANON FRACTURE;  Surgeon: Jolyn Nap, MD;  Location: [facility];  Service: Orthopedics;  Laterality: Right;    OB History    No data available       Home Medications    Prior to Admission medications   Medication Sig Start Date End Date Taking? Authorizing Provider  acetaminophen (TYLENOL) 325 MG tablet Take 2 tablets (650 mg total) by mouth every 8 (eight) hours. Patient taking differently: Take 650 mg by mouth every 6 (six) hours as needed for mild pain.  06/19/16   Jill Alexanders, PA-C  DiphenhydrAMINE HCl, Sleep, (UNISOM SLEEPGELS) 50 MG CAPS Take 50 mg by mouth at bedtime.    Historical Provider, MD  Multiple Vitamins-Minerals (ICAPS AREDS 2 PO) Take 1 capsule by mouth 2 (two) times daily.     Historical Provider, MD    Family History Family History  Problem Relation Age  of Onset  . CAD Father   . CAD Son   . Cervical cancer Daughter     Social History Social History  Substance Use Topics  . Smoking status: Never Smoker  . Smokeless tobacco: Never Used     Comment: exposed to 2nd hand smoke   . Alcohol use Yes     Comment: occasionally     Allergies   Adhesive [tape]; Amoxicillin; and Hydrocodone   Review of Systems Review of Systems  All other systems reviewed and are negative.    Physical Exam Updated Vital Signs BP (!) 166/106 (BP Location: Right Arm)   Pulse 91   Temp 98.8 F (37.1 C) (Oral)   Resp 14   Ht 4\' 9"  (1.448 m)   SpO2 96%   Physical Exam  Constitutional: She appears well-developed and  well-nourished. No distress.  Frail-appearing elderly female laying in bed in no acute discomfort.  HENT:  Head: Atraumatic.  Eyes: Conjunctivae are normal.  Neck: Neck supple.  Cardiovascular: Normal rate and regular rhythm.   Murmur (4/6 systolic heart murmur best heard at the apex.) heard. Pulmonary/Chest: Effort normal and breath sounds normal.  Abdominal: Soft. She exhibits no distension. There is no tenderness.  Musculoskeletal: She exhibits tenderness (LLE: 2+ pedal edema throughout left lower leg extending towards the anterior tib-fib proximal to knee with mild generalized tenderness without any significant erythema or gross deformity. Intact dorsalis pedis pulse with brisk cap refill).  Neurological: She is alert.  Skin: No rash noted.  Psychiatric: She has a normal mood and affect.  Nursing note and vitals reviewed.    ED Treatments / Results  Labs (all labs ordered are listed, but only abnormal results are displayed) Labs Reviewed  CBC WITH DIFFERENTIAL/PLATELET - Abnormal; Notable for the following:       Result Value   WBC 12.1 (*)    Neutro Abs 9.4 (*)    Monocytes Absolute 1.3 (*)    All other components within normal limits  I-STAT CHEM 8, ED - Abnormal; Notable for the following:    Sodium 134 (*)    Chloride 100 (*)    Calcium, Ion 1.08 (*)    All other components within normal limits    EKG  EKG Interpretation None       Radiology Dg Knee Complete 4 Views Left  Result Date: 08/23/2016 CLINICAL DATA:  Recent fall with left lower leg pain, initial encounter EXAM: LEFT KNEE - COMPLETE 4+ VIEW COMPARISON:  06/13/2016 FINDINGS: Previously seen fractures in the proximal tibia and proximal fibula are again identified. Some impaction is noted at both fracture sites. Some mild posterior angulation of the distal tibia and fibula with respect to the proximal fracture fragments is noted. No joint effusion is seen. IMPRESSION: Previous proximal tibial and fibular  fractures with some impaction and mild angulation when compared with the prior exam. No definitive acute abnormality is seen. Electronically Signed   By: Inez Catalina M.D.   On: 08/23/2016 15:53   Dg Foot Complete Left  Result Date: 08/23/2016 CLINICAL DATA:  Recent fall with left foot pain, initial encounter EXAM: LEFT FOOT - COMPLETE 3+ VIEW COMPARISON:  None. FINDINGS: Mild irregularity is noted at the head of the fifth metatarsal which may represent an undisplaced fracture. Some degenerative changes in the second MTP joint are noted. Significant increased sclerosis is noted within the calcaneus on the lateral projection consistent with prior fracture and some callus formation. No new fracture is seen. IMPRESSION:  Changes of prior calcaneal fracture with some healing. Question of fifth distal metatarsal fracture. Electronically Signed   By: Inez Catalina M.D.   On: 08/23/2016 15:56    Procedures Procedures (including critical care time)  VASCULAR LAB PRELIMINARY  PRELIMINARY  PRELIMINARY  PRELIMINARY  Left lower extremity venous duplex completed.    Preliminary report:  There is acute DVT noted throughout the left posterior tibial and peroneal veins.   KANADY, CANDACE, RVT 08/24/2016, 11:51 AM   Medications Ordered in ED Medications  enoxaparin (LOVENOX) injection 40 mg (not administered)     Initial Impression / Assessment and Plan / ED Course  I have reviewed the triage vital signs and the nursing notes.  Pertinent labs & imaging results that were available during my care of the patient were reviewed by me and considered in my medical decision making (see chart for details).     BP (!) 157/105   Pulse 96   Temp 98.8 F (37.1 C) (Oral)   Resp 14   Ht 4\' 9"  (1.448 m)   SpO2 97%    Final Clinical Impressions(s) / ED Diagnoses   Final diagnoses:  Leg DVT (deep venous thromboembolism), acute, left (HCC)    New Prescriptions New Prescriptions   ENOXAPARIN  (LOVENOX) 100 MG/ML INJECTION    Inject 0.4 mLs (40 mg total) into the skin every 12 (twelve) hours.   WARFARIN (COUMADIN) 2.5 MG TABLET    Take 1 tablet (2.5 mg total) by mouth daily. Take 2 pills (5mg ) daily for the first 2 days, then 1 pill by mouth daily thereafter   1:48 PM Elderly female with history of prior DVT here with an acute onset of left lower extremity DVT. Vital signs stable, no chest pain or shortness of breath, she is neurovascularly intact. Her leg compartment is soft. Family member mentioned that patient cannot take Xarelto due to her increased age and low body weight according to pharmacy when she last had a DVT several years ago.  Appreciate consultation from our pharmacist who will evaluate pt and will help dosing lovenox/coumadin.  Care discussed with Dr. Rex Kras.    3:54 PM Pharmacist recommend 7 day course of Lovenox 40mg  injection once daily.  Pt should start coumadin at 5mg  x 2 days, then 2.5mg  thereafter.  She will need to have her INR check in 2-3 days.  She will also need to f/u closely with her PCP to determine the duration of her anticoagulation, including assessing risk/benefit.  Pt and daughter voice understanding and agrees with plan.     Domenic Moras, PA-C 08/24/16 [place], MD 08/25/16 1254

## 2016-08-28 DIAGNOSIS — S52572D Other intraarticular fracture of lower end of left radius, subsequent encounter for closed fracture with routine healing: Secondary | ICD-10-CM | POA: Diagnosis not present

## 2016-08-28 DIAGNOSIS — S82132D Displaced fracture of medial condyle of left tibia, subsequent encounter for closed fracture with routine healing: Secondary | ICD-10-CM | POA: Diagnosis not present

## 2016-08-28 DIAGNOSIS — S32502D Unspecified fracture of left pubis, subsequent encounter for fracture with routine healing: Secondary | ICD-10-CM | POA: Diagnosis not present

## 2016-08-29 DIAGNOSIS — Z7901 Long term (current) use of anticoagulants: Secondary | ICD-10-CM | POA: Diagnosis not present

## 2016-08-29 DIAGNOSIS — F0390 Unspecified dementia without behavioral disturbance: Secondary | ICD-10-CM | POA: Diagnosis not present

## 2016-08-29 DIAGNOSIS — S82225A Nondisplaced transverse fracture of shaft of left tibia, initial encounter for closed fracture: Secondary | ICD-10-CM | POA: Diagnosis not present

## 2016-08-29 DIAGNOSIS — M810 Age-related osteoporosis without current pathological fracture: Secondary | ICD-10-CM | POA: Diagnosis not present

## 2016-08-29 DIAGNOSIS — E559 Vitamin D deficiency, unspecified: Secondary | ICD-10-CM | POA: Diagnosis not present

## 2016-08-29 DIAGNOSIS — M8460XA Pathological fracture in other disease, unspecified site, initial encounter for fracture: Secondary | ICD-10-CM | POA: Diagnosis not present

## 2016-08-29 DIAGNOSIS — S32502A Unspecified fracture of left pubis, initial encounter for closed fracture: Secondary | ICD-10-CM | POA: Diagnosis not present

## 2016-08-29 DIAGNOSIS — E46 Unspecified protein-calorie malnutrition: Secondary | ICD-10-CM | POA: Diagnosis not present

## 2016-08-29 DIAGNOSIS — I82401 Acute embolism and thrombosis of unspecified deep veins of right lower extremity: Secondary | ICD-10-CM | POA: Diagnosis not present

## 2016-09-02 DIAGNOSIS — F0390 Unspecified dementia without behavioral disturbance: Secondary | ICD-10-CM | POA: Diagnosis not present

## 2016-09-02 DIAGNOSIS — I82401 Acute embolism and thrombosis of unspecified deep veins of right lower extremity: Secondary | ICD-10-CM | POA: Diagnosis not present

## 2016-09-02 DIAGNOSIS — M84454D Pathological fracture, pelvis, subsequent encounter for fracture with routine healing: Secondary | ICD-10-CM | POA: Diagnosis not present

## 2016-09-02 DIAGNOSIS — M80032D Age-related osteoporosis with current pathological fracture, left forearm, subsequent encounter for fracture with routine healing: Secondary | ICD-10-CM | POA: Diagnosis not present

## 2016-09-02 DIAGNOSIS — M80072D Age-related osteoporosis with current pathological fracture, left ankle and foot, subsequent encounter for fracture with routine healing: Secondary | ICD-10-CM | POA: Diagnosis not present

## 2016-09-02 DIAGNOSIS — M80062D Age-related osteoporosis with current pathological fracture, left lower leg, subsequent encounter for fracture with routine healing: Secondary | ICD-10-CM | POA: Diagnosis not present

## 2016-09-05 DIAGNOSIS — M80072D Age-related osteoporosis with current pathological fracture, left ankle and foot, subsequent encounter for fracture with routine healing: Secondary | ICD-10-CM | POA: Diagnosis not present

## 2016-09-05 DIAGNOSIS — I82401 Acute embolism and thrombosis of unspecified deep veins of right lower extremity: Secondary | ICD-10-CM | POA: Diagnosis not present

## 2016-09-05 DIAGNOSIS — M80062D Age-related osteoporosis with current pathological fracture, left lower leg, subsequent encounter for fracture with routine healing: Secondary | ICD-10-CM | POA: Diagnosis not present

## 2016-09-05 DIAGNOSIS — F0390 Unspecified dementia without behavioral disturbance: Secondary | ICD-10-CM | POA: Diagnosis not present

## 2016-09-05 DIAGNOSIS — M84454D Pathological fracture, pelvis, subsequent encounter for fracture with routine healing: Secondary | ICD-10-CM | POA: Diagnosis not present

## 2016-09-05 DIAGNOSIS — M80032D Age-related osteoporosis with current pathological fracture, left forearm, subsequent encounter for fracture with routine healing: Secondary | ICD-10-CM | POA: Diagnosis not present

## 2016-09-06 DIAGNOSIS — M80072D Age-related osteoporosis with current pathological fracture, left ankle and foot, subsequent encounter for fracture with routine healing: Secondary | ICD-10-CM | POA: Diagnosis not present

## 2016-09-06 DIAGNOSIS — I82401 Acute embolism and thrombosis of unspecified deep veins of right lower extremity: Secondary | ICD-10-CM | POA: Diagnosis not present

## 2016-09-06 DIAGNOSIS — M80032D Age-related osteoporosis with current pathological fracture, left forearm, subsequent encounter for fracture with routine healing: Secondary | ICD-10-CM | POA: Diagnosis not present

## 2016-09-06 DIAGNOSIS — M84454D Pathological fracture, pelvis, subsequent encounter for fracture with routine healing: Secondary | ICD-10-CM | POA: Diagnosis not present

## 2016-09-06 DIAGNOSIS — F0390 Unspecified dementia without behavioral disturbance: Secondary | ICD-10-CM | POA: Diagnosis not present

## 2016-09-06 DIAGNOSIS — M80062D Age-related osteoporosis with current pathological fracture, left lower leg, subsequent encounter for fracture with routine healing: Secondary | ICD-10-CM | POA: Diagnosis not present

## 2016-09-09 DIAGNOSIS — M80062D Age-related osteoporosis with current pathological fracture, left lower leg, subsequent encounter for fracture with routine healing: Secondary | ICD-10-CM | POA: Diagnosis not present

## 2016-09-09 DIAGNOSIS — M80032D Age-related osteoporosis with current pathological fracture, left forearm, subsequent encounter for fracture with routine healing: Secondary | ICD-10-CM | POA: Diagnosis not present

## 2016-09-09 DIAGNOSIS — M84454D Pathological fracture, pelvis, subsequent encounter for fracture with routine healing: Secondary | ICD-10-CM | POA: Diagnosis not present

## 2016-09-09 DIAGNOSIS — F0390 Unspecified dementia without behavioral disturbance: Secondary | ICD-10-CM | POA: Diagnosis not present

## 2016-09-09 DIAGNOSIS — I82401 Acute embolism and thrombosis of unspecified deep veins of right lower extremity: Secondary | ICD-10-CM | POA: Diagnosis not present

## 2016-09-09 DIAGNOSIS — M80072D Age-related osteoporosis with current pathological fracture, left ankle and foot, subsequent encounter for fracture with routine healing: Secondary | ICD-10-CM | POA: Diagnosis not present

## 2016-09-10 DIAGNOSIS — M80072D Age-related osteoporosis with current pathological fracture, left ankle and foot, subsequent encounter for fracture with routine healing: Secondary | ICD-10-CM | POA: Diagnosis not present

## 2016-09-10 DIAGNOSIS — F0390 Unspecified dementia without behavioral disturbance: Secondary | ICD-10-CM | POA: Diagnosis not present

## 2016-09-10 DIAGNOSIS — I82401 Acute embolism and thrombosis of unspecified deep veins of right lower extremity: Secondary | ICD-10-CM | POA: Diagnosis not present

## 2016-09-10 DIAGNOSIS — M84454D Pathological fracture, pelvis, subsequent encounter for fracture with routine healing: Secondary | ICD-10-CM | POA: Diagnosis not present

## 2016-09-10 DIAGNOSIS — M80062D Age-related osteoporosis with current pathological fracture, left lower leg, subsequent encounter for fracture with routine healing: Secondary | ICD-10-CM | POA: Diagnosis not present

## 2016-09-10 DIAGNOSIS — M80032D Age-related osteoporosis with current pathological fracture, left forearm, subsequent encounter for fracture with routine healing: Secondary | ICD-10-CM | POA: Diagnosis not present

## 2016-09-12 ENCOUNTER — Other Ambulatory Visit: Payer: Self-pay | Admitting: Family Medicine

## 2016-09-12 DIAGNOSIS — I82402 Acute embolism and thrombosis of unspecified deep veins of left lower extremity: Secondary | ICD-10-CM | POA: Diagnosis not present

## 2016-09-12 DIAGNOSIS — Z7901 Long term (current) use of anticoagulants: Secondary | ICD-10-CM | POA: Diagnosis not present

## 2016-09-17 DIAGNOSIS — M80032D Age-related osteoporosis with current pathological fracture, left forearm, subsequent encounter for fracture with routine healing: Secondary | ICD-10-CM | POA: Diagnosis not present

## 2016-09-17 DIAGNOSIS — M80062D Age-related osteoporosis with current pathological fracture, left lower leg, subsequent encounter for fracture with routine healing: Secondary | ICD-10-CM | POA: Diagnosis not present

## 2016-09-17 DIAGNOSIS — M80072D Age-related osteoporosis with current pathological fracture, left ankle and foot, subsequent encounter for fracture with routine healing: Secondary | ICD-10-CM | POA: Diagnosis not present

## 2016-09-17 DIAGNOSIS — F0390 Unspecified dementia without behavioral disturbance: Secondary | ICD-10-CM | POA: Diagnosis not present

## 2016-09-17 DIAGNOSIS — M84454D Pathological fracture, pelvis, subsequent encounter for fracture with routine healing: Secondary | ICD-10-CM | POA: Diagnosis not present

## 2016-09-17 DIAGNOSIS — I82401 Acute embolism and thrombosis of unspecified deep veins of right lower extremity: Secondary | ICD-10-CM | POA: Diagnosis not present

## 2016-09-18 DIAGNOSIS — M80032D Age-related osteoporosis with current pathological fracture, left forearm, subsequent encounter for fracture with routine healing: Secondary | ICD-10-CM | POA: Diagnosis not present

## 2016-09-18 DIAGNOSIS — M80072D Age-related osteoporosis with current pathological fracture, left ankle and foot, subsequent encounter for fracture with routine healing: Secondary | ICD-10-CM | POA: Diagnosis not present

## 2016-09-18 DIAGNOSIS — I82401 Acute embolism and thrombosis of unspecified deep veins of right lower extremity: Secondary | ICD-10-CM | POA: Diagnosis not present

## 2016-09-18 DIAGNOSIS — M84454D Pathological fracture, pelvis, subsequent encounter for fracture with routine healing: Secondary | ICD-10-CM | POA: Diagnosis not present

## 2016-09-18 DIAGNOSIS — M80062D Age-related osteoporosis with current pathological fracture, left lower leg, subsequent encounter for fracture with routine healing: Secondary | ICD-10-CM | POA: Diagnosis not present

## 2016-09-18 DIAGNOSIS — F0390 Unspecified dementia without behavioral disturbance: Secondary | ICD-10-CM | POA: Diagnosis not present

## 2016-09-20 ENCOUNTER — Ambulatory Visit
Admission: RE | Admit: 2016-09-20 | Discharge: 2016-09-20 | Disposition: A | Discharge status: Home / Self Care | Payer: Medicare Other | Source: Ambulatory Visit | Attending: Family Medicine | Admitting: Family Medicine

## 2016-09-20 DIAGNOSIS — I82402 Acute embolism and thrombosis of unspecified deep veins of left lower extremity: Secondary | ICD-10-CM

## 2016-09-25 DIAGNOSIS — M84454D Pathological fracture, pelvis, subsequent encounter for fracture with routine healing: Secondary | ICD-10-CM | POA: Diagnosis not present

## 2016-09-25 DIAGNOSIS — F0390 Unspecified dementia without behavioral disturbance: Secondary | ICD-10-CM | POA: Diagnosis not present

## 2016-09-25 DIAGNOSIS — M80062D Age-related osteoporosis with current pathological fracture, left lower leg, subsequent encounter for fracture with routine healing: Secondary | ICD-10-CM | POA: Diagnosis not present

## 2016-09-25 DIAGNOSIS — M80032D Age-related osteoporosis with current pathological fracture, left forearm, subsequent encounter for fracture with routine healing: Secondary | ICD-10-CM | POA: Diagnosis not present

## 2016-09-25 DIAGNOSIS — M80072D Age-related osteoporosis with current pathological fracture, left ankle and foot, subsequent encounter for fracture with routine healing: Secondary | ICD-10-CM | POA: Diagnosis not present

## 2016-09-25 DIAGNOSIS — I82401 Acute embolism and thrombosis of unspecified deep veins of right lower extremity: Secondary | ICD-10-CM | POA: Diagnosis not present

## 2016-09-27 DIAGNOSIS — F0390 Unspecified dementia without behavioral disturbance: Secondary | ICD-10-CM | POA: Diagnosis not present

## 2016-09-27 DIAGNOSIS — M84454D Pathological fracture, pelvis, subsequent encounter for fracture with routine healing: Secondary | ICD-10-CM | POA: Diagnosis not present

## 2016-09-27 DIAGNOSIS — M80062D Age-related osteoporosis with current pathological fracture, left lower leg, subsequent encounter for fracture with routine healing: Secondary | ICD-10-CM | POA: Diagnosis not present

## 2016-09-27 DIAGNOSIS — M80032D Age-related osteoporosis with current pathological fracture, left forearm, subsequent encounter for fracture with routine healing: Secondary | ICD-10-CM | POA: Diagnosis not present

## 2016-09-27 DIAGNOSIS — I82401 Acute embolism and thrombosis of unspecified deep veins of right lower extremity: Secondary | ICD-10-CM | POA: Diagnosis not present

## 2016-09-27 DIAGNOSIS — M80072D Age-related osteoporosis with current pathological fracture, left ankle and foot, subsequent encounter for fracture with routine healing: Secondary | ICD-10-CM | POA: Diagnosis not present

## 2016-10-01 DIAGNOSIS — F0390 Unspecified dementia without behavioral disturbance: Secondary | ICD-10-CM | POA: Diagnosis not present

## 2016-10-01 DIAGNOSIS — I82401 Acute embolism and thrombosis of unspecified deep veins of right lower extremity: Secondary | ICD-10-CM | POA: Diagnosis not present

## 2016-10-01 DIAGNOSIS — M84454D Pathological fracture, pelvis, subsequent encounter for fracture with routine healing: Secondary | ICD-10-CM | POA: Diagnosis not present

## 2016-10-01 DIAGNOSIS — M80032D Age-related osteoporosis with current pathological fracture, left forearm, subsequent encounter for fracture with routine healing: Secondary | ICD-10-CM | POA: Diagnosis not present

## 2016-10-01 DIAGNOSIS — M80062D Age-related osteoporosis with current pathological fracture, left lower leg, subsequent encounter for fracture with routine healing: Secondary | ICD-10-CM | POA: Diagnosis not present

## 2016-10-01 DIAGNOSIS — M80072D Age-related osteoporosis with current pathological fracture, left ankle and foot, subsequent encounter for fracture with routine healing: Secondary | ICD-10-CM | POA: Diagnosis not present

## 2016-10-03 DIAGNOSIS — M80072D Age-related osteoporosis with current pathological fracture, left ankle and foot, subsequent encounter for fracture with routine healing: Secondary | ICD-10-CM | POA: Diagnosis not present

## 2016-10-03 DIAGNOSIS — M80062D Age-related osteoporosis with current pathological fracture, left lower leg, subsequent encounter for fracture with routine healing: Secondary | ICD-10-CM | POA: Diagnosis not present

## 2016-10-03 DIAGNOSIS — M84454D Pathological fracture, pelvis, subsequent encounter for fracture with routine healing: Secondary | ICD-10-CM | POA: Diagnosis not present

## 2016-10-03 DIAGNOSIS — M80032D Age-related osteoporosis with current pathological fracture, left forearm, subsequent encounter for fracture with routine healing: Secondary | ICD-10-CM | POA: Diagnosis not present

## 2016-10-03 DIAGNOSIS — F0390 Unspecified dementia without behavioral disturbance: Secondary | ICD-10-CM | POA: Diagnosis not present

## 2016-10-03 DIAGNOSIS — I82401 Acute embolism and thrombosis of unspecified deep veins of right lower extremity: Secondary | ICD-10-CM | POA: Diagnosis not present

## 2016-10-09 DIAGNOSIS — M80062D Age-related osteoporosis with current pathological fracture, left lower leg, subsequent encounter for fracture with routine healing: Secondary | ICD-10-CM | POA: Diagnosis not present

## 2016-10-09 DIAGNOSIS — M84454D Pathological fracture, pelvis, subsequent encounter for fracture with routine healing: Secondary | ICD-10-CM | POA: Diagnosis not present

## 2016-10-09 DIAGNOSIS — M80072D Age-related osteoporosis with current pathological fracture, left ankle and foot, subsequent encounter for fracture with routine healing: Secondary | ICD-10-CM | POA: Diagnosis not present

## 2016-10-09 DIAGNOSIS — I82401 Acute embolism and thrombosis of unspecified deep veins of right lower extremity: Secondary | ICD-10-CM | POA: Diagnosis not present

## 2016-10-09 DIAGNOSIS — M80032D Age-related osteoporosis with current pathological fracture, left forearm, subsequent encounter for fracture with routine healing: Secondary | ICD-10-CM | POA: Diagnosis not present

## 2016-10-09 DIAGNOSIS — F0390 Unspecified dementia without behavioral disturbance: Secondary | ICD-10-CM | POA: Diagnosis not present

## 2016-10-11 DIAGNOSIS — I82401 Acute embolism and thrombosis of unspecified deep veins of right lower extremity: Secondary | ICD-10-CM | POA: Diagnosis not present

## 2016-10-11 DIAGNOSIS — M80032D Age-related osteoporosis with current pathological fracture, left forearm, subsequent encounter for fracture with routine healing: Secondary | ICD-10-CM | POA: Diagnosis not present

## 2016-10-11 DIAGNOSIS — F0390 Unspecified dementia without behavioral disturbance: Secondary | ICD-10-CM | POA: Diagnosis not present

## 2016-10-11 DIAGNOSIS — M80062D Age-related osteoporosis with current pathological fracture, left lower leg, subsequent encounter for fracture with routine healing: Secondary | ICD-10-CM | POA: Diagnosis not present

## 2016-10-11 DIAGNOSIS — M80072D Age-related osteoporosis with current pathological fracture, left ankle and foot, subsequent encounter for fracture with routine healing: Secondary | ICD-10-CM | POA: Diagnosis not present

## 2016-10-11 DIAGNOSIS — M84454D Pathological fracture, pelvis, subsequent encounter for fracture with routine healing: Secondary | ICD-10-CM | POA: Diagnosis not present

## 2016-10-14 DIAGNOSIS — M80062D Age-related osteoporosis with current pathological fracture, left lower leg, subsequent encounter for fracture with routine healing: Secondary | ICD-10-CM | POA: Diagnosis not present

## 2016-10-14 DIAGNOSIS — F0390 Unspecified dementia without behavioral disturbance: Secondary | ICD-10-CM | POA: Diagnosis not present

## 2016-10-14 DIAGNOSIS — M84454D Pathological fracture, pelvis, subsequent encounter for fracture with routine healing: Secondary | ICD-10-CM | POA: Diagnosis not present

## 2016-10-14 DIAGNOSIS — M80032D Age-related osteoporosis with current pathological fracture, left forearm, subsequent encounter for fracture with routine healing: Secondary | ICD-10-CM | POA: Diagnosis not present

## 2016-10-14 DIAGNOSIS — M80072D Age-related osteoporosis with current pathological fracture, left ankle and foot, subsequent encounter for fracture with routine healing: Secondary | ICD-10-CM | POA: Diagnosis not present

## 2016-10-14 DIAGNOSIS — I82401 Acute embolism and thrombosis of unspecified deep veins of right lower extremity: Secondary | ICD-10-CM | POA: Diagnosis not present

## 2016-10-16 DIAGNOSIS — I82401 Acute embolism and thrombosis of unspecified deep veins of right lower extremity: Secondary | ICD-10-CM | POA: Diagnosis not present

## 2016-10-16 DIAGNOSIS — M80062D Age-related osteoporosis with current pathological fracture, left lower leg, subsequent encounter for fracture with routine healing: Secondary | ICD-10-CM | POA: Diagnosis not present

## 2016-10-16 DIAGNOSIS — M80032D Age-related osteoporosis with current pathological fracture, left forearm, subsequent encounter for fracture with routine healing: Secondary | ICD-10-CM | POA: Diagnosis not present

## 2016-10-16 DIAGNOSIS — M84454D Pathological fracture, pelvis, subsequent encounter for fracture with routine healing: Secondary | ICD-10-CM | POA: Diagnosis not present

## 2016-10-16 DIAGNOSIS — F0390 Unspecified dementia without behavioral disturbance: Secondary | ICD-10-CM | POA: Diagnosis not present

## 2016-10-16 DIAGNOSIS — M80072D Age-related osteoporosis with current pathological fracture, left ankle and foot, subsequent encounter for fracture with routine healing: Secondary | ICD-10-CM | POA: Diagnosis not present

## 2016-10-22 DIAGNOSIS — S92911A Unspecified fracture of right toe(s), initial encounter for closed fracture: Secondary | ICD-10-CM | POA: Diagnosis not present

## 2016-10-22 DIAGNOSIS — R030 Elevated blood-pressure reading, without diagnosis of hypertension: Secondary | ICD-10-CM | POA: Diagnosis not present

## 2016-10-22 DIAGNOSIS — E46 Unspecified protein-calorie malnutrition: Secondary | ICD-10-CM | POA: Diagnosis not present

## 2016-10-23 DIAGNOSIS — M80062D Age-related osteoporosis with current pathological fracture, left lower leg, subsequent encounter for fracture with routine healing: Secondary | ICD-10-CM | POA: Diagnosis not present

## 2016-10-23 DIAGNOSIS — M80072D Age-related osteoporosis with current pathological fracture, left ankle and foot, subsequent encounter for fracture with routine healing: Secondary | ICD-10-CM | POA: Diagnosis not present

## 2016-10-23 DIAGNOSIS — I82401 Acute embolism and thrombosis of unspecified deep veins of right lower extremity: Secondary | ICD-10-CM | POA: Diagnosis not present

## 2016-10-23 DIAGNOSIS — M80032D Age-related osteoporosis with current pathological fracture, left forearm, subsequent encounter for fracture with routine healing: Secondary | ICD-10-CM | POA: Diagnosis not present

## 2016-10-23 DIAGNOSIS — F0390 Unspecified dementia without behavioral disturbance: Secondary | ICD-10-CM | POA: Diagnosis not present

## 2016-10-23 DIAGNOSIS — M84454D Pathological fracture, pelvis, subsequent encounter for fracture with routine healing: Secondary | ICD-10-CM | POA: Diagnosis not present

## 2016-10-28 DIAGNOSIS — F0390 Unspecified dementia without behavioral disturbance: Secondary | ICD-10-CM | POA: Diagnosis not present

## 2016-10-28 DIAGNOSIS — M80072D Age-related osteoporosis with current pathological fracture, left ankle and foot, subsequent encounter for fracture with routine healing: Secondary | ICD-10-CM | POA: Diagnosis not present

## 2016-10-28 DIAGNOSIS — M80062D Age-related osteoporosis with current pathological fracture, left lower leg, subsequent encounter for fracture with routine healing: Secondary | ICD-10-CM | POA: Diagnosis not present

## 2016-10-28 DIAGNOSIS — M80032D Age-related osteoporosis with current pathological fracture, left forearm, subsequent encounter for fracture with routine healing: Secondary | ICD-10-CM | POA: Diagnosis not present

## 2016-10-28 DIAGNOSIS — I82401 Acute embolism and thrombosis of unspecified deep veins of right lower extremity: Secondary | ICD-10-CM | POA: Diagnosis not present

## 2016-10-28 DIAGNOSIS — M84454D Pathological fracture, pelvis, subsequent encounter for fracture with routine healing: Secondary | ICD-10-CM | POA: Diagnosis not present

## 2016-10-30 DIAGNOSIS — M80032D Age-related osteoporosis with current pathological fracture, left forearm, subsequent encounter for fracture with routine healing: Secondary | ICD-10-CM | POA: Diagnosis not present

## 2016-10-30 DIAGNOSIS — M84454D Pathological fracture, pelvis, subsequent encounter for fracture with routine healing: Secondary | ICD-10-CM | POA: Diagnosis not present

## 2016-10-30 DIAGNOSIS — I82401 Acute embolism and thrombosis of unspecified deep veins of right lower extremity: Secondary | ICD-10-CM | POA: Diagnosis not present

## 2016-10-30 DIAGNOSIS — M80072D Age-related osteoporosis with current pathological fracture, left ankle and foot, subsequent encounter for fracture with routine healing: Secondary | ICD-10-CM | POA: Diagnosis not present

## 2016-10-30 DIAGNOSIS — M80062D Age-related osteoporosis with current pathological fracture, left lower leg, subsequent encounter for fracture with routine healing: Secondary | ICD-10-CM | POA: Diagnosis not present

## 2016-10-30 DIAGNOSIS — F0390 Unspecified dementia without behavioral disturbance: Secondary | ICD-10-CM | POA: Diagnosis not present

## 2017-02-04 ENCOUNTER — Encounter (HOSPITAL_COMMUNITY): Payer: Self-pay

## 2017-02-04 ENCOUNTER — Inpatient Hospital Stay (HOSPITAL_COMMUNITY)
Admission: EM | Admit: 2017-02-04 | Discharge: 2017-02-10 | DRG: 871 | Disposition: E | Payer: Medicare Other | Attending: Emergency Medicine | Admitting: Emergency Medicine

## 2017-02-04 ENCOUNTER — Emergency Department (HOSPITAL_COMMUNITY): Payer: Medicare Other

## 2017-02-04 DIAGNOSIS — Z681 Body mass index (BMI) 19 or less, adult: Secondary | ICD-10-CM

## 2017-02-04 DIAGNOSIS — Z8249 Family history of ischemic heart disease and other diseases of the circulatory system: Secondary | ICD-10-CM | POA: Diagnosis not present

## 2017-02-04 DIAGNOSIS — J9601 Acute respiratory failure with hypoxia: Secondary | ICD-10-CM | POA: Diagnosis not present

## 2017-02-04 DIAGNOSIS — J9690 Respiratory failure, unspecified, unspecified whether with hypoxia or hypercapnia: Secondary | ICD-10-CM

## 2017-02-04 DIAGNOSIS — Z885 Allergy status to narcotic agent status: Secondary | ICD-10-CM | POA: Diagnosis not present

## 2017-02-04 DIAGNOSIS — R570 Cardiogenic shock: Secondary | ICD-10-CM | POA: Diagnosis present

## 2017-02-04 DIAGNOSIS — Z91048 Other nonmedicinal substance allergy status: Secondary | ICD-10-CM | POA: Diagnosis not present

## 2017-02-04 DIAGNOSIS — A419 Sepsis, unspecified organism: Secondary | ICD-10-CM | POA: Diagnosis not present

## 2017-02-04 DIAGNOSIS — I429 Cardiomyopathy, unspecified: Secondary | ICD-10-CM | POA: Diagnosis present

## 2017-02-04 DIAGNOSIS — Z515 Encounter for palliative care: Secondary | ICD-10-CM | POA: Diagnosis present

## 2017-02-04 DIAGNOSIS — T68XXXA Hypothermia, initial encounter: Secondary | ICD-10-CM | POA: Diagnosis present

## 2017-02-04 DIAGNOSIS — K219 Gastro-esophageal reflux disease without esophagitis: Secondary | ICD-10-CM | POA: Diagnosis present

## 2017-02-04 DIAGNOSIS — N179 Acute kidney failure, unspecified: Secondary | ICD-10-CM | POA: Diagnosis present

## 2017-02-04 DIAGNOSIS — Z79899 Other long term (current) drug therapy: Secondary | ICD-10-CM | POA: Diagnosis not present

## 2017-02-04 DIAGNOSIS — E872 Acidosis: Secondary | ICD-10-CM | POA: Diagnosis not present

## 2017-02-04 DIAGNOSIS — Z880 Allergy status to penicillin: Secondary | ICD-10-CM

## 2017-02-04 DIAGNOSIS — F0390 Unspecified dementia without behavioral disturbance: Secondary | ICD-10-CM | POA: Diagnosis present

## 2017-02-04 DIAGNOSIS — E43 Unspecified severe protein-calorie malnutrition: Secondary | ICD-10-CM | POA: Diagnosis present

## 2017-02-04 DIAGNOSIS — L8990 Pressure ulcer of unspecified site, unspecified stage: Secondary | ICD-10-CM | POA: Insufficient documentation

## 2017-02-04 DIAGNOSIS — I959 Hypotension, unspecified: Secondary | ICD-10-CM | POA: Diagnosis not present

## 2017-02-04 DIAGNOSIS — I21A1 Myocardial infarction type 2: Secondary | ICD-10-CM | POA: Diagnosis present

## 2017-02-04 DIAGNOSIS — Z86718 Personal history of other venous thrombosis and embolism: Secondary | ICD-10-CM | POA: Diagnosis not present

## 2017-02-04 DIAGNOSIS — Z7901 Long term (current) use of anticoagulants: Secondary | ICD-10-CM

## 2017-02-04 DIAGNOSIS — G9341 Metabolic encephalopathy: Secondary | ICD-10-CM | POA: Diagnosis present

## 2017-02-04 DIAGNOSIS — R6521 Severe sepsis with septic shock: Secondary | ICD-10-CM | POA: Diagnosis present

## 2017-02-04 DIAGNOSIS — E871 Hypo-osmolality and hyponatremia: Secondary | ICD-10-CM | POA: Diagnosis present

## 2017-02-04 DIAGNOSIS — R739 Hyperglycemia, unspecified: Secondary | ICD-10-CM | POA: Diagnosis present

## 2017-02-04 DIAGNOSIS — J690 Pneumonitis due to inhalation of food and vomit: Secondary | ICD-10-CM | POA: Diagnosis present

## 2017-02-04 DIAGNOSIS — R011 Cardiac murmur, unspecified: Secondary | ICD-10-CM | POA: Diagnosis present

## 2017-02-04 DIAGNOSIS — R1310 Dysphagia, unspecified: Secondary | ICD-10-CM | POA: Diagnosis present

## 2017-02-04 DIAGNOSIS — J984 Other disorders of lung: Secondary | ICD-10-CM | POA: Diagnosis not present

## 2017-02-04 DIAGNOSIS — I340 Nonrheumatic mitral (valve) insufficiency: Secondary | ICD-10-CM | POA: Diagnosis not present

## 2017-02-04 DIAGNOSIS — R296 Repeated falls: Secondary | ICD-10-CM | POA: Diagnosis present

## 2017-02-04 DIAGNOSIS — R0603 Acute respiratory distress: Secondary | ICD-10-CM | POA: Diagnosis not present

## 2017-02-04 DIAGNOSIS — Z66 Do not resuscitate: Secondary | ICD-10-CM | POA: Diagnosis present

## 2017-02-04 DIAGNOSIS — R451 Restlessness and agitation: Secondary | ICD-10-CM | POA: Diagnosis present

## 2017-02-04 DIAGNOSIS — I10 Essential (primary) hypertension: Secondary | ICD-10-CM | POA: Diagnosis present

## 2017-02-04 DIAGNOSIS — I080 Rheumatic disorders of both mitral and aortic valves: Secondary | ICD-10-CM | POA: Diagnosis present

## 2017-02-04 HISTORY — DX: Nonrheumatic mitral (valve) insufficiency: I34.0

## 2017-02-04 HISTORY — DX: Nonrheumatic aortic (valve) stenosis: I35.0

## 2017-02-04 LAB — CBC
HEMATOCRIT: 42 % (ref 36.0–46.0)
HEMOGLOBIN: 13.8 g/dL (ref 12.0–15.0)
MCH: 29.4 pg (ref 26.0–34.0)
MCHC: 32.9 g/dL (ref 30.0–36.0)
MCV: 89.6 fL (ref 78.0–100.0)
Platelets: 160 10*3/uL (ref 150–400)
RBC: 4.69 MIL/uL (ref 3.87–5.11)
RDW: 13.3 % (ref 11.5–15.5)
WBC: 7.5 10*3/uL (ref 4.0–10.5)

## 2017-02-04 LAB — I-STAT ARTERIAL BLOOD GAS, ED
Acid-base deficit: 7 mmol/L — ABNORMAL HIGH (ref 0.0–2.0)
Bicarbonate: 20.4 mmol/L (ref 20.0–28.0)
O2 SAT: 99 %
TCO2: 22 mmol/L (ref 22–32)
pCO2 arterial: 47.1 mmHg (ref 32.0–48.0)
pH, Arterial: 7.236 — ABNORMAL LOW (ref 7.350–7.450)
pO2, Arterial: 171 mmHg — ABNORMAL HIGH (ref 83.0–108.0)

## 2017-02-04 LAB — I-STAT TROPONIN, ED: Troponin i, poc: 0.09 ng/mL (ref 0.00–0.08)

## 2017-02-04 LAB — BRAIN NATRIURETIC PEPTIDE: B NATRIURETIC PEPTIDE 5: 596.3 pg/mL — AB (ref 0.0–100.0)

## 2017-02-04 LAB — I-STAT CG4 LACTIC ACID, ED: LACTIC ACID, VENOUS: 6.02 mmol/L — AB (ref 0.5–1.9)

## 2017-02-04 LAB — BASIC METABOLIC PANEL
Anion gap: 10 (ref 5–15)
BUN: 14 mg/dL (ref 6–20)
CALCIUM: 7.9 mg/dL — AB (ref 8.9–10.3)
CO2: 17 mmol/L — ABNORMAL LOW (ref 22–32)
Chloride: 102 mmol/L (ref 101–111)
Creatinine, Ser: 0.98 mg/dL (ref 0.44–1.00)
GFR calc Af Amer: 58 mL/min — ABNORMAL LOW (ref >60)
GFR, EST NON AFRICAN AMERICAN: 50 mL/min — AB (ref >60)
GLUCOSE: 205 mg/dL — AB (ref 65–99)
Potassium: 3.7 mmol/L (ref 3.5–5.1)
Sodium: 129 mmol/L — ABNORMAL LOW (ref 135–145)

## 2017-02-04 LAB — PROTIME-INR
INR: 1.16
Prothrombin Time: 14.7 seconds (ref 11.4–15.2)

## 2017-02-04 MED ORDER — DEXTROSE 5 % IV SOLN
500.0000 mg | INTRAVENOUS | Status: DC
  Administered 2017-02-05 – 2017-02-07 (×3): 500 mg via INTRAVENOUS
  Filled 2017-02-04 (×3): qty 500

## 2017-02-04 MED ORDER — DEXTROSE 5 % IV SOLN
1.0000 g | INTRAVENOUS | Status: DC
  Administered 2017-02-05 – 2017-02-07 (×3): 1 g via INTRAVENOUS
  Filled 2017-02-04 (×3): qty 10

## 2017-02-04 MED ORDER — SUCCINYLCHOLINE CHLORIDE 20 MG/ML IJ SOLN
100.0000 mg | Freq: Once | INTRAMUSCULAR | Status: DC
  Filled 2017-02-04: qty 5

## 2017-02-04 MED ORDER — FENTANYL CITRATE (PF) 100 MCG/2ML IJ SOLN: 50.0000 ug | INTRAMUSCULAR | Status: DC | PRN

## 2017-02-04 MED ORDER — MIDAZOLAM HCL 2 MG/2ML IJ SOLN
INTRAMUSCULAR | Status: AC
  Filled 2017-02-04: qty 2

## 2017-02-04 MED ORDER — SODIUM CHLORIDE 0.9 % IV SOLN
250.0000 mL | INTRAVENOUS | Status: DC | PRN
  Administered 2017-02-05: 250 mL via INTRAVENOUS

## 2017-02-04 MED ORDER — DEXTROSE 5 % IV SOLN
1.0000 g | Freq: Once | INTRAVENOUS | Status: AC
  Administered 2017-02-04: 1 g via INTRAVENOUS
  Filled 2017-02-04: qty 10

## 2017-02-04 MED ORDER — SODIUM CHLORIDE 0.9 % IV BOLUS (SEPSIS)
1000.0000 mL | Freq: Once | INTRAVENOUS | Status: AC
Start: 2017-02-04 — End: 2017-02-04
  Administered 2017-02-04: 1000 mL via INTRAVENOUS

## 2017-02-04 MED ORDER — HEPARIN BOLUS VIA INFUSION
1000.0000 [IU] | Freq: Once | INTRAVENOUS | Status: AC
  Administered 2017-02-05: 1000 [IU] via INTRAVENOUS
  Filled 2017-02-04: qty 1000

## 2017-02-04 MED ORDER — SODIUM CHLORIDE 0.9 % IV BOLUS (SEPSIS)
1000.0000 mL | Freq: Once | INTRAVENOUS | Status: AC
  Administered 2017-02-04: 1000 mL via INTRAVENOUS

## 2017-02-04 MED ORDER — PANTOPRAZOLE SODIUM 40 MG IV SOLR
40.0000 mg | Freq: Every day | INTRAVENOUS | Status: DC
  Administered 2017-02-05 – 2017-02-07 (×4): 40 mg via INTRAVENOUS
  Filled 2017-02-04 (×4): qty 40

## 2017-02-04 MED ORDER — ETOMIDATE 2 MG/ML IV SOLN
20.0000 mg | Freq: Once | INTRAVENOUS | Status: AC
  Administered 2017-02-04: 20 mg via INTRAVENOUS

## 2017-02-04 MED ORDER — DEXTROSE 5 % IV SOLN
500.0000 mg | Freq: Once | INTRAVENOUS | Status: AC
  Administered 2017-02-04: 500 mg via INTRAVENOUS
  Filled 2017-02-04: qty 500

## 2017-02-04 MED ORDER — SODIUM CHLORIDE 0.9 % IV BOLUS (SEPSIS)
250.0000 mL | Freq: Once | INTRAVENOUS | Status: AC
  Administered 2017-02-04: 250 mL via INTRAVENOUS

## 2017-02-04 MED ORDER — MIDAZOLAM HCL 2 MG/2ML IJ SOLN: 2.0000 mg | Freq: Once | INTRAMUSCULAR | Status: DC

## 2017-02-04 MED ORDER — IOPAMIDOL (ISOVUE-370) INJECTION 76%
INTRAVENOUS | Status: AC
  Filled 2017-02-04: qty 100

## 2017-02-04 MED ORDER — FENTANYL CITRATE (PF) 100 MCG/2ML IJ SOLN
50.0000 ug | Freq: Once | INTRAMUSCULAR | Status: AC
  Administered 2017-02-04: 50 ug via INTRAVENOUS

## 2017-02-04 MED ORDER — FENTANYL CITRATE (PF) 100 MCG/2ML IJ SOLN
INTRAMUSCULAR | Status: AC
  Filled 2017-02-04: qty 2

## 2017-02-04 MED ORDER — FENTANYL CITRATE (PF) 100 MCG/2ML IJ SOLN
12.5000 ug | INTRAMUSCULAR | Status: DC | PRN
  Administered 2017-02-04: 25 ug via INTRAVENOUS
  Administered 2017-02-06: 12.5 ug via INTRAVENOUS
  Administered 2017-02-07: 25 ug via INTRAVENOUS

## 2017-02-04 MED ORDER — IPRATROPIUM-ALBUTEROL 0.5-2.5 (3) MG/3ML IN SOLN
3.0000 mL | Freq: Four times a day (QID) | RESPIRATORY_TRACT | Status: DC
  Administered 2017-02-05 – 2017-02-08 (×12): 3 mL via RESPIRATORY_TRACT
  Filled 2017-02-04 (×12): qty 3

## 2017-02-04 MED ORDER — FENTANYL CITRATE (PF) 100 MCG/2ML IJ SOLN
50.0000 ug | INTRAMUSCULAR | Status: AC | PRN
  Administered 2017-02-04 – 2017-02-06 (×3): 50 ug via INTRAVENOUS
  Filled 2017-02-04: qty 2

## 2017-02-04 MED ORDER — HEPARIN (PORCINE) IN NACL 100-0.45 UNIT/ML-% IJ SOLN
800.0000 [IU]/h | INTRAMUSCULAR | Status: DC
  Administered 2017-02-05: 600 [IU]/h via INTRAVENOUS
  Administered 2017-02-06: 800 [IU]/h via INTRAVENOUS
  Filled 2017-02-04 (×4): qty 250

## 2017-02-04 MED ORDER — FENTANYL CITRATE (PF) 100 MCG/2ML IJ SOLN
INTRAMUSCULAR | Status: AC
  Administered 2017-02-04: 50 ug via INTRAVENOUS
  Filled 2017-02-04: qty 2

## 2017-02-04 NOTE — Progress Notes (Signed)
ANTICOAGULATION CONSULT NOTE - Initial Consult  Pharmacy Consult for heparin Indication: h/o recurrent DVT  Allergies  Allergen Reactions  . Adhesive [Tape] Other (See Comments)    TEARS THE SKIN; PLEASE USE COBAN WRAP!!!!  . Amoxicillin Hives and Rash    Has patient had a PCN reaction causing immediate rash, facial/tongue/throat swelling, SOB or lightheadedness with hypotension: Yes Has patient had a PCN reaction causing severe rash involving mucus membranes or skin necrosis: Yes Has patient had a PCN reaction that required hospitalization No Has patient had a PCN reaction occurring within the last 10 years: No If all of the above answers are "NO", then may proceed with Cephalosporin use.   [name] Kitchen Hydrocodone Nausea Only    Patient Measurements: Height: 5' (152.4 cm) Weight: 100 lb (45.4 kg) IBW/kg (Calculated) : 45.5  Vital Signs: Temp: 95.9 F (35.5 C) (09/25 2141) Temp Source: Rectal (09/25 2141) BP: 94/67 (09/25 2230) Pulse Rate: 93 (09/25 2230)  Labs:  Recent Labs  01/24/2017 2121 02/03/2017 2204  HGB 13.8  --   HCT 42.0  --   PLT 160  --   LABPROT  --  14.7  INR  --  1.16     Medical History: Past Medical History:  Diagnosis Date  . Arthritis   . Cancer ([place])    skin/face  . Compression fracture of L1 lumbar vertebra (HCC)    L3 and L4  . Dementia   . DVT (deep venous thrombosis) ([place])   . Elbow fracture 12/04/2013   rt elbow  . Family history of anesthesia complication    Daughter is difficult to intubate   . GERD (gastroesophageal reflux disease)   . Macular degeneration   . PONV (postoperative nausea and vomiting)     Assessment: 83yo female presents w/ SOB/wheeze, admitted to CCM service, to begin heparin gtt for h/o recurrent DVT; pt states she takes Coumadin but INR close to baseline.  Goal of Therapy:  Heparin level 0.3-0.7 units/ml Monitor platelets by anticoagulation protocol: Yes   Plan:  Will give heparin 1000 units IV bolus x1 followed  by gtt at 600 units/hr and monitor heparin levels and CBC.  Wynona Neat, PharmD, BCPS  01/21/2017,10:51 PM

## 2017-02-04 NOTE — ED Provider Notes (Signed)
[facility] DEPT Provider Note   CSN: [id number] Arrival date & time: 02/06/2017  2110     History   Chief Complaint Chief Complaint  Patient presents with  . Respiratory Distress    HPI Vanessa Pace is a 83 y.o. female.  Patient is an 83 year old female with a history of malnutrition prior DVT several years ago but not anticoagulated currently but despite age otherwise relatively healthy presenting today with acute onset of shortness of breath. Family reports that patient seemed her normal self throughout the day she has had an occasional cough but nothing ongoing.  This evening around 7 or 8 PM she suddenly developed severe shortness of breath. Family denies her complaining of chest pain but they did state last week she noted some swelling and redness to her right foot. She has not had fever but family say that she was cold and chilled today.  When EMS arrived patient was hypoxic and hypertensive with coarse breath sounds. She was started on CPAP and given nitroglycerin. However her symptoms minimally improved. Upon arrival here patient was in respiratory distress. She did indicate that she would once intubation if necessary and that she was a full code. She denied chest or abdominal pain.   The history is provided by the EMS personnel and a relative. The history is limited by the condition of the patient.    Past Medical History:  Diagnosis Date  . Arthritis   . Cancer ([place])    skin/face  . Compression fracture of L1 lumbar vertebra (HCC)    L3 and L4  . Dementia   . DVT (deep venous thrombosis) ([place])   . Elbow fracture 12/04/2013   rt elbow  . Family history of anesthesia complication    Daughter is difficult to intubate   . GERD (gastroesophageal reflux disease)   . Macular degeneration   . PONV (postoperative nausea and vomiting)     Patient Active Problem List   Diagnosis Date Noted  . Acute respiratory failure with hypoxia ([place]) 01/21/2017  . Protein-calorie  malnutrition, severe 06/18/2016  . Pelvic fracture ([place]) 06/13/2016  . Fall 12/09/2013  . Fracture of olecranon process of right ulna 12/08/2013  . Hip fracture ([place]) 12/04/2013  . Closed fracture of right ulna 12/04/2013  . Minor head injury without loss of consciousness 12/04/2013  . Leucocytosis 12/04/2013    Past Surgical History:  Procedure Laterality Date  . cataracts     B/L  . COLONOSCOPY W/ BIOPSIES AND POLYPECTOMY    . femur surgery    . FRACTURE SURGERY     right hip  . HIP SURGERY    . KYPHOPLASTY N/A 04/11/2016   Procedure: LUMBAR 1, LUMBAR 3, LUMBAR 4 KYPHOPLASTY;  Surgeon: Phylliss Bob, MD;  Location: [facility];  Service: Orthopedics;  Laterality: N/A;  LUMBAR1, LUMBAR 3, LUMBAR 4 KYPHOPLASTY  . ORIF ELBOW FRACTURE Right 12/06/2013   Procedure: OPEN REDUCTION INTERNAL FIXATION (ORIF) ELBOW/OLECRANON FRACTURE;  Surgeon: Jolyn Nap, MD;  Location: [facility];  Service: Orthopedics;  Laterality: Right;    OB History    No data available       Home Medications    Prior to Admission medications   Medication Sig Start Date End Date Taking? Authorizing Provider  acetaminophen (TYLENOL) 325 MG tablet Take 2 tablets (650 mg total) by mouth every 8 (eight) hours. Patient taking differently: Take 650 mg by mouth every 6 (six) hours as needed for mild pain.  06/19/16   Obie Dredge  S, PA-C  DiphenhydrAMINE HCl, Sleep, (UNISOM SLEEPGELS) 50 MG CAPS Take 50 mg by mouth at bedtime.    [provider]  enoxaparin (LOVENOX) 100 MG/ML injection Inject 0.4 mLs (40 mg total) into the skin every 12 (twelve) hours. 08/24/16   Domenic Moras, PA-C  Multiple Vitamins-Minerals (ICAPS AREDS 2 PO) Take 1 capsule by mouth 2 (two) times daily.     [provider]  warfarin (COUMADIN) 2.5 MG tablet Take 1 tablet (2.5 mg total) by mouth daily. Take 2 pills (5mg ) daily for the first 2 days, then 1 pill by mouth daily thereafter 08/24/16   Domenic Moras, PA-C    Family  History Family History  Problem Relation Age of Onset  . CAD Father   . CAD Son   . Cervical cancer Daughter     Social History Social History  Substance Use Topics  . Smoking status: Never Smoker  . Smokeless tobacco: Never Used     Comment: exposed to 2nd hand smoke   . Alcohol use Yes     Comment: occasionally     Allergies   Adhesive [tape]; Amoxicillin; and Hydrocodone   Review of Systems Review of Systems  Unable to perform ROS: Acuity of condition     Physical Exam Updated Vital Signs BP (!) 83/59   Pulse 94   Temp (S) (!) 95.9 F (35.5 C) (Rectal) Comment: Bear Hugger applied   Resp 16   Ht 5' (1.524 m)   Wt 45.4 kg (100 lb)   SpO2 99%   BMI 19.53 kg/m   Physical Exam  Constitutional: She appears well-developed. She appears cachectic. She appears distressed.  Could not but unable to speak more than a word or 2  HENT:  Head: Normocephalic and atraumatic.  Mouth/Throat: Mucous membranes are dry.  Eyes: Pupils are equal, round, and reactive to light. Conjunctivae and EOM are normal.  Neck: Normal range of motion. Neck supple.  Cardiovascular: Regular rhythm and intact distal pulses.  Tachycardia present.   No murmur heard. Pulmonary/Chest: Tachypnea noted. She is in respiratory distress. She has no wheezes. She has rales.  Abdominal: Soft. She exhibits no distension. There is no tenderness. There is no rebound and no guarding.  Musculoskeletal: Normal range of motion. She exhibits no edema or tenderness.  Neurological:  Starting to get tired but still will open eyes to voice  Skin: Skin is warm and dry. No rash noted. No erythema.  Psychiatric: She has a normal mood and affect. Her behavior is normal.  Nursing note and vitals reviewed.    ED Treatments / Results  Labs (all labs ordered are listed, but only abnormal results are displayed) Labs Reviewed  BRAIN NATRIURETIC PEPTIDE - Abnormal; Notable for the following:       Result Value   B  Natriuretic Peptide 596.3 (*)    All other components within normal limits  BASIC METABOLIC PANEL - Abnormal; Notable for the following:    Sodium 129 (*)    CO2 17 (*)    Glucose, Bld 205 (*)    Calcium 7.9 (*)    GFR calc non Af Amer 50 (*)    GFR calc Af Amer 58 (*)    All other components within normal limits  I-STAT TROPONIN, ED - Abnormal; Notable for the following:    Troponin i, poc 0.09 (*)    All other components within normal limits  I-STAT CG4 LACTIC ACID, ED - Abnormal; Notable for the following:  Lactic Acid, Venous 6.02 (*)    All other components within normal limits  I-STAT ARTERIAL BLOOD GAS, ED - Abnormal; Notable for the following:    pH, Arterial 7.236 (*)    pO2, Arterial 171.0 (*)    Acid-base deficit 7.0 (*)    All other components within normal limits  CULTURE, BLOOD (ROUTINE X 2)  CULTURE, BLOOD (ROUTINE X 2)  CULTURE, RESPIRATORY (NON-EXPECTORATED)  CBC  PROTIME-INR  URINALYSIS, ROUTINE W REFLEX MICROSCOPIC  PROCALCITONIN  PROCALCITONIN  LACTIC ACID, PLASMA  LACTIC ACID, PLASMA  STREP PNEUMONIAE URINARY ANTIGEN  LEGIONELLA PNEUMOPHILA SEROGP 1 UR AG  BASIC METABOLIC PANEL  MAGNESIUM  PHOSPHORUS  TROPONIN I  TROPONIN I  CORTISOL  RAPID URINE DRUG SCREEN, HOSP PERFORMED  HEPARIN LEVEL (UNFRACTIONATED)  CBC  D-DIMER, QUANTITATIVE (NOT AT [facility])  BLOOD GAS, ARTERIAL    EKG  EKG Interpretation  Date/Time:  Tuesday February 04 2017 21:15:48 EDT Ventricular Rate:  110 PR Interval:    QRS Duration: 95 QT Interval:  323 QTC Calculation: 437 R Axis:   42 Text Interpretation:  Sinus tachycardia Probable left atrial enlargement Nonspecific repol abnormality, diffuse leads No significant change since last tracing Confirmed by Blanchie Dessert [id number]) on 01/27/2017 9:50:03 PM       Radiology Dg Chest Portable 1 View  Result Date: 01/16/2017 CLINICAL DATA:  Intubated EXAM: PORTABLE CHEST 1 VIEW COMPARISON:  04/25/2016, radiograph  04/11/2016 FINDINGS: Endotracheal tube tip is about 3 cm superior to carina. Esophageal tube tip is below the diaphragm. Bilateral pleural effusions, right greater than left. Bibasilar atelectasis or pneumonia. Mild cardiomegaly with aortic atherosclerosis. Diffuse right greater than left hazy pulmonary opacity may reflect layering effusion or background pulmonary edema. No pneumothorax. IMPRESSION: 1. Endotracheal tube tip about 3 cm superior to carina 2. Small moderate bilateral likely layering pleural effusions, right greater than left 3. Cardiomegaly with central congestion. Hazy bilateral pulmonary opacity likely reflects combination of layering effusion and underlying pulmonary edema 4. Consolidation at the lung bases, cannot exclude atelectasis or pneumonia Electronically Signed   By: Donavan Foil M.D.   On: 01/25/2017 22:17    Procedures Procedure Name: Intubation Date/Time: 01/21/2017 11:55 PM Performed by: Blanchie Dessert Pre-anesthesia Checklist: Patient identified, Emergency Drugs available, Suction available and Patient being monitored Oxygen Delivery Method: Ambu bag Preoxygenation: Pre-oxygenation with 100% oxygen Induction Type: Rapid sequence Ventilation: Mask ventilation without difficulty Laryngoscope Size: Glidescope and 3 Grade View: Grade II Tube size: 6.5 mm Number of attempts: 1 Airway Equipment and Method: Video-laryngoscopy Placement Confirmation: ETT inserted through vocal cords under direct vision,  Positive ETCO2,  CO2 detector and Breath sounds checked- equal and bilateral Secured at: 20 cm Tube secured with: ETT holder Dental Injury: Teeth and Oropharynx as per pre-operative assessment  Difficulty Due To: Difficulty was unanticipated      (including critical care time)  Medications Ordered in ED Medications  cefTRIAXone (ROCEPHIN) 1 g in dextrose 5 % 50 mL IVPB (not administered)  azithromycin (ZITHROMAX) 500 mg in dextrose 5 % 250 mL IVPB (not  administered)  fentaNYL (SUBLIMAZE) injection 50 mcg (50 mcg Intravenous Given 02/08/2017 2248)  ipratropium-albuterol (DUONEB) 0.5-2.5 (3) MG/3ML nebulizer solution 3 mL (not administered)  0.9 %  sodium chloride infusion (not administered)  pantoprazole (PROTONIX) injection 40 mg (not administered)  heparin bolus via infusion 1,000 Units (not administered)  heparin ADULT infusion 100 units/mL (25000 units/211mL sodium chloride 0.45%) (not administered)  fentaNYL (SUBLIMAZE) injection 12.5-25 mcg (not administered)  iopamidol (ISOVUE-370) 76 %  injection (not administered)  etomidate (AMIDATE) injection 20 mg (20 mg Intravenous Given 01/13/2017 2124)  fentaNYL (SUBLIMAZE) injection 50 mcg (50 mcg Intravenous Given 01/18/2017 2138)  sodium chloride 0.9 % bolus 1,000 mL (0 mLs Intravenous Stopped 01/11/2017 2229)  sodium chloride 0.9 % bolus 1,000 mL (0 mLs Intravenous Stopped 01/15/2017 2324)    And  sodium chloride 0.9 % bolus 250 mL (0 mLs Intravenous Stopped 01/15/2017 2325)  cefTRIAXone (ROCEPHIN) 1 g in dextrose 5 % 50 mL IVPB (0 g Intravenous Stopped 01/17/2017 2324)  azithromycin (ZITHROMAX) 500 mg in dextrose 5 % 250 mL IVPB (500 mg Intravenous New Bag/Given 01/17/2017 2222)     Initial Impression / Assessment and Plan / ED Course  I have reviewed the triage vital signs and the nursing notes.  Pertinent labs & imaging results that were available during my care of the patient were reviewed by me and considered in my medical decision making (see chart for details).    Patient is an elderly female presenting today with acute onset of shortness of breath. Patient denies any chest pain upon arrival here but oxygen saturation of 65% with BiPAP. Also patient hypotensive here despite being hypertensive with EMS. Per family symptoms started suddenly. Concern for potential flash pulmonary edema however hypotension does not fit in that setting. Also concern for potential sepsis with pneumonia in the setting of  hypothermia, tachycardia, hypotension and hypoxia however abrupt onset of symptoms is unusual. Also concern for potential PE as patient did have some redness and swelling of her right foot not long ago and has a prior history of DVT.  Because of patient's respiratory distress she was intubated upon arrival with improvement in oxygen saturation. IV fluids were initiated with improvement of blood pressure from the 93T systolic to the 34K and 87G.  Chest x-ray which looks more classic for pulmonary edema. BNP of 596 and BMP with normal renal function and hyponatremia of 129. ABG with mild cytosis, troponin of 0.09, normal hemoglobin and a lactic acid of 6. CTA of the chest pending for further evaluation. Lower suspicion for AAA as patient was having no abdominal pain. Patient was covered with Rocephin and azithromycin for potential respiratory pathogens. Critical care to admit  CRITICAL CARE Performed by: Blanchie Dessert Total critical care time: 30 minutes Critical care time was exclusive of separately billable procedures and treating other patients. Critical care was necessary to treat or prevent imminent or life-threatening deterioration. Critical care was time spent personally by me on the following activities: development of treatment plan with patient and/or surrogate as well as nursing, discussions with consultants, evaluation of patient's response to treatment, examination of patient, obtaining history from patient or surrogate, ordering and performing treatments and interventions, ordering and review of laboratory studies, ordering and review of radiographic studies, pulse oximetry and re-evaluation of patient's condition.    Final Clinical Impressions(s) / ED Diagnoses   Final diagnoses:  Acute respiratory failure with hypoxia (HCC)  Respiratory distress  Hypotension, unspecified hypotension type    New Prescriptions New Prescriptions   No medications on file     Blanchie Dessert,  MD 01/11/2017 2358

## 2017-02-04 NOTE — ED Notes (Signed)
Intensivist at bedside prior to transport; CT Angio will be d/c'd per intensivist

## 2017-02-04 NOTE — ED Triage Notes (Signed)
Per EMS Pt is from home with family; Pt has hx of Cytoma of lungs; Had episode of SOB x 1 hours with wheezing on right side; pt was given 5 mg Albuterol, 3 nitro and 324 mg ASA en route; Pt arrived on CPAP with sats at 90% and changed to BiPAP on arrival.

## 2017-02-04 NOTE — ED Notes (Signed)
Intensivist NP at bedside

## 2017-02-05 ENCOUNTER — Inpatient Hospital Stay (HOSPITAL_COMMUNITY): Payer: Medicare Other

## 2017-02-05 DIAGNOSIS — I340 Nonrheumatic mitral (valve) insufficiency: Secondary | ICD-10-CM

## 2017-02-05 DIAGNOSIS — E872 Acidosis: Secondary | ICD-10-CM

## 2017-02-05 DIAGNOSIS — J9601 Acute respiratory failure with hypoxia: Secondary | ICD-10-CM

## 2017-02-05 DIAGNOSIS — L8990 Pressure ulcer of unspecified site, unspecified stage: Secondary | ICD-10-CM | POA: Insufficient documentation

## 2017-02-05 DIAGNOSIS — N179 Acute kidney failure, unspecified: Secondary | ICD-10-CM

## 2017-02-05 LAB — GLUCOSE, CAPILLARY
GLUCOSE-CAPILLARY: 110 mg/dL — AB (ref 65–99)
GLUCOSE-CAPILLARY: 130 mg/dL — AB (ref 65–99)
GLUCOSE-CAPILLARY: 158 mg/dL — AB (ref 65–99)
GLUCOSE-CAPILLARY: 69 mg/dL (ref 65–99)
Glucose-Capillary: 88 mg/dL (ref 65–99)
Glucose-Capillary: 99 mg/dL (ref 65–99)
Glucose-Capillary: 105 mg/dL — ABNORMAL HIGH (ref 65–99)
Glucose-Capillary: 116 mg/dL — ABNORMAL HIGH (ref 65–99)

## 2017-02-05 LAB — BLOOD CULTURE ID PANEL (REFLEXED)
Acinetobacter baumannii: NOT DETECTED
CANDIDA GLABRATA: NOT DETECTED
CANDIDA KRUSEI: NOT DETECTED
CANDIDA TROPICALIS: NOT DETECTED
Candida albicans: NOT DETECTED
Candida parapsilosis: NOT DETECTED
ENTEROBACTER CLOACAE COMPLEX: NOT DETECTED
ENTEROCOCCUS SPECIES: NOT DETECTED
ESCHERICHIA COLI: NOT DETECTED
Enterobacteriaceae species: NOT DETECTED
Haemophilus influenzae: NOT DETECTED
KLEBSIELLA PNEUMONIAE: NOT DETECTED
Klebsiella oxytoca: NOT DETECTED
LISTERIA MONOCYTOGENES: NOT DETECTED
METHICILLIN RESISTANCE: NOT DETECTED
Neisseria meningitidis: NOT DETECTED
PROTEUS SPECIES: NOT DETECTED
Pseudomonas aeruginosa: NOT DETECTED
SERRATIA MARCESCENS: NOT DETECTED
STAPHYLOCOCCUS AUREUS BCID: NOT DETECTED
STAPHYLOCOCCUS SPECIES: DETECTED — AB
Streptococcus agalactiae: NOT DETECTED
Streptococcus pneumoniae: NOT DETECTED
Streptococcus pyogenes: NOT DETECTED
Streptococcus species: NOT DETECTED

## 2017-02-05 LAB — CBC
HCT: 30.7 % — ABNORMAL LOW (ref 36.0–46.0)
HEMOGLOBIN: 10.1 g/dL — AB (ref 12.0–15.0)
MCH: 29.5 pg (ref 26.0–34.0)
MCHC: 32.9 g/dL (ref 30.0–36.0)
MCV: 89.8 fL (ref 78.0–100.0)
PLATELETS: 190 10*3/uL (ref 150–400)
RBC: 3.42 MIL/uL — AB (ref 3.87–5.11)
RDW: 13.7 % (ref 11.5–15.5)
WBC: 11.8 10*3/uL — ABNORMAL HIGH (ref 4.0–10.5)

## 2017-02-05 LAB — TRIGLYCERIDES: TRIGLYCERIDES: 41 mg/dL (ref <150)

## 2017-02-05 LAB — TROPONIN I
TROPONIN I: 0.59 ng/mL — AB (ref <0.03)
Troponin I: 0.59 ng/mL (ref <0.03)
Troponin I: 0.8 ng/mL (ref <0.03)

## 2017-02-05 LAB — POCT I-STAT 3, ART BLOOD GAS (G3+)
Acid-base deficit: 6 mmol/L — ABNORMAL HIGH (ref 0.0–2.0)
BICARBONATE: 19.2 mmol/L — AB (ref 20.0–28.0)
O2 SAT: 93 %
TCO2: 20 mmol/L — AB (ref 22–32)
pCO2 arterial: 37.4 mmHg (ref 32.0–48.0)
pH, Arterial: 7.317 — ABNORMAL LOW (ref 7.350–7.450)
pO2, Arterial: 69 mmHg — ABNORMAL LOW (ref 83.0–108.0)

## 2017-02-05 LAB — PHOSPHORUS
PHOSPHORUS: 3.7 mg/dL (ref 2.5–4.6)
PHOSPHORUS: 3.1 mg/dL (ref 2.5–4.6)

## 2017-02-05 LAB — RESPIRATORY PANEL BY PCR
Adenovirus: NOT DETECTED
Bordetella pertussis: NOT DETECTED
CORONAVIRUS 229E-RVPPCR: NOT DETECTED
CORONAVIRUS HKU1-RVPPCR: NOT DETECTED
CORONAVIRUS OC43-RVPPCR: NOT DETECTED
Chlamydophila pneumoniae: NOT DETECTED
Coronavirus NL63: NOT DETECTED
Influenza A: NOT DETECTED
Influenza B: NOT DETECTED
METAPNEUMOVIRUS-RVPPCR: NOT DETECTED
Mycoplasma pneumoniae: NOT DETECTED
PARAINFLUENZA VIRUS 1-RVPPCR: NOT DETECTED
PARAINFLUENZA VIRUS 2-RVPPCR: NOT DETECTED
Parainfluenza Virus 3: NOT DETECTED
Parainfluenza Virus 4: NOT DETECTED
Respiratory Syncytial Virus: NOT DETECTED
Rhinovirus / Enterovirus: NOT DETECTED

## 2017-02-05 LAB — BASIC METABOLIC PANEL
Anion gap: 13 (ref 5–15)
BUN: 12 mg/dL (ref 6–20)
CHLORIDE: 101 mmol/L (ref 101–111)
CO2: 16 mmol/L — AB (ref 22–32)
CREATININE: 0.86 mg/dL (ref 0.44–1.00)
Calcium: 8.1 mg/dL — ABNORMAL LOW (ref 8.9–10.3)
GFR calc non Af Amer: 59 mL/min — ABNORMAL LOW (ref >60)
Glucose, Bld: 119 mg/dL — ABNORMAL HIGH (ref 65–99)
Potassium: 3.8 mmol/L (ref 3.5–5.1)
Sodium: 130 mmol/L — ABNORMAL LOW (ref 135–145)

## 2017-02-05 LAB — HEPARIN LEVEL (UNFRACTIONATED)
HEPARIN UNFRACTIONATED: 0.22 [IU]/mL — AB (ref 0.30–0.70)
HEPARIN UNFRACTIONATED: 0.47 [IU]/mL (ref 0.30–0.70)

## 2017-02-05 LAB — CORTISOL: CORTISOL PLASMA: 14.7 ug/dL

## 2017-02-05 LAB — ECHOCARDIOGRAM COMPLETE
HEIGHTINCHES: 60 in
Weight: 1421.53 oz

## 2017-02-05 LAB — MAGNESIUM
MAGNESIUM: 2 mg/dL (ref 1.7–2.4)
Magnesium: 1.7 mg/dL (ref 1.7–2.4)

## 2017-02-05 LAB — LACTIC ACID, PLASMA
LACTIC ACID, VENOUS: 3.2 mmol/L — AB (ref 0.5–1.9)
LACTIC ACID, VENOUS: 3.4 mmol/L — AB (ref 0.5–1.9)

## 2017-02-05 LAB — INFLUENZA PANEL BY PCR (TYPE A & B)
Influenza A By PCR: NEGATIVE
Influenza B By PCR: NEGATIVE

## 2017-02-05 LAB — PROCALCITONIN: PROCALCITONIN: 0.42 ng/mL

## 2017-02-05 LAB — D-DIMER, QUANTITATIVE: D-Dimer, Quant: 3.74 ug/mL-FEU — ABNORMAL HIGH (ref 0.00–0.50)

## 2017-02-05 LAB — MRSA PCR SCREENING: MRSA BY PCR: NEGATIVE

## 2017-02-05 MED ORDER — VITAL AF 1.2 CAL PO LIQD
1000.0000 mL | ORAL | Status: DC
  Administered 2017-02-05 – 2017-02-06 (×2): 1000 mL
  Filled 2017-02-05 (×2): qty 1000

## 2017-02-05 MED ORDER — CHLORHEXIDINE GLUCONATE 0.12% ORAL RINSE (MEDLINE KIT)
15.0000 mL | Freq: Two times a day (BID) | OROMUCOSAL | Status: DC
  Administered 2017-02-05 – 2017-02-07 (×6): 15 mL via OROMUCOSAL

## 2017-02-05 MED ORDER — ORAL CARE MOUTH RINSE
15.0000 mL | Freq: Four times a day (QID) | OROMUCOSAL | Status: DC
  Administered 2017-02-05 – 2017-02-07 (×11): 15 mL via OROMUCOSAL

## 2017-02-05 MED ORDER — POTASSIUM CHLORIDE 20 MEQ/15ML (10%) PO SOLN
20.0000 meq | Freq: Once | ORAL | Status: AC
  Administered 2017-02-05: 20 meq via ORAL
  Filled 2017-02-05: qty 15

## 2017-02-05 MED ORDER — DEXTROSE 50 % IV SOLN
25.0000 mL | Freq: Once | INTRAVENOUS | Status: AC
  Administered 2017-02-05: 25 mL via INTRAVENOUS

## 2017-02-05 MED ORDER — SODIUM CHLORIDE 0.9 % IV BOLUS (SEPSIS)
500.0000 mL | Freq: Once | INTRAVENOUS | Status: AC
  Administered 2017-02-05: 500 mL via INTRAVENOUS

## 2017-02-05 MED ORDER — FENTANYL 2500MCG IN NS 250ML (10MCG/ML) PREMIX INFUSION
0.0000 ug/h | INTRAVENOUS | Status: DC
  Administered 2017-02-06: 10 ug/h via INTRAVENOUS
  Filled 2017-02-05: qty 250

## 2017-02-05 MED ORDER — PROPOFOL 1000 MG/100ML IV EMUL
0.0000 ug/kg/min | INTRAVENOUS | Status: DC
  Administered 2017-02-05: 5 ug/kg/min via INTRAVENOUS
  Filled 2017-02-05: qty 100

## 2017-02-05 MED ORDER — MAGNESIUM SULFATE 2 GM/50ML IV SOLN
2.0000 g | Freq: Once | INTRAVENOUS | Status: AC
  Administered 2017-02-05: 2 g via INTRAVENOUS
  Filled 2017-02-05: qty 50

## 2017-02-05 MED ORDER — LACTATED RINGERS IV SOLN
INTRAVENOUS | Status: DC
  Administered 2017-02-05 – 2017-02-06 (×3): via INTRAVENOUS

## 2017-02-05 MED ORDER — SODIUM CHLORIDE 0.9 % IV SOLN
0.4000 ug/kg/h | INTRAVENOUS | Status: DC
  Administered 2017-02-05 – 2017-02-06 (×4): 0.4 ug/kg/h via INTRAVENOUS
  Filled 2017-02-05 (×4): qty 2

## 2017-02-05 MED ORDER — INSULIN ASPART 100 UNIT/ML ~~LOC~~ SOLN
2.0000 [IU] | SUBCUTANEOUS | Status: DC
  Administered 2017-02-05: 4 [IU] via SUBCUTANEOUS
  Administered 2017-02-06 (×3): 2 [IU] via SUBCUTANEOUS
  Administered 2017-02-06: 4 [IU] via SUBCUTANEOUS

## 2017-02-05 MED ORDER — SODIUM CHLORIDE 0.9 % IV BOLUS (SEPSIS)
250.0000 mL | Freq: Once | INTRAVENOUS | Status: AC
  Administered 2017-02-05: 250 mL via INTRAVENOUS

## 2017-02-05 MED ORDER — DEXTROSE 50 % IV SOLN
INTRAVENOUS | Status: AC
  Filled 2017-02-05: qty 50

## 2017-02-05 MED ORDER — MIDAZOLAM HCL 2 MG/2ML IJ SOLN
2.0000 mg | Freq: Once | INTRAMUSCULAR | Status: AC
  Administered 2017-02-05: 2 mg via INTRAVENOUS

## 2017-02-05 MED ORDER — SODIUM CHLORIDE 0.9 % IV SOLN
0.5000 mg/h | INTRAVENOUS | Status: DC
  Filled 2017-02-05: qty 10

## 2017-02-05 NOTE — Progress Notes (Signed)
ANTICOAGULATION CONSULT NOTE - FOLLOW UP    HL = 0.47 (goal 0.3 - 0.7 units/mL) Heparin dosing weight = 40 kg   Assessment: 54 YOF with history of recurrent VTE continues on IV heparin while Coumadin is on hold.  Heparin level is therapeutic; no bleeding reported.   Plan: Continue heparin gtt at 800 units/hr F/U AM labs   Madeliene Tejera D. Mina Marble, PharmD, BCPS 02/05/2017, 7:18 PM

## 2017-02-05 NOTE — Progress Notes (Signed)
RT note-Attempted wean with 5/5, RR 40's, increased PD to +14, tolerating well at this time, continue to monitor.

## 2017-02-05 NOTE — Progress Notes (Signed)
**  Preliminary report by tech**  Bilateral lower extremity venous duplex completed. There is no evidence of deep or superficial vein thrombosis involving the right and left lower extremities. All visualized vessels appear patent and compressible. There is no evidence of Baker's cysts bilaterally.  02/05/17 1:00 PM Carlos Levering RVT

## 2017-02-05 NOTE — Progress Notes (Signed)
ANTICOAGULATION CONSULT NOTE - Initial Consult  Pharmacy Consult for heparin Indication: h/o recurrent DVT  Allergies  Allergen Reactions  . Adhesive [Tape] Other (See Comments)    TEARS THE SKIN; PLEASE USE COBAN WRAP!!!!  . Amoxicillin Hives and Rash    Has patient had a PCN reaction causing immediate rash, facial/tongue/throat swelling, SOB or lightheadedness with hypotension: Yes Has patient had a PCN reaction causing severe rash involving mucus membranes or skin necrosis: Yes Has patient had a PCN reaction that required hospitalization No Has patient had a PCN reaction occurring within the last 10 years: No If all of the above answers are "NO", then may proceed with Cephalosporin use.   [name] Kitchen Hydrocodone Nausea Only    Patient Measurements: Height: 5' (152.4 cm) Weight: 88 lb 13.5 oz (40.3 kg) IBW/kg (Calculated) : 45.5  Vital Signs: Temp: 97 F (36.1 C) (09/26 0841) Temp Source: Oral (09/26 0841) BP: 84/55 (09/26 1005) Pulse Rate: 88 (09/26 1005)  Labs:  Recent Labs  01/15/2017 2121 01/24/2017 2204 01/23/2017 2215 02/05/17 0245 02/05/17 0922  HGB 13.8  --   --   --   --   HCT 42.0  --   --   --   --   PLT 160  --   --   --   --   LABPROT  --  14.7  --   --   --   INR  --  1.16  --   --   --   HEPARINUNFRC  --   --   --   --  0.22*  CREATININE  --   --  0.98 0.86  --   TROPONINI  --   --   --  0.59*  --      Medical History: Past Medical History:  Diagnosis Date  . Arthritis   . Cancer ([place])    skin/face  . Compression fracture of L1 lumbar vertebra (HCC)    L3 and L4  . Dementia   . DVT (deep venous thrombosis) ([place])   . Elbow fracture 12/04/2013   rt elbow  . Family history of anesthesia complication    Daughter is difficult to intubate   . GERD (gastroesophageal reflux disease)   . Macular degeneration   . PONV (postoperative nausea and vomiting)     Assessment: 83yo female presents w/ SOB/wheeze, admitted to CCM service, on heparin for h/o  recurrent DVT; Coumadin PTA (INR close to BL on admission). Hgb 13.8. No bleeding.  HL subtherapeutic at 0.22 on 600 units/hr  Goal of Therapy:  Heparin level 0.3-0.7 units/ml Monitor platelets by anticoagulation protocol: Yes   Plan:  Increase heparin rate to 800 units/hr Monitor heparin levels and CBC

## 2017-02-05 NOTE — Progress Notes (Signed)
PULMONARY / CRITICAL CARE MEDICINE   Name: Vanessa Pace MRN: [id number] DOB: [birth year]    ADMISSION DATE:  01/20/2017 CONSULTATION DATE:  02/06/2017  REFERRING MD:  Dr. Maryan Rued   CHIEF COMPLAINT:  Hypoxia   HISTORY OF PRESENT ILLNESS:   83 year old female with PMH of multiple falls, DVT (on coumadin stopped about 3 months ago), GERD, Dementia, Cytoma of lungs   Presents to ED on 9/25 with progressive dyspnea. Family reports that patient lives with daughter and over the last year has been having multiple falls at home. Today patient was eating lunch when she then began coughing and breathing heavily, throughout the day this began to worsen and EMS was called. When EMS arrived patient was placed on CPAP and given 3 nitro for hypertension. Upon arrival to ED patient was hypoxic with saturation 50-60% on CPAP with BP 111/91. Intubated in ED. ABG 7.2/47/171. PCCM asked to admit.   ed.   SUBJECTIVE: Agitated. Hypotensive on  Diprivan therefore it was dc'd. Weaning as tolerated.  VITAL SIGNS: BP (!) 84/55   Pulse 88   Temp (!) 97 F (36.1 C) (Oral)   Resp 20   Ht 5' (1.524 m)   Wt 88 lb 13.5 oz (40.3 kg)   SpO2 98%   BMI 17.35 kg/m   HEMODYNAMICS:    VENTILATOR SETTINGS: Vent Mode: PRVC FiO2 (%):  [50 %-100 %] 50 % Set Rate:  [15 bmp] 15 bmp Vt Set:  [400 mL] 400 mL PEEP:  [5 cmH20] 5 cmH20 Plateau Pressure:  [13 cmH20-23 cmH20] 23 cmH20  INTAKE / OUTPUT: I/O last 3 completed shifts: In: 3003.5 [I.V.:433.5; NG/GT:20; IV Piggyback:2550] Out: 100 [Emesis/NG output:100]  PHYSICAL EXAMINATION: General:  Frail, ill appearing elderly female HEENT: ET-> vent WUJ:WJXBJYN Neuro: agitated at times CV: HSR RRR PULM: decreased air movement WG:NFAO, non-tender, bsx4 active  Extremities: warm/dry, + edema , rt foot dressing Skin: no rashes or lesions  LABS:  BMET  Recent Labs Lab 01/13/2017 2215 02/05/17 0245  NA 129* 130*  K 3.7 3.8  CL 102 101  CO2 17* 16*  BUN  14 12  CREATININE 0.98 0.86  GLUCOSE 205* 119*    Electrolytes  Recent Labs Lab 02/02/2017 2215 02/05/17 0245  CALCIUM 7.9* 8.1*  MG  --  1.7  PHOS  --  3.7    CBC  Recent Labs Lab 02/06/2017 2121  WBC 7.5  HGB 13.8  HCT 42.0  PLT 160    Coag's  Recent Labs Lab 01/21/2017 2204  INR 1.16    Sepsis Markers  Recent Labs Lab 01/14/2017 2200 02/05/17 0245 02/05/17 0657  LATICACIDVEN 6.02* 3.2* 3.4*  PROCALCITON  --  0.42  --     ABG  Recent Labs Lab 01/14/2017 2200 02/05/17 0351  PHART 7.236* 7.317*  PCO2ART 47.1 37.4  PO2ART 171.0* 69.0*    Liver Enzymes No results for input(s): AST, ALT, ALKPHOS, BILITOT, ALBUMIN in the last 168 hours.  Cardiac Enzymes  Recent Labs Lab 02/05/17 0245  TROPONINI 0.59*    Glucose  Recent Labs Lab 02/05/17 0036 02/05/17 0421 02/05/17 0838 02/05/17 0936  GLUCAP 158* 88 69 110*    Imaging Dg Chest Portable 1 View  Result Date: 01/20/2017 CLINICAL DATA:  Intubated EXAM: PORTABLE CHEST 1 VIEW COMPARISON:  04/25/2016, radiograph 04/11/2016 FINDINGS: Endotracheal tube tip is about 3 cm superior to carina. Esophageal tube tip is below the diaphragm. Bilateral pleural effusions, right greater than left. Bibasilar atelectasis or pneumonia. Mild  cardiomegaly with aortic atherosclerosis. Diffuse right greater than left hazy pulmonary opacity may reflect layering effusion or background pulmonary edema. No pneumothorax. IMPRESSION: 1. Endotracheal tube tip about 3 cm superior to carina 2. Small moderate bilateral likely layering pleural effusions, right greater than left 3. Cardiomegaly with central congestion. Hazy bilateral pulmonary opacity likely reflects combination of layering effusion and underlying pulmonary edema 4. Consolidation at the lung bases, cannot exclude atelectasis or pneumonia Electronically Signed   By: Donavan Foil M.D.   On: 01/14/2017 22:17     STUDIES:  CXR 9/25 >  1. Endotracheal tube tip about 3 cm  superior to carina 2. Small moderate bilateral likely layering pleural effusions, right greater than left 3. Cardiomegaly with central congestion. Hazy bilateral pulmonary opacity likely reflects combination of layering effusion and underlying pulmonary edema 4. Consolidation at the lung bases, cannot exclude atelectasis or Pneumonia ECHO 9/26 >>   CULTURES: Blood 9/26 >> Tracheal 9/26 >> U/A 9/26 >>   ANTIBIOTICS: Rocephin 9/25 >> Azithromycin 9/25 >>  SIGNIFICANT EVENTS: 9/25 > Presents to ED   LINES/TUBES: ETT 9/25 >> PIV   DISCUSSION: 83 year old female presents to ED with progressive hypoxia requiring intubation. Weaning 9/26. Need EOL clarification. Diprivan dc'd (hypotension)  ASSESSMENT / PLAN:  PULMONARY A: Acute Hypoxic Respiratory Failure in setting of ASP PNA vs CAP +/- Viral Source, ?PE  H/O DVT, Cytoma of Lungs  P:   Vent Support Trend ABG/CXR Scheduled Duoneb  Wells Score 7.5 > LE Dopplers, ECHO, Heparin gtt given history and score until r/o   CARDIOVASCULAR A:  Septic Shock  Elevated Troponin secondary to ?demand ischemia vs ?PE P:  Cardiac Monitoring  Maintain MAP >65 Trend Troponin .59->.80 ECHO pending   RENAL Lab Results  Component Value Date   CREATININE 0.86 02/05/2017   CREATININE 0.98 01/18/2017   CREATININE 0.50 08/24/2016    Recent Labs Lab 01/21/2017 2215 02/05/17 0245  K 3.7 3.8     Recent Labs Lab 02/08/2017 2215 02/05/17 0245  NA 129* 130*   Lactic Acid, Venous    Component Value Date/Time   LATICACIDVEN 3.4 (HH) 02/05/2017 0657   A:   AKI  Non-Gap Metabolic Acidosis   P:   Trend BMP  Replace electrolytes as needed  LR @ 75 ml/hr  Trend Lactic Acid   GASTROINTESTINAL A:   GERD ?Dysphagia  P:   NPO PPI  Speech Therapy Evaluation once extubated   HEMATOLOGIC  Recent Labs  01/26/2017 2121  HGB 13.8    A:   ?PE H/O DVT  P:  Trend CBC  Maintain Hbg > 7  Heparin per pharmacy    INFECTIOUS A:   ?Aspiration PNA vs CAP +/-Viral Source  -Afebrile, WBC 7.5, LA 6.02 ->3.4 procal .42 P:   Trend WBC and Fever Curve  Trend Lactic Acid and PCT  Follow Culture Data  Antibiotics as above  RVP neg and Flu neg  ENDOCRINE A:   Hyperglycemia    P:   Trend Glucose  SSI   NEUROLOGIC A:   Sedation Needs  Dementia  P:   RASS goal: 0/-1, currently raas 1 Propofol gtt , will dc for hypotension and use prn fentanyl PRN Fentanyl    FAMILY  - Updates: Daughter and son-in law updated. Family wishes for patient to not undergo CPR however would like to continue with all other medical therapies  9/26 no family at bedside - Inter-disciplinary family meet or Palliative Care meeting due by: 02/11/2017  CC Time: 45 minutes   Richardson Landry Minor ACNP Maryanna Shape PCCM Pager [phone] till 3 pm If no answer page [phone] 02/05/2017, 10:48 AM  Attending Note:  I have examined patient, reviewed labs, studies and notes. I have discussed the case with S Minor, and I agree with the data and plans as amended above. 83 yo woman, admitted with acute hypoxemic resp failure in the setting of B effusions and infiltrates. She required intubation and MV. Consider NSTEMI, PE. Currently on heparin. Treated empirically for possible PNA w azithro + ceftriaxone. On eval today she is wide awake and gestures, moves ext, tries to communicate. She is tachypneic on PSV. Heart tachy, regular. Coarse breath sounds. Abdomen benign. We will pan to continue to push for PSV trials with a goal extubation next 24-48h. She would not want her care escalated - is DNR in event of an arrest. We will continue current medical regimen. Status reviewed with patient's family at bedside.  Independent critical care time is 35 minutes.   Baltazar Apo, MD, PhD 02/05/2017, 3:49 PM [facility] [phone] or if no answer [phone]

## 2017-02-05 NOTE — Progress Notes (Signed)
CRITICAL VALUE ALERT  Critical Value:  LA= 3.2, Trop I= 0.59  Date & Time Notied:  02/05/17 0354  Provider Notified: Dr Voncille Lo RN  Orders Received/Actions taken: none

## 2017-02-05 NOTE — ED Notes (Signed)
Pt came awake and was pulling at tubes; EDP notified for immediate assistance for transport; new order given and administered

## 2017-02-05 NOTE — Progress Notes (Signed)
2D Echocardiogram has been performed.  Vanessa Pace 02/05/2017, 1:52 PM

## 2017-02-05 NOTE — Progress Notes (Signed)
CRITICAL VALUE ALERT  Critical Value:  Lactic acid 3.4  Date & Time Notied:  02/05/17 0744  Provider Notified: Dr Lamonte Sakai  Orders Received/Actions taken: no orders given

## 2017-02-05 NOTE — H&P (Signed)
PULMONARY / CRITICAL CARE MEDICINE   Name: Vanessa Pace MRN: [id number] DOB: [birth year]    ADMISSION DATE:  02/05/2017 CONSULTATION DATE:  01/12/2017  REFERRING MD:  Dr. Maryan Rued   CHIEF COMPLAINT:  Hypoxia   HISTORY OF PRESENT ILLNESS:   83 year old female with PMH of multiple falls, DVT (on coumadin stopped about 3 months ago), GERD, Dementia, Cytoma of lungs   Presents to ED on 9/25 with progressive dyspnea. Family reports that patient lives with daughter and over the last year has been having multiple falls at home. Today patient was eating lunch when she then began coughing and breathing heavily, throughout the day this began to worsen and EMS was called. When EMS arrived patient was placed on CPAP and given 3 nitro for hypertension. Upon arrival to ED patient was hypoxic with saturation 50-60% on CPAP with BP 111/91. Intubated in ED. ABG 7.2/47/171. PCCM asked to admit.   PAST MEDICAL HISTORY :  She  has a past medical history of Arthritis; Cancer ([place]); Compression fracture of L1 lumbar vertebra ([place]); Dementia; DVT (deep venous thrombosis) ([place]); Elbow fracture (12/04/2013); Family history of anesthesia complication; GERD (gastroesophageal reflux disease); Macular degeneration; and PONV (postoperative nausea and vomiting).  PAST SURGICAL HISTORY: She  has a past surgical history that includes Fracture surgery; femur surgery; ORIF elbow fracture (Right, 12/06/2013); Colonoscopy w/ biopsies and polypectomy; cataracts; Hip surgery; and Kyphoplasty (N/A, 04/11/2016).  Allergies  Allergen Reactions  . Adhesive [Tape] Other (See Comments)    TEARS THE SKIN; PLEASE USE COBAN WRAP!!!!  . Amoxicillin Hives and Rash    Has patient had a PCN reaction causing immediate rash, facial/tongue/throat swelling, SOB or lightheadedness with hypotension: Yes Has patient had a PCN reaction causing severe rash involving mucus membranes or skin necrosis: Yes Has patient had a PCN reaction that required  hospitalization No Has patient had a PCN reaction occurring within the last 10 years: No If all of the above answers are "NO", then may proceed with Cephalosporin use.   [name] Kitchen Hydrocodone Nausea Only    No current facility-administered medications on file prior to encounter.    Current Outpatient Prescriptions on File Prior to Encounter  Medication Sig  . acetaminophen (TYLENOL) 325 MG tablet Take 2 tablets (650 mg total) by mouth every 8 (eight) hours. (Patient taking differently: Take 650 mg by mouth every 6 (six) hours as needed for mild pain. )  . DiphenhydrAMINE HCl, Sleep, (UNISOM SLEEPGELS) 50 MG CAPS Take 50 mg by mouth at bedtime.  . enoxaparin (LOVENOX) 100 MG/ML injection Inject 0.4 mLs (40 mg total) into the skin every 12 (twelve) hours.  . Multiple Vitamins-Minerals (ICAPS AREDS 2 PO) Take 1 capsule by mouth 2 (two) times daily.   [name] Kitchen warfarin (COUMADIN) 2.5 MG tablet Take 1 tablet (2.5 mg total) by mouth daily. Take 2 pills (5mg ) daily for the first 2 days, then 1 pill by mouth daily thereafter    FAMILY HISTORY:  Her indicated that the status of her father is unknown. She indicated that the status of her daughter is unknown. She indicated that the status of her son is unknown.    SOCIAL HISTORY: She  reports that she has never smoked. She has never used smokeless tobacco. She reports that she drinks alcohol. She reports that she does not use drugs.  REVIEW OF SYSTEMS:   Unable to review as patient is intubated and sedated.   SUBJECTIVE:   VITAL SIGNS: BP (!) 83/59   Pulse  94   Temp (S) (!) 95.9 F (35.5 C) (Rectal) Comment: Bear Hugger applied   Resp 16   Ht 5' (1.524 m)   Wt 45.4 kg (100 lb)   SpO2 99%   BMI 19.53 kg/m   HEMODYNAMICS:    VENTILATOR SETTINGS: Vent Mode: PRVC FiO2 (%):  [100 %] 100 % Set Rate:  [15 bmp] 15 bmp Vt Set:  [400 mL] 400 mL  INTAKE / OUTPUT: No intake/output data recorded.  PHYSICAL EXAMINATION: General:  Elderly female   Neuro:  Sedated, moves extremities, opens eyes, withdrawals to pain  HEENT:  ETT in place, Dry MM Cardiovascular:  RRR, no MRG  Lungs:  Diminished to bases, no wheeze/crackles  Abdomen:  Non-distended, active bowel sounds  Musculoskeletal:  -edema  Skin:  Warm, dry   LABS:  BMET  Recent Labs Lab 01/17/2017 2215  NA 129*  K 3.7  CL 102  CO2 17*  BUN 14  CREATININE 0.98  GLUCOSE 205*    Electrolytes  Recent Labs Lab 01/19/2017 2215  CALCIUM 7.9*    CBC  Recent Labs Lab 01/17/2017 2121  WBC 7.5  HGB 13.8  HCT 42.0  PLT 160    Coag's  Recent Labs Lab 01/25/2017 2204  INR 1.16    Sepsis Markers  Recent Labs Lab 01/16/2017 2200  LATICACIDVEN 6.02*    ABG  Recent Labs Lab 02/05/2017 2200  PHART 7.236*  PCO2ART 47.1  PO2ART 171.0*    Liver Enzymes No results for input(s): AST, ALT, ALKPHOS, BILITOT, ALBUMIN in the last 168 hours.  Cardiac Enzymes No results for input(s): TROPONINI, PROBNP in the last 168 hours.  Glucose No results for input(s): GLUCAP in the last 168 hours.  Imaging Dg Chest Portable 1 View  Result Date: 01/17/2017 CLINICAL DATA:  Intubated EXAM: PORTABLE CHEST 1 VIEW COMPARISON:  04/25/2016, radiograph 04/11/2016 FINDINGS: Endotracheal tube tip is about 3 cm superior to carina. Esophageal tube tip is below the diaphragm. Bilateral pleural effusions, right greater than left. Bibasilar atelectasis or pneumonia. Mild cardiomegaly with aortic atherosclerosis. Diffuse right greater than left hazy pulmonary opacity may reflect layering effusion or background pulmonary edema. No pneumothorax. IMPRESSION: 1. Endotracheal tube tip about 3 cm superior to carina 2. Small moderate bilateral likely layering pleural effusions, right greater than left 3. Cardiomegaly with central congestion. Hazy bilateral pulmonary opacity likely reflects combination of layering effusion and underlying pulmonary edema 4. Consolidation at the lung bases, cannot  exclude atelectasis or pneumonia Electronically Signed   By: Donavan Foil M.D.   On: 01/26/2017 22:17     STUDIES:  CXR 9/25 >  1. Endotracheal tube tip about 3 cm superior to carina 2. Small moderate bilateral likely layering pleural effusions, right greater than left 3. Cardiomegaly with central congestion. Hazy bilateral pulmonary opacity likely reflects combination of layering effusion and underlying pulmonary edema 4. Consolidation at the lung bases, cannot exclude atelectasis or Pneumonia ECHO 9/26 >>   CULTURES: Blood 9/26 >> Tracheal 9/26 >> U/A 9/26 >>   ANTIBIOTICS: Rocephin 9/25 >> Azithromycin 9/25 >>  SIGNIFICANT EVENTS: 9/25 > Presents to ED   LINES/TUBES: ETT 9/25 >> PIV   DISCUSSION: 83 year old female presents to ED with progressive hypoxia requiring intubation.   ASSESSMENT / PLAN:  PULMONARY A: Acute Hypoxic Respiratory Failure in setting of ASP PNA vs CAP +/- Viral Source, ?PE  H/O DVT, Cytoma of Lungs  P:   Vent Support Trend ABG/CXR Scheduled Duoneb  Wells Score 7.5 >  LE Dopplers, ECHO, Heparin gtt given history and score until r/o   CARDIOVASCULAR A:  Septic Shock  Elevated Troponin secondary to ?demand ischemia vs ?PE P:  Cardiac Monitoring  Maintain MAP >65 Trend Troponin  ECHO pending   RENAL A:   AKI  Non-Gap Metabolic Acidosis   P:   Trend BMP  Replace electrolytes as needed  LR @ 75 ml/hr  Trend Lactic Acid   GASTROINTESTINAL A:   GERD ?Dysphagia  P:   NPO PPI  Speech Therapy Evaluation once extubated   HEMATOLOGIC A:   ?PE H/O DVT  P:  Trend CBC  Maintain Hbg > 7  Heparin per pharmacy   INFECTIOUS A:   ?Aspiration PNA vs CAP +/-Viral Source  -Afebrile, WBC 7.5, LA 6.02  P:   Trend WBC and Fever Curve  Trend Lactic Acid and PCT  Follow Culture Data  Antibiotics as above  RVP and Flu pending   ENDOCRINE A:   Hyperglycemia    P:   Trend Glucose  SSI   NEUROLOGIC A:   Sedation Needs   Dementia  P:   RASS goal: 0/-1 Propofol gtt  PRN Fentanyl    FAMILY  - Updates: Daughter and son-in law updated. Family wishes for patient to not undergo CPR however would like to continue with all other medical therapies   - Inter-disciplinary family meet or Palliative Care meeting due by: 02/11/2017   CC Time: 26 minutes   Hayden Pedro, AGACNP-BC [place]  Pgr: [phone]  PCCM Pgr: [phone]

## 2017-02-05 NOTE — Progress Notes (Signed)
Initial Nutrition Assessment  DOCUMENTATION CODES:   Severe malnutrition in context of chronic illness, Underweight  INTERVENTION:    Vital AF 1.2 at 35 ml/h (840 ml per day)  Provides 1008 kcal, 63 gm protein, 681 ml free water daily  NUTRITION DIAGNOSIS:   Malnutrition (severe) related to chronic illness (dementia, cytoma of the lungs) as evidenced by severe depletion of muscle mass, severe depletion of body fat.  GOAL:   Patient will meet greater than or equal to 90% of their needs  MONITOR:   Vent status, TF tolerance, Skin, I & O's  REASON FOR ASSESSMENT:   Consult (verbal consult) Enteral/tube feeding initiation and management  ASSESSMENT:   83 yo female with PMH of multiple falls, DVT, GERD, dementia, cytoma of lungs who was admitted on 9/25 with progressive dyspnea and required intubation in the ED.  RN reports that patient failed wean this morning. Her glucose has also been low. Spoke with CCM NP, okay for RD to write TF orders. Nutrition-Focused physical exam completed. Findings are severe fat depletion, mild/moderate and severe muscle depletion, and no edema.  Patient is currently intubated on ventilator support MV: 9.4 L/min Temp (24hrs), Avg:97.1 F (36.2 C), Min:95.9 F (35.5 C), Max:97.9 F (36.6 C)   Labs reviewed sodium 130 (L) Medications reviewed. Propofol discontinued due to hypotension; Precedex being initiated.  Diet Order:  Diet NPO time specified  Skin:  Wound (see comment) (stage I to sacrum and R heel)  Last BM:  unknown  Height:   Ht Readings from Last 1 Encounters:  02/05/17 5' (1.524 m)    Weight:   Wt Readings from Last 1 Encounters:  02/05/17 88 lb 13.5 oz (40.3 kg)    Ideal Body Weight:  45.5 kg  BMI:  Body mass index is 17.35 kg/m.  Estimated Nutritional Needs:   Kcal:  1000  Protein:  60-75 gm  Fluid:  1 L  EDUCATION NEEDS:   No education needs identified at this time  Molli Barrows, [place], [place],  [place] Pager [phone] After Hours Pager [phone]

## 2017-02-06 ENCOUNTER — Inpatient Hospital Stay (HOSPITAL_COMMUNITY): Payer: Medicare Other

## 2017-02-06 ENCOUNTER — Encounter (HOSPITAL_COMMUNITY): Payer: Self-pay | Admitting: Pulmonary Disease

## 2017-02-06 DIAGNOSIS — J9601 Acute respiratory failure with hypoxia: Secondary | ICD-10-CM

## 2017-02-06 LAB — BLOOD GAS, ARTERIAL
Acid-base deficit: 5.2 mmol/L — ABNORMAL HIGH (ref 0.0–2.0)
BICARBONATE: 18 mmol/L — AB (ref 20.0–28.0)
DRAWN BY: 51133
FIO2: 40
LHR: 15 {breaths}/min
MECHVT: 400 mL
O2 Saturation: 92.2 %
PATIENT TEMPERATURE: 98.6
PCO2 ART: 25.6 mmHg — AB (ref 32.0–48.0)
PEEP: 5 cmH2O
PO2 ART: 60.1 mmHg — AB (ref 83.0–108.0)
pH, Arterial: 7.46 — ABNORMAL HIGH (ref 7.350–7.450)

## 2017-02-06 LAB — GLUCOSE, CAPILLARY
Comment 1: 1
GLUCOSE-CAPILLARY: 158 mg/dL — AB (ref 65–99)
GLUCOSE-CAPILLARY: 131 mg/dL — AB (ref 65–99)
GLUCOSE-CAPILLARY: 108 mg/dL — AB (ref 65–99)
Glucose-Capillary: 132 mg/dL — ABNORMAL HIGH (ref 65–99)
Glucose-Capillary: 120 mg/dL — ABNORMAL HIGH (ref 65–99)

## 2017-02-06 LAB — CBC
HCT: 27.8 % — ABNORMAL LOW (ref 36.0–46.0)
Hemoglobin: 9.3 g/dL — ABNORMAL LOW (ref 12.0–15.0)
MCH: 29.4 pg (ref 26.0–34.0)
MCHC: 33.5 g/dL (ref 30.0–36.0)
MCV: 88 fL (ref 78.0–100.0)
PLATELETS: 169 10*3/uL (ref 150–400)
RBC: 3.16 MIL/uL — ABNORMAL LOW (ref 3.87–5.11)
RDW: 13.8 % (ref 11.5–15.5)
WBC: 11.6 10*3/uL — AB (ref 4.0–10.5)

## 2017-02-06 LAB — BASIC METABOLIC PANEL
Anion gap: 10 (ref 5–15)
BUN: 18 mg/dL (ref 6–20)
CALCIUM: 8.4 mg/dL — AB (ref 8.9–10.3)
CO2: 19 mmol/L — ABNORMAL LOW (ref 22–32)
CREATININE: 0.59 mg/dL (ref 0.44–1.00)
Chloride: 100 mmol/L — ABNORMAL LOW (ref 101–111)
GFR calc Af Amer: >60 mL/min (ref >60)
GLUCOSE: 121 mg/dL — AB (ref 65–99)
Potassium: 3.8 mmol/L (ref 3.5–5.1)
SODIUM: 129 mmol/L — AB (ref 135–145)

## 2017-02-06 LAB — CULTURE, BLOOD (ROUTINE X 2): Special Requests: ADEQUATE

## 2017-02-06 LAB — MAGNESIUM: MAGNESIUM: 1.9 mg/dL (ref 1.7–2.4)

## 2017-02-06 LAB — PROCALCITONIN: Procalcitonin: 1.04 ng/mL

## 2017-02-06 LAB — PHOSPHORUS: Phosphorus: 2.6 mg/dL (ref 2.5–4.6)

## 2017-02-06 MED ORDER — SODIUM CHLORIDE 0.9 % IV SOLN
INTRAVENOUS | Status: DC
  Administered 2017-02-06 – 2017-02-07 (×2): via INTRAVENOUS

## 2017-02-06 MED ORDER — ALBUTEROL SULFATE (2.5 MG/3ML) 0.083% IN NEBU
2.5000 mg | INHALATION_SOLUTION | RESPIRATORY_TRACT | Status: DC | PRN
  Administered 2017-02-07: 2.5 mg via RESPIRATORY_TRACT
  Filled 2017-02-06: qty 3

## 2017-02-06 NOTE — Progress Notes (Signed)
PULMONARY / CRITICAL CARE MEDICINE   Name: ADILEE LEMME MRN: [id number] DOB: January 23, 1927    ADMISSION DATE:  02/01/2017 CONSULTATION DATE:  02/02/2017  REFERRING MD:  Dr. Maryan Rued   CHIEF COMPLAINT:  Hypoxia   BRIEF SUMMARY:   83 year old female with PMH of multiple falls, DVT (on coumadin stopped about 3 months ago), GERD, Dementia, Cytoma of lungs. Admitted 9/25 with progressive dyspnea, LE swelling at home. Family reports that patient lives with daughter and over the last year has been having multiple falls at home. Prior to admit, pt was eating lunch when she then began coughing and breathing heavily, throughout the day this began to worsen and EMS was called. When EMS arrived patient was placed on CPAP and given 3 nitro for hypertension. Upon arrival to ED patient was hypoxic with saturation 50-60% on CPAP with BP 111/91. Intubated in ED. ABG 7.2/47/171. PCCM asked to admit.    SUBJECTIVE: RN reports lab unable to obtain blood this am.  Daughter at bedside.  Notes patient has been swelling in her lower extremities past few weeks before admission.  Decreased UOP.    VITAL SIGNS: BP 132/89   Pulse 97   Temp 98.3 F (36.8 C) (Oral)   Resp (!) 26   Ht 5' (1.524 m)   Wt 97 lb 7.1 oz (44.2 kg)   SpO2 91%   BMI 19.03 kg/m   HEMODYNAMICS:    VENTILATOR SETTINGS: Vent Mode: PRVC FiO2 (%):  [40 %] 40 % Set Rate:  [15 bmp] 15 bmp Vt Set:  [400 mL] 400 mL PEEP:  [5 cmH20] 5 cmH20 Pressure Support:  [14 cmH20] 14 cmH20 Plateau Pressure:  [16 cmH20-26 cmH20] 26 cmH20  INTAKE / OUTPUT: I/O last 3 completed shifts: In: 6156 [I.V.:2583.5; NG/GT:722.5; IV Piggyback:2850] Out: 555 [Urine:455; Emesis/NG output:100]  PHYSICAL EXAMINATION: General: frail elderly female in NAD  HEENT: MM pink/moist, ETT PSY: intermittently anxious  Neuro: awakens to voice, alert, MAE CV: s1s2 rrr, loud 4/6 murmur heard at 2nd ISC RSB, 5th ICS MCL PULM: even/non-labored, lungs bilaterally exp  wheezing OJ:JKKX, non-tender, bsx4 active  Extremities: warm/dry, no edema  Skin: no rashes or lesions   LABS:  BMET  Recent Labs Lab 01/24/2017 2215 02/05/17 0245  NA 129* 130*  K 3.7 3.8  CL 102 101  CO2 17* 16*  BUN 14 12  CREATININE 0.98 0.86  GLUCOSE 205* 119*    Electrolytes  Recent Labs Lab 02/07/2017 2215 02/05/17 0245 02/05/17 1811  CALCIUM 7.9* 8.1*  --   MG  --  1.7 2.0  PHOS  --  3.7 3.1    CBC  Recent Labs Lab 02/03/2017 2121 02/05/17 0922  WBC 7.5 11.8*  HGB 13.8 10.1*  HCT 42.0 30.7*  PLT 160 190    Coag's  Recent Labs Lab 01/19/2017 2204  INR 1.16    Sepsis Markers  Recent Labs Lab 01/18/2017 2200 02/05/17 0245 02/05/17 0657  LATICACIDVEN 6.02* 3.2* 3.4*  PROCALCITON  --  0.42  --     ABG  Recent Labs Lab 01/11/2017 2200 02/05/17 0351 02/06/17 0355  PHART 7.236* 7.317* 7.460*  PCO2ART 47.1 37.4 25.6*  PO2ART 171.0* 69.0* 60.1*    Liver Enzymes No results for input(s): AST, ALT, ALKPHOS, BILITOT, ALBUMIN in the last 168 hours.  Cardiac Enzymes  Recent Labs Lab 02/05/17 0245 02/05/17 0922 02/05/17 1811  TROPONINI 0.59* 0.80* 0.59*    Glucose  Recent Labs Lab 02/05/17 1151 02/05/17 1525 02/05/17 2008  02/05/17 2329 02/06/17 0317 02/06/17 0804  GLUCAP 99 105* 116* 130* 158* 132*    Imaging Dg Chest Port 1 View  Result Date: 02/06/2017 CLINICAL DATA:  Respiratory failure. EXAM: PORTABLE CHEST 1 VIEW COMPARISON:  01/18/2017 . FINDINGS: Endotracheal tube and NG tube in stable position. Heart size stable. Interim near complete clearing of bilateral pulmonary infiltrates/edema and bilateral pleural effusion. Findings consistent with significant improvement of congestive heart failure. IMPRESSION: 1.  Lines and tubes stable position. 2. Interim near complete clearing of pulmonary edema bilateral pleural effusions. Findings consistent with significant improvement of congestive heart failure . Electronically Signed    By: Marcello Moores  Register   On: 02/06/2017 07:15     STUDIES:  CXR 9/25 >> small bilateral layering effusions, R>L. Cardiomegaly with central congestion / edema, consolidation at bases ECHO 9/26 >> LVEF 30-35%, severe aortic stenosis, LA mildly dilated, severe MR, PA peak 38, trivial pericardial effusion LE Duplex 9/26 >> negative   CULTURES: RVP 9/26 >> negative  Blood 9/26 >> GPR 1/2 >> BCID 9/25 >> staph species detected / MSSA Tracheal 9/26 >> U/A 9/26 >>   ANTIBIOTICS: Rocephin 9/25 >> Azithromycin 9/25 >>  SIGNIFICANT EVENTS: 9/25 Admit with SOB, intubated  LINES/TUBES: ETT 9/25 >> PIV   DISCUSSION: 83 year old female presents to ED with progressive hypoxia requiring intubation in the setting of bilateral infiltrates. DDx includes edema, PE, aspiration PNA.  Weaning 9/26. Need EOL clarification. Diprivan dc'd (hypotension)  ASSESSMENT / PLAN:  PULMONARY A: Acute Hypoxic Respiratory Failure in setting of Bilateral Infiltrates - suspect valvular disease / edema contributing factor, +/-ASP PNA vs CAP.  Doubt PE (wells score 7.5) with negative LE doppler. H/O DVT, Cytoma of Lungs  P:   PRVC 8cc/kg  Wean PEEP / FiO2 for sats > 90% Intermittent CXR  Duoneb Q6  LE doppler, ECHO as above  Given valvular disease, concern she will be difficult to liberate from mechanical ventilation.  Have discussed the concept of one way extubation with daughter  CARDIOVASCULAR A:  Shock - suspect septic  Elevated Troponin - suspect demand ischemia in setting of hypotension Severe Aortic Stenosis Severe Mitral Regurgitation  P:  ICU monitoring  ECHO as above Trend troponin D/C heparin  RENAL A:   AKI  Non-Gap Metabolic Acidosis   P:   Trend BMP / urinary output Replace electrolytes as indicated Avoid nephrotoxic agents, ensure adequate renal perfusion Decreased UOP  > monitor output  NS @ 61ml/hr  GASTROINTESTINAL A:   GERD ?Dysphagia  P:   NPO  PPI  TF per Nutrition   SLP evaluation once extubated  HEMATOLOGIC A:   H/O DVT - has been off coumadin for several months prior to admit  P:  Trend CBC  Discontinue heparin gtt  Monitor for bleeding, transfuse per ICU guidelines   INFECTIOUS A:   Shock - rule out sepsis, concern for possible aspiration Bilateral Infiltrates - likely edema, ?Aspiration PNA vs CAP   P:   Follow cultures as above  Empiric abx as above Trend PCT, lactic acid   ENDOCRINE A:   Hyperglycemia    P:   SSI   NEUROLOGIC A:   Sedation Needs  Dementia  P:   RASS goal: 0 to -1  Precedex gtt for sedation (did not tolerate propofol due to hypotension) PRN fentanyl for pain   FAMILY  - Updates: Daughter updated at bedside.  She herself just had a Mini mitral valve replacement 3 weeks ago.   -  Code Status: LCB  - Inter-disciplinary family meet or Palliative Care meeting due by: 02/11/2017   CC Time: 65 minutes  Noe Gens, NP-C [facility] Pgr: [phone] or if no answer [phone] 02/06/2017, 9:38 AM  Attending Note:  I have examined patient, reviewed labs, studies and notes. I have discussed the case with B Ollis, and I agree with the data and plans as amended above. 83 year old woman history of DVTs, severe aortic stenosis with an associated cardiomyopathy, severe MR. She presented with acute onset hypoxemic respiratory failure in the setting of bilateral infiltrates and effusions. She was mechanically ventilated. On eval she is a thin ill appearing woman, on MV and a bit agitated. Lungs coarse bilaterally. Heart regular with a 2/6 syst M. abd benign. Treating with empiric antibiotics. Given low likelihood for pulmonary embolism we will DC heparin drip today. She has tolerated some pressure support but not currently. Her valvular disease may make extubation difficult or impossible. I have explained this to her family. We will try to optimize her Cardiopulmonary status and then talk about extubation,  possibly with out any plans for reintubation and transition to comfort should she fail. Independent critical care time is 35 minutes.   Baltazar Apo, MD, PhD 02/06/2017, 2:55 PM [facility] [phone] or if no answer [phone]

## 2017-02-06 NOTE — Care Management Note (Signed)
Case Management Note Marvetta Gibbons RN, BSN Unit 4E-Case Manager-- 68M coverage [phone]  Patient Details  Name: Vanessa Pace MRN: [id number] Date of Birth: 03-May-1927  Subjective/Objective:   Pt admitted with  Acute Hypoxic Respiratory Failure in setting of ASP PNA vs CAP +/- Viral Source, ?PE - intubated in ED- currently on vent- 9/27                 Action/Plan: PTA pt lived at home with daughter- CM to follow for progression and d/c needs  Expected Discharge Date:                  Expected Discharge Plan:     In-House Referral:     Discharge planning Services  CM Consult  Post Acute Care Choice:    Choice offered to:     DME Arranged:    DME Agency:     HH Arranged:    [place] Agency:     Status of Service:  In process, will continue to follow  If discussed at Long Length of Stay Meetings, dates discussed:   Discharge Disposition:    Additional Comments:  Dawayne Patricia, RN 02/06/2017, 10:35 AM

## 2017-02-06 NOTE — Progress Notes (Signed)
[facility] MD notified of decrease urine output. Order for bladder scan placed, 19ml seen on bladder scanner. Will continue to monitor.

## 2017-02-06 NOTE — Progress Notes (Signed)
eLink Physician-Brief Progress Note Patient Name: Vanessa Pace DOB: 10/17/1926 MRN: [id number]   Date of Service  02/06/2017  HPI/Events of Note  RT found TV to be at 330 cc,  Was on 400 c last night when last ABG documented  eICU Interventions  Change to 8cc/kg = 360 cc Chk ABG in am     Intervention Category Intermediate Interventions: Other:  [name] V. 02/06/2017, 11:31 PM

## 2017-02-07 ENCOUNTER — Inpatient Hospital Stay (HOSPITAL_COMMUNITY): Payer: Medicare Other

## 2017-02-07 LAB — GLUCOSE, CAPILLARY
GLUCOSE-CAPILLARY: 109 mg/dL — AB (ref 65–99)
GLUCOSE-CAPILLARY: 122 mg/dL — AB (ref 65–99)
GLUCOSE-CAPILLARY: 255 mg/dL — AB (ref 65–99)
GLUCOSE-CAPILLARY: 270 mg/dL — AB (ref 65–99)
Glucose-Capillary: 147 mg/dL — ABNORMAL HIGH (ref 65–99)
Glucose-Capillary: 287 mg/dL — ABNORMAL HIGH (ref 65–99)
Glucose-Capillary: 81 mg/dL (ref 65–99)
Glucose-Capillary: 95 mg/dL (ref 65–99)

## 2017-02-07 LAB — CBC
HEMATOCRIT: 31.8 % — AB (ref 36.0–46.0)
HEMOGLOBIN: 10.5 g/dL — AB (ref 12.0–15.0)
MCH: 30.6 pg (ref 26.0–34.0)
MCHC: 33 g/dL (ref 30.0–36.0)
MCV: 92.7 fL (ref 78.0–100.0)
Platelets: 175 10*3/uL (ref 150–400)
RBC: 3.43 MIL/uL — ABNORMAL LOW (ref 3.87–5.11)
RDW: 14.4 % (ref 11.5–15.5)
WBC: 17.3 10*3/uL — ABNORMAL HIGH (ref 4.0–10.5)

## 2017-02-07 LAB — BASIC METABOLIC PANEL
Anion gap: 7 (ref 5–15)
BUN: 25 mg/dL — ABNORMAL HIGH (ref 6–20)
CHLORIDE: 105 mmol/L (ref 101–111)
CO2: 18 mmol/L — AB (ref 22–32)
CREATININE: 0.62 mg/dL (ref 0.44–1.00)
Calcium: 8.6 mg/dL — ABNORMAL LOW (ref 8.9–10.3)
GFR calc non Af Amer: >60 mL/min (ref >60)
GLUCOSE: 90 mg/dL (ref 65–99)
Potassium: 4.8 mmol/L (ref 3.5–5.1)
Sodium: 130 mmol/L — ABNORMAL LOW (ref 135–145)

## 2017-02-07 LAB — CULTURE, RESPIRATORY W GRAM STAIN: Culture: NORMAL

## 2017-02-07 LAB — BLOOD GAS, ARTERIAL
Acid-base deficit: 7 mmol/L — ABNORMAL HIGH (ref 0.0–2.0)
Bicarbonate: 18.3 mmol/L — ABNORMAL LOW (ref 20.0–28.0)
Drawn by: 31101
FIO2: 50
LHR: 15 {breaths}/min
MECHVT: 360 mL
O2 Saturation: 96.6 %
PATIENT TEMPERATURE: 97.5
PCO2 ART: 37.3 mmHg (ref 32.0–48.0)
PEEP: 5 cmH2O
pH, Arterial: 7.306 — ABNORMAL LOW (ref 7.350–7.450)
pO2, Arterial: 90 mmHg (ref 83.0–108.0)

## 2017-02-07 LAB — CULTURE, RESPIRATORY

## 2017-02-07 MED ORDER — FUROSEMIDE 10 MG/ML IJ SOLN
60.0000 mg | Freq: Once | INTRAMUSCULAR | Status: AC
  Administered 2017-02-07: 60 mg via INTRAVENOUS
  Filled 2017-02-07: qty 6

## 2017-02-07 MED ORDER — ORAL CARE MOUTH RINSE
15.0000 mL | Freq: Two times a day (BID) | OROMUCOSAL | Status: DC
  Administered 2017-02-08: 15 mL via OROMUCOSAL

## 2017-02-07 MED ORDER — SODIUM CHLORIDE 0.9 % IV SOLN
1.0000 mg/h | INTRAVENOUS | Status: DC
  Administered 2017-02-07: 2 mg/h via INTRAVENOUS
  Filled 2017-02-07: qty 10

## 2017-02-07 MED ORDER — INSULIN ASPART 100 UNIT/ML ~~LOC~~ SOLN
0.0000 [IU] | SUBCUTANEOUS | Status: DC
  Administered 2017-02-07: 2 [IU] via SUBCUTANEOUS
  Administered 2017-02-07: 8 [IU] via SUBCUTANEOUS
  Administered 2017-02-07: 2 [IU] via SUBCUTANEOUS
  Administered 2017-02-08: 8 [IU] via SUBCUTANEOUS

## 2017-02-07 MED ORDER — DEXMEDETOMIDINE HCL IN NACL 400 MCG/100ML IV SOLN
0.4000 ug/kg/h | INTRAVENOUS | Status: DC
  Administered 2017-02-07: 1 ug/kg/h via INTRAVENOUS
  Administered 2017-02-07: 0.6 ug/kg/h via INTRAVENOUS
  Filled 2017-02-07 (×2): qty 100

## 2017-02-07 MED ORDER — CHLORHEXIDINE GLUCONATE 0.12 % MT SOLN
15.0000 mL | Freq: Two times a day (BID) | OROMUCOSAL | Status: DC
  Administered 2017-02-08: 15 mL via OROMUCOSAL

## 2017-02-07 NOTE — Progress Notes (Signed)
200 ml of Fentanyl wasted into the sink. Damita Dunnings, RN witnessed.

## 2017-02-07 NOTE — Procedures (Signed)
Extubation Procedure Note  Patient Details:   Name: Vanessa Pace DOB: 12/20/1926 MRN: [id number]   Airway Documentation:     Evaluation  O2 sats: stable throughout Complications: No apparent complications Patient did tolerate procedure well. Bilateral Breath Sounds: Expiratory wheezes   No, SPO2 92% on 4L [name]  [name] 02/07/2017, 11:03 AM

## 2017-02-07 NOTE — Progress Notes (Signed)
PULMONARY / CRITICAL CARE MEDICINE   Name: Vanessa Pace MRN: [id number] DOB: [birth year]    ADMISSION DATE:  02/03/2017 CONSULTATION DATE:  02/07/2017  REFERRING MD:  Dr. Maryan Rued   CHIEF COMPLAINT:  Hypoxia   BRIEF SUMMARY:   83 year old female with PMH of multiple falls, DVT (on coumadin stopped about 3 months ago), GERD, Dementia, Cytoma of lungs. Admitted 9/25 with progressive dyspnea, LE swelling at home. Family reports that patient lives with daughter and over the last year has been having multiple falls at home. Prior to admit, pt was eating lunch when she then began coughing and breathing heavily, throughout the day this began to worsen and EMS was called. When EMS arrived patient was placed on CPAP and given 3 nitro for hypertension. Upon arrival to ED patient was hypoxic with saturation 50-60% on CPAP with BP 111/91. Intubated in ED. ABG 7.2/47/171. PCCM asked to admit.    SUBJECTIVE:  Tolerating pressure support 5 this morning on Precedex 0.5  VITAL SIGNS: BP 93/62   Pulse 86   Temp (!) 97.4 F (36.3 C) (Oral)   Resp 15   Ht 5' (1.524 m)   Wt 46.6 kg (102 lb 11.8 oz)   SpO2 (!) 88%   BMI 20.06 kg/m   HEMODYNAMICS:    VENTILATOR SETTINGS: Vent Mode: CPAP;PSV FiO2 (%):  [40 %-50 %] 40 % Set Rate:  [15 bmp] 15 bmp Vt Set:  [330 mL-400 mL] 360 mL PEEP:  [5 cmH20] 5 cmH20 Pressure Support:  [5 cmH20] 5 cmH20 Plateau Pressure:  [10 cmH20-20 cmH20] 10 cmH20  INTAKE / OUTPUT: I/O last 3 completed shifts: In: 4984.3 [I.V.:3004.3; Other:30; NG/GT:1350; IV [id number]:563] Out: 47 [Urine:292]  PHYSICAL EXAMINATION: General: Frail elderly woman, ill-appearing, no distress HEENT: Endotracheal tube in place, no oral lesions PSY: Calm today on Precedex Neuro: Awake, follows commands, moves extremities CV: Regular, 3/6 systolic murmur PULM: Distant breath sounds, very soft end expiratory wheeze GI: Soft, nontender, positive bowel sounds Extremities: No edema Skin:  Multiple bruises, no rash   LABS:  BMET  Recent Labs Lab 02/05/17 0245 02/06/17 1137 02/07/17 0527  NA 130* 129* 130*  K 3.8 3.8 4.8  CL 101 100* 105  CO2 16* 19* 18*  BUN 12 18 25*  CREATININE 0.86 0.59 0.62  GLUCOSE 119* 121* 90    Electrolytes  Recent Labs Lab 02/05/17 0245 02/05/17 1811 02/06/17 1137 02/07/17 0527  CALCIUM 8.1*  --  8.4* 8.6*  MG 1.7 2.0 1.9  --   PHOS 3.7 3.1 2.6  --     CBC  Recent Labs Lab 02/05/17 0922 02/06/17 1137 02/07/17 0527  WBC 11.8* 11.6* 17.3*  HGB 10.1* 9.3* 10.5*  HCT 30.7* 27.8* 31.8*  PLT 190 169 175    Coag's  Recent Labs Lab 01/25/2017 2204  INR 1.16    Sepsis Markers  Recent Labs Lab 01/20/2017 2200 02/05/17 0245 02/05/17 0657 02/06/17 1137  LATICACIDVEN 6.02* 3.2* 3.4*  --   PROCALCITON  --  0.42  --  1.04    ABG  Recent Labs Lab 02/05/17 0351 02/06/17 0355 02/07/17 0230  PHART 7.317* 7.460* 7.306*  PCO2ART 37.4 25.6* 37.3  PO2ART 69.0* 60.1* 90.0    Liver Enzymes No results for input(s): AST, ALT, ALKPHOS, BILITOT, ALBUMIN in the last 168 hours.  Cardiac Enzymes  Recent Labs Lab 02/05/17 0245 02/05/17 0922 02/05/17 1811  TROPONINI 0.59* 0.80* 0.59*    Glucose  Recent Labs Lab 02/06/17 1551 02/06/17  2057 02/07/17 0009 02/07/17 0125 02/07/17 0340 02/07/17 0726  GLUCAP 131* 108* 287* 255* 122* 81    Imaging Dg Chest Port 1 View  Result Date: 02/07/2017 CLINICAL DATA:  Respiratory failure. EXAM: PORTABLE CHEST 1 VIEW COMPARISON:  02/06/2017 . FINDINGS: Endotracheal tube and NG tube in stable position. Heart size stable. New onset of diffuse severe bilateral pulmonary infiltrates/edema and bilateral pleural effusions. Findings most consistent with CHF. No pneumothorax. Prior vertebroplasty. IMPRESSION: 1. Endotracheal tube and NG tube in stable position. 2. New onset of diffuse bilateral pulmonary infiltrates/ edema and bilateral pleural effusions. Findings consistent with  CHF. Electronically Signed   By: Marcello Moores  Register   On: 02/07/2017 06:21     STUDIES:  CXR 9/25 >> small bilateral layering effusions, R>L. Cardiomegaly with central congestion / edema, consolidation at bases ECHO 9/26 >> LVEF 30-35%, severe aortic stenosis, LA mildly dilated, severe MR, PA peak 38, trivial pericardial effusion LE Duplex 9/26 >> negative   CULTURES: RVP 9/26 >> negative  Blood 9/26 >> GPR 1/2 >> Bacillus species  BCID 9/25 >> staph species detected / MSSA Tracheal 9/26 >> normal flora U/A 9/26 >>   ANTIBIOTICS: Rocephin 9/25 >> Azithromycin 9/25 >>  SIGNIFICANT EVENTS: 9/25 Admit with SOB, intubated  LINES/TUBES: ETT 9/25 >> 9/28 PIV   DISCUSSION: 83 year old female presents to ED with progressive hypoxia requiring intubation in the setting of bilateral infiltrates. DDx includes edema, PE, aspiration PNA.  Weaning on Precedex  ASSESSMENT / PLAN:  PULMONARY A: Acute Hypoxic Respiratory Failure in setting of Bilateral Infiltrates - suspect valvular disease / edema contributing factor, +/-ASP PNA vs CAP.  Doubt PE (wells score 7.5) with negative LE doppler. H/O DVT, Cytoma of Lungs  P:   Tolerating pressure support currently. She is marginal for extubation but wishes have been to avoid a prolonged intubation. I believe she may be able to be successfully extubated today. We will try this. If she does not tolerate then we will transition to comfort care based on my conversations with her and with her daughter. Continue duo nebs ordered  CARDIOVASCULAR A:  Shock - suspect septic plus cardiogenic Elevated Troponin - suspect demand ischemia in setting of hypotension Severe Aortic Stenosis Severe Mitral Regurgitation  P:  Off heparin Attempt to keep I=O  RENAL A:   AKI  Non-Gap Metabolic Acidosis   P:   Follow BMP, urine output Replace elect size as indicated KVO IV fluids  GASTROINTESTINAL A:   GERD ?Dysphagia  P:   Nothing by mouth, swallowing  evaluation if she can tolerate PPI   HEMATOLOGIC A:   H/O DVT - has been off coumadin for several months prior to admit  P:  Follow CBC, off heparin Transfuse per ICU guidelines  INFECTIOUS A:   Shock - rule out sepsis, concern for possible aspiration Bilateral Infiltrates - likely edema, ?Aspiration PNA vs CAP   P:   Continue empiric anabiotic's for suspected CAP, plan 7 days  ENDOCRINE A:   Hyperglycemia    P:   SSI   NEUROLOGIC A:   Sedation Needs  Dementia  P:   RASS goal: 0 Continue Precedex even post extubation PRN fentanyl for pain   FAMILY  - Updates: Updated daughter and son-in-law at bedside. We will try for success was to patient but will not reintubate if she fails. Transition to comfort if she develops Crestor distress. - Code Status: NCB - Inter-disciplinary family meet or Palliative Care meeting due by: 02/11/2017  Independent critical care time is 40 minutes.   Baltazar Apo, MD, PhD 02/07/2017, 11:28 AM [facility] [phone] or if no answer [phone]

## 2017-02-08 LAB — BASIC METABOLIC PANEL
Anion gap: 8 (ref 5–15)
BUN: 32 mg/dL — AB (ref 6–20)
CHLORIDE: 100 mmol/L — AB (ref 101–111)
CO2: 22 mmol/L (ref 22–32)
CREATININE: 1.1 mg/dL — AB (ref 0.44–1.00)
Calcium: 9.1 mg/dL (ref 8.9–10.3)
GFR calc Af Amer: 51 mL/min — ABNORMAL LOW (ref >60)
GFR calc non Af Amer: 44 mL/min — ABNORMAL LOW (ref >60)
GLUCOSE: 119 mg/dL — AB (ref 65–99)
POTASSIUM: 4.6 mmol/L (ref 3.5–5.1)
SODIUM: 130 mmol/L — AB (ref 135–145)

## 2017-02-08 LAB — CBC
HEMATOCRIT: 35.1 % — AB (ref 36.0–46.0)
Hemoglobin: 11.2 g/dL — ABNORMAL LOW (ref 12.0–15.0)
MCH: 29.8 pg (ref 26.0–34.0)
MCHC: 31.9 g/dL (ref 30.0–36.0)
MCV: 93.4 fL (ref 78.0–100.0)
PLATELETS: 262 10*3/uL (ref 150–400)
RBC: 3.76 MIL/uL — ABNORMAL LOW (ref 3.87–5.11)
RDW: 14.2 % (ref 11.5–15.5)
WBC: 15.8 10*3/uL — ABNORMAL HIGH (ref 4.0–10.5)

## 2017-02-08 LAB — GLUCOSE, CAPILLARY: Glucose-Capillary: 111 mg/dL — ABNORMAL HIGH (ref 65–99)

## 2017-02-08 NOTE — Progress Notes (Signed)
Pt to 6N02 from MICU with foley and O2 in place.  Morphine drip infusing at 1. Pt appears comfortable.  Comfort cart ordered for family and family oriented to dept.

## 2017-02-08 NOTE — Progress Notes (Signed)
PT SATs 80%. [facility] dr made aware, no new orders. Patient appears to be comfortable, no distress. Family at bedside, will continue to monitor.

## 2017-02-08 NOTE — Progress Notes (Signed)
PULMONARY / CRITICAL CARE MEDICINE   Name: Vanessa Pace MRN: [id number] DOB: June 18, 1926    ADMISSION DATE:  01/22/2017 CONSULTATION DATE:  02/01/2017  REFERRING MD:  Dr. Maryan Rued   CHIEF COMPLAINT:  Hypoxia   BRIEF SUMMARY:   83 year old female with PMH of multiple falls, DVT (on coumadin stopped about 3 months ago), GERD, Dementia, Cytoma of lungs. Admitted 9/25 with progressive dyspnea, LE swelling at home. Family reports that patient lives with daughter and over the last year has been having multiple falls at home. Prior to admit, pt was eating lunch when she then began coughing and breathing heavily, throughout the day this began to worsen and EMS was called. When EMS arrived patient was placed on CPAP and given 3 nitro for hypertension. Upon arrival to ED patient was hypoxic with saturation 50-60% on CPAP with BP 111/91. Intubated in ED. ABG 7.2/47/171. PCCM asked to admit.    SUBJECTIVE:  Hypoxic but appears comfortable Precedex transitioned to morphine drip She still has a cool mist mask in place  VITAL SIGNS: BP 99/68   Pulse 91   Temp 98.1 F (36.7 C) (Axillary)   Resp 12   Ht 5' (1.524 m)   Wt 46.6 kg (102 lb 11.8 oz)   SpO2 (!) 84%   BMI 20.06 kg/m   HEMODYNAMICS:    VENTILATOR SETTINGS: FiO2 (%):  [90 %] 90 %  INTAKE / OUTPUT: I/O last 3 completed shifts: In: 2872.2 [I.V.:1657.2; Other:30; NG/GT:585; IV Piggyback:600] Out: 355 [Urine:355]  PHYSICAL EXAMINATION: General: Frail elderly woman, ill-appearing, poorly responsive HEENT: Oropharynx moist some upper airway noises PSY: poorly responsive to voice or pain Neuro: does not wake up or interact. CV: regular, 3/6 systolic murmur PULM: coarse breath sounds bilaterally GI: benign Extremities: No edema Skin: multiple bruises present   LABS:  BMET  Recent Labs Lab 02/06/17 1137 02/07/17 0527 02/08/17 0226  NA 129* 130* 130*  K 3.8 4.8 4.6  CL 100* 105 100*  CO2 19* 18* 22  BUN 18 25* 32*   CREATININE 0.59 0.62 1.10*  GLUCOSE 121* 90 119*    Electrolytes  Recent Labs Lab 02/05/17 0245 02/05/17 1811 02/06/17 1137 02/07/17 0527 02/08/17 0226  CALCIUM 8.1*  --  8.4* 8.6* 9.1  MG 1.7 2.0 1.9  --   --   PHOS 3.7 3.1 2.6  --   --     CBC  Recent Labs Lab 02/06/17 1137 02/07/17 0527 02/08/17 0226  WBC 11.6* 17.3* 15.8*  HGB 9.3* 10.5* 11.2*  HCT 27.8* 31.8* 35.1*  PLT 169 175 262    Coag's  Recent Labs Lab 01/26/2017 2204  INR 1.16    Sepsis Markers  Recent Labs Lab 01/16/2017 2200 02/05/17 0245 02/05/17 0657 02/06/17 1137  LATICACIDVEN 6.02* 3.2* 3.4*  --   PROCALCITON  --  0.42  --  1.04    ABG  Recent Labs Lab 02/05/17 0351 02/06/17 0355 02/07/17 0230  PHART 7.317* 7.460* 7.306*  PCO2ART 37.4 25.6* 37.3  PO2ART 69.0* 60.1* 90.0    Liver Enzymes No results for input(s): AST, ALT, ALKPHOS, BILITOT, ALBUMIN in the last 168 hours.  Cardiac Enzymes  Recent Labs Lab 02/05/17 0245 02/05/17 0922 02/05/17 1811  TROPONINI 0.59* 0.80* 0.59*    Glucose  Recent Labs Lab 02/07/17 0726 02/07/17 1146 02/07/17 1546 02/07/17 2010 02/07/17 2330 02/08/17 0320  GLUCAP 81 109* 147* 95 270* 111*    Imaging No results found.   STUDIES:  CXR 9/25 >>  small bilateral layering effusions, R>L. Cardiomegaly with central congestion / edema, consolidation at bases ECHO 9/26 >> LVEF 30-35%, severe aortic stenosis, LA mildly dilated, severe MR, PA peak 38, trivial pericardial effusion LE Duplex 9/26 >> negative   CULTURES: RVP 9/26 >> negative  Blood 9/26 >> GPR 1/2 >> Bacillus species  BCID 9/25 >> staph species detected / MSSA Tracheal 9/26 >> normal flora U/A 9/26 >>   ANTIBIOTICS: Rocephin 9/25 >> 9/29/ Azithromycin 9/25 >> 9/29  SIGNIFICANT EVENTS: 9/25 Admit with SOB, intubated  LINES/TUBES: ETT 9/25 >> 9/28 PIV   DISCUSSION: 83 year old female presents to ED with progressive hypoxia requiring intubation in the setting  of bilateral infiltrates. DDx includes edema, PE, aspiration PNA.  Weaning on Precedex  ASSESSMENT / PLAN:  PULMONARY A: Acute Hypoxic Respiratory Failure in setting of Bilateral Infiltrates - suspect valvular disease / edema contributing factor, +/-ASP PNA vs CAP.  Doubt PE (wells score 7.5) with negative LE doppler. H/O DVT, Cytoma of Lungs  P:   Extubated on 9/28. She has had some upper airway noise, difficulty with secretions. She also showed evidence for increased work of breathing and evolving respiratory distress. I placed her on morphine drip for comfort. We will discontinue invasive testing, extraneous medications. Transfer to full comfort care  CARDIOVASCULAR A:  Shock - suspect septic plus cardiogenic Elevated Troponin - suspect demand ischemia in setting of hypotension Severe Aortic Stenosis Severe Mitral Regurgitation  P:  Off heparin  RENAL A:   AKI  Non-Gap Metabolic Acidosis   P:   No more laboratory testing KVO IV fluids  GASTROINTESTINAL A:   GERD ?Dysphagia  P:   NPO Stop protonix  HEMATOLOGIC A:   H/O DVT - has been off coumadin for several months prior to admit  P:  no further labs  INFECTIOUS A:   Shock - rule out sepsis, concern for possible aspiration Bilateral Infiltrates - likely edema, ?Aspiration PNA vs CAP   P:   Discontinue antibiotics on 9/29  ENDOCRINE A:   Hyperglycemia    P:   Stop CBG, sliding scale insulin  NEUROLOGIC A:   Sedation Needs  Dementia  P:   RASS goal: 0 Continue morphine for comfort  FAMILY  - Updates: updated daughter and son-in-law 9/29. Explained that she is failing and that we will transition to full comfort. They understand and agree.  - Code Status: NCB - Inter-disciplinary family meet or Palliative Care meeting due by: 02/11/2017  Plan transfer to palliative bed, PCCM service.   Baltazar Apo, MD, PhD 02/08/2017, 11:15 AM [facility] [phone] or if no answer  [phone]

## 2017-02-09 LAB — CULTURE, BLOOD (ROUTINE X 2)
CULTURE: NO GROWTH
SPECIAL REQUESTS: ADEQUATE

## 2017-02-10 NOTE — Progress Notes (Signed)
Pt without pulse or respirations. TOD 0915. Confirmed by myself and Cameron Sprang, RN. Family at bedside. Attending notified. CDS notified.

## 2017-02-10 NOTE — Progress Notes (Addendum)
Morphine 160mg  wasted in sink. Witnessed by Cameron Sprang, RN.

## 2017-02-10 NOTE — Discharge Planning (Addendum)
[facility] here to pick up pt at 1430.

## 2017-02-10 NOTE — Progress Notes (Signed)
Witnessed [employer] wasted MS 158ml.

## 2017-02-10 DEATH — deceased

## 2017-02-11 ENCOUNTER — Telehealth: Payer: Self-pay

## 2017-02-11 NOTE — Telephone Encounter (Signed)
On 02/11/2017 I received a d/c from [facility] (original). The d/c is for cremation. The patient is a patient of Doctor Byrum. The d/c will be taken to Pulmonary Unit @ [facility] this pm for signature.  On 02/12/2017 I received the d/c back from Doctor Byrum. I got the d/c ready and called the funeral home to let them know the d/c is ready for pickup. I also faxed a copy to the funeral home per the funeral home request.

## 2017-03-13 NOTE — Discharge Summary (Signed)
PULMONARY / CRITICAL CARE MEDICINE DEATH SUMMARY   Name: Vanessa Pace MRN: [id number] DOB: Aug 28, 1926    ADMISSION DATE:  02-19-17 CONSULTATION DATE:  Feb 19, 2017 DATE OF DEATH: 02/24/2017   REFERRING MD:  Dr. Maryan Rued   CHIEF COMPLAINT:  Hypoxia    FINAL CAUSE OF DEATH Acute respiratory failure  SECONDARY CAUSES OF DEATH Bilateral pulmonary infiltrates Probable aspiration pneumonia Cardiogenic pulmonary edema Severe aortic stenosis Severe mitral regurgitation Septic shock Possible Bacillus sp. Bacteremia, suspect culture contaminant  Probable coexisting cardiogenic shock Stress (type 2) NSTEMI Acute renal insufficiency Suspected dysphagia Protein calorie malnutrition Acute toxic metabolic encephalopathy Dementia Hyperglycemia Hx of DVT   Hospital course:   83 year old female with PMH of multiple falls, DVT (on coumadin, stopped about 3 prior to admission), GERD, Dementia, severe aortic stenosis, mitral regurgitation. Admitted February 20, 2023 with progressive dyspnea, LE swelling at home. Family reports that patient was eating lunch when she then began coughing and breathing heavily, throughout the day this began to worsen and EMS was called. When EMS arrived patient was placed on CPAP and given 3 nitro for hypertension. Upon arrival to ED patient was hypoxic with saturation 50-60% on CPAP with BP 111/91. Intubated in ED. ABG 7.2/47/171. It was suspected that she had ex[perienced an aspiration event resulting in bilateral pneumonias, superimposed cardiogenic edema due to her AS and MR. She was treated with antibiotics, pressors. Respiratory cultures were negative, blood cx grew bacillus species, clinical significance unclear. Agitation was managed with precedex. On 9/28 she was tolerating some PSV. Given her overall prognosis and her stated wishes to avoid a prolonged period of extraordinary support, decision was made to extubate for success 9/28 but without any plans to reintubate should  she fail. She tolerated extubation for several hours but then evolved progressive hypoxemia, dyspnea. It was clear that she was not going to rebound and she was transitioned to full comfort measures on 02-24-23. She died on 02/24/17.    Baltazar Apo, MD, PhD 02/21/2017, 2:54 PM [facility] [phone] or if no answer [phone]

## 2018-09-25 IMAGING — DX DG KNEE COMPLETE 4+V*L*
4 series · 4 of 4 positions shown · non-contrast
Comparison: 06/13/2016

CLINICAL DATA: Recent fall with left lower leg pain, initial
encounter

EXAM:
LEFT KNEE - COMPLETE 4+ VIEW

[knee ap]
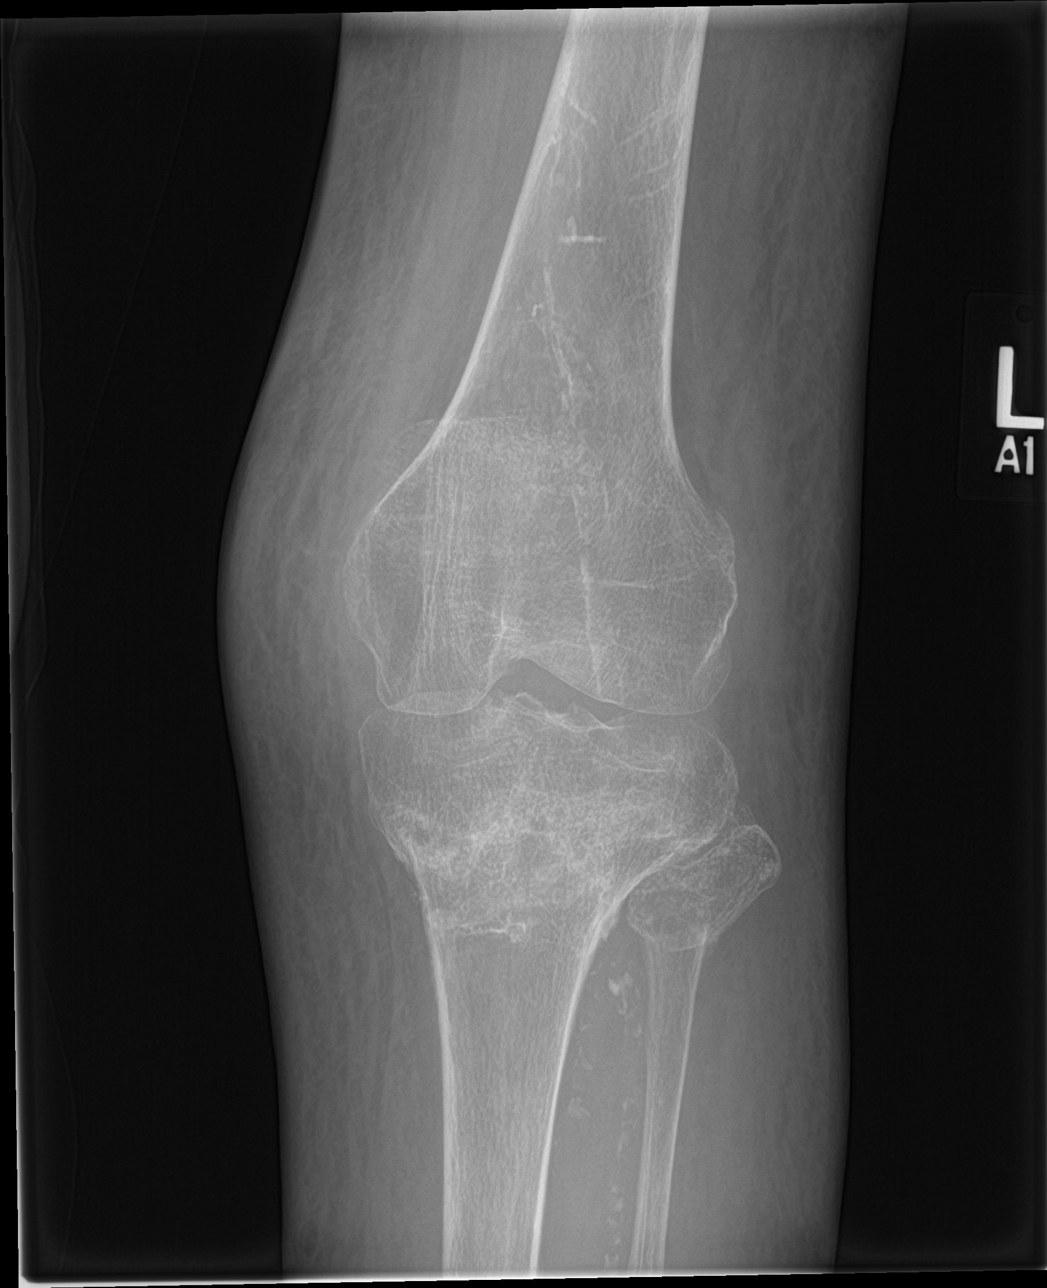

[knee sunrise]
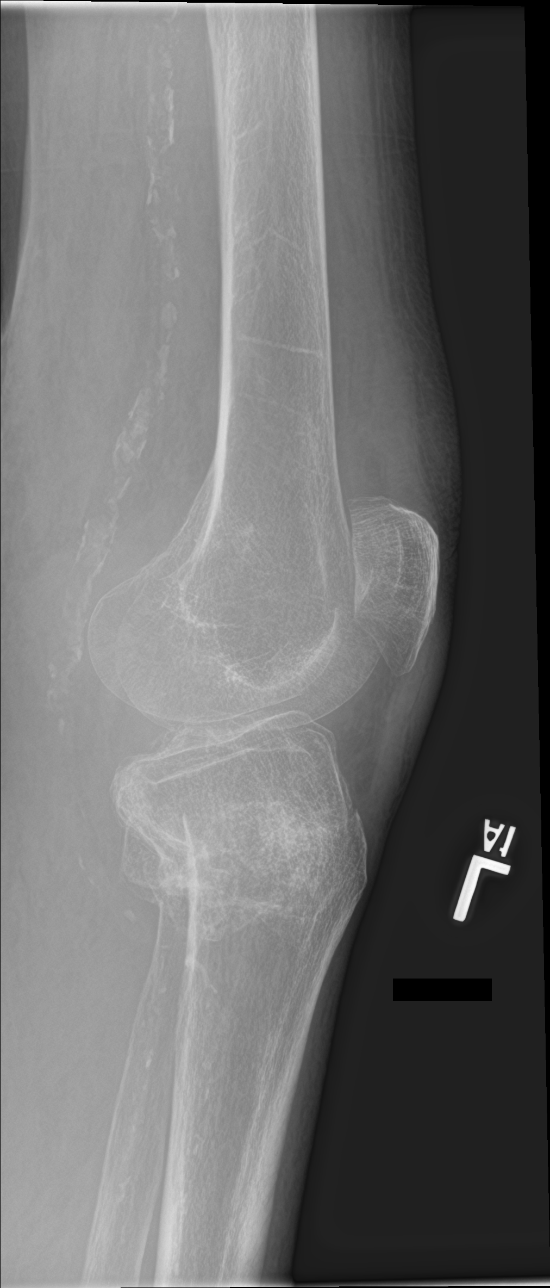

[knee obl (1 of 2)]
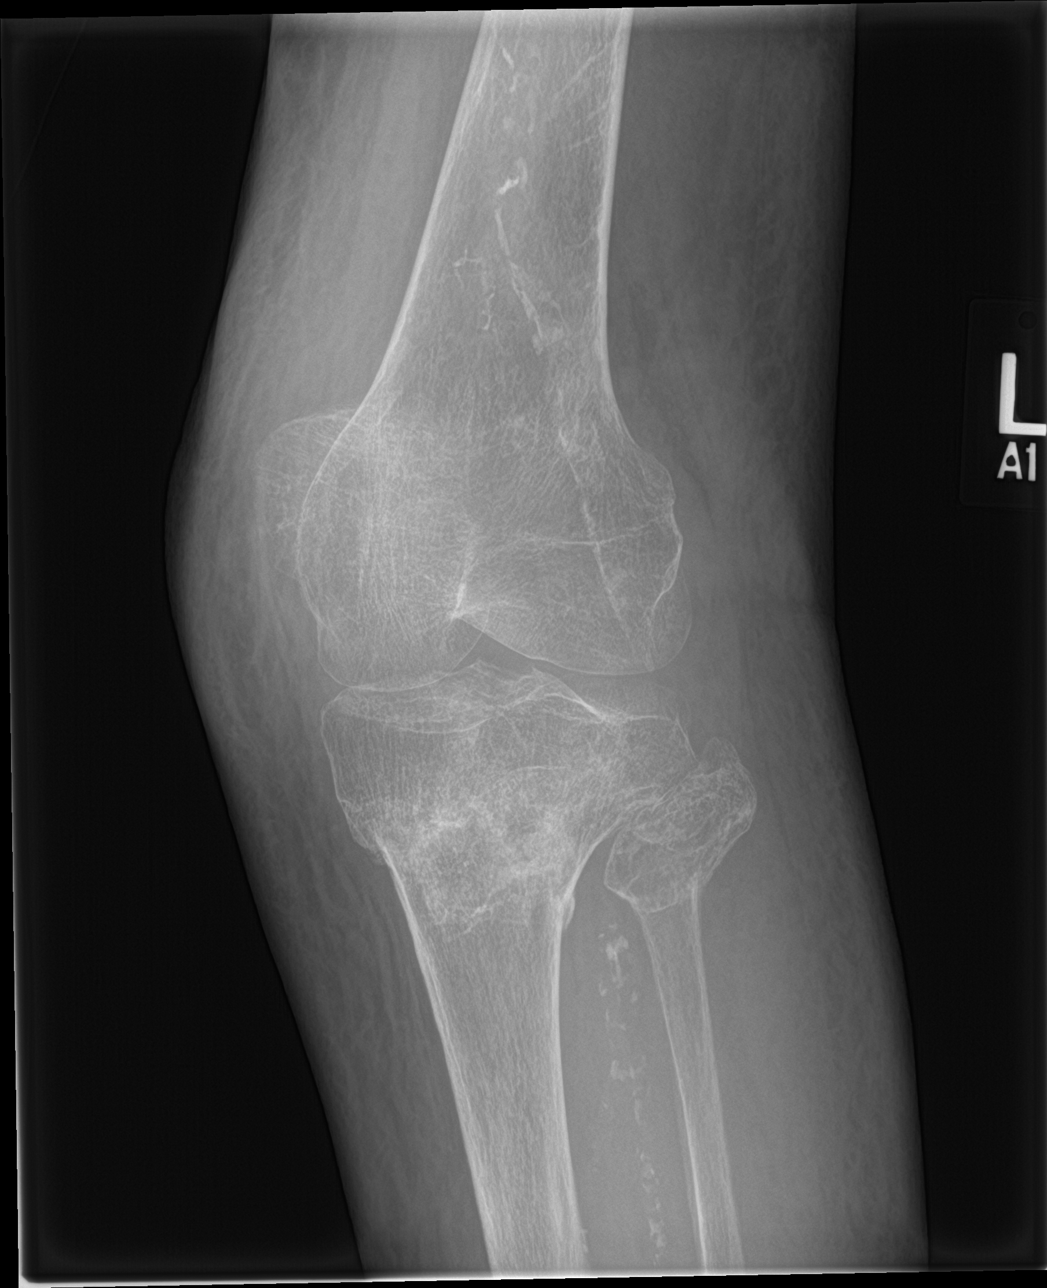

[knee obl (2 of 2)]
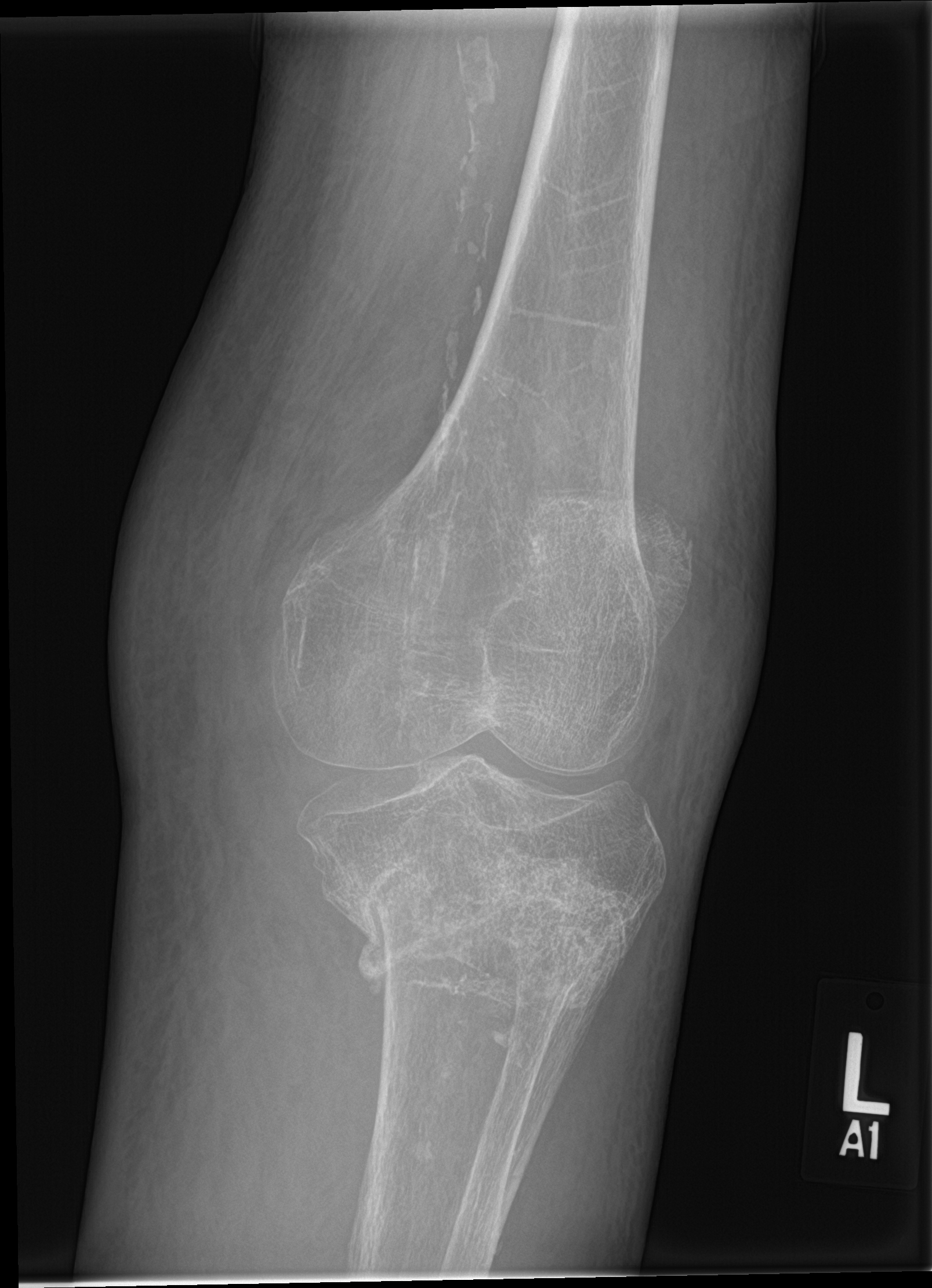

[4 of 4 positions shown; findings below may reference images not displayed]

FINDINGS: Previously seen fractures in the proximal tibia and proximal fibula
are again identified. Some impaction is noted at both fracture
sites. Some mild posterior angulation of the distal tibia and fibula
with respect to the proximal fracture fragments is noted. No joint
effusion is seen.
IMPRESSION: Previous proximal tibial and fibular fractures with some impaction
and mild angulation when compared with the prior exam. No definitive
acute abnormality is seen.
